# Patient Record
Sex: Male | Born: 1960
Health system: Southern US, Community
[De-identification: ages and names within clinical notes are randomized; demographics above are authoritative.]

## PROBLEM LIST (undated history)

## (undated) DIAGNOSIS — I10 Essential (primary) hypertension: Secondary | ICD-10-CM

## (undated) DIAGNOSIS — G5600 Carpal tunnel syndrome, unspecified upper limb: Secondary | ICD-10-CM

## (undated) DIAGNOSIS — M1990 Unspecified osteoarthritis, unspecified site: Secondary | ICD-10-CM

## (undated) DIAGNOSIS — E119 Type 2 diabetes mellitus without complications: Secondary | ICD-10-CM

## (undated) DIAGNOSIS — K219 Gastro-esophageal reflux disease without esophagitis: Secondary | ICD-10-CM

## (undated) DIAGNOSIS — J449 Chronic obstructive pulmonary disease, unspecified: Secondary | ICD-10-CM

## (undated) DIAGNOSIS — I509 Heart failure, unspecified: Secondary | ICD-10-CM

## (undated) HISTORY — PX: LEG SURGERY: SHX1003

---

## 2014-04-14 ENCOUNTER — Encounter: Payer: Self-pay | Admitting: Physical Medicine & Rehabilitation

## 2017-05-15 ENCOUNTER — Emergency Department (HOSPITAL_COMMUNITY)
Admission: EM | Admit: 2017-05-15 | Discharge: 2017-05-15 | Disposition: A | Discharge status: Home / Self Care | Payer: Self-pay | Attending: Emergency Medicine | Admitting: Emergency Medicine

## 2017-05-15 ENCOUNTER — Encounter (HOSPITAL_COMMUNITY): Payer: Self-pay | Admitting: *Deleted

## 2017-05-15 DIAGNOSIS — R5383 Other fatigue: Secondary | ICD-10-CM | POA: Insufficient documentation

## 2017-05-15 DIAGNOSIS — Z5321 Procedure and treatment not carried out due to patient leaving prior to being seen by health care provider: Secondary | ICD-10-CM | POA: Insufficient documentation

## 2017-05-15 HISTORY — DX: Essential (primary) hypertension: I10

## 2017-05-15 NOTE — ED Notes (Signed)
Informed pt of process, delay and wait time. Pt decided to leave after triage

## 2017-05-15 NOTE — ED Triage Notes (Signed)
Pt c/o fever, dizziness, and fatigue for 2 weeks. Was seen at [facility] yesterday and diagnosed with a UTI. Pt reports taking antibiotics as prescribed.

## 2019-07-21 DIAGNOSIS — J189 Pneumonia, unspecified organism: Secondary | ICD-10-CM

## 2019-07-21 DIAGNOSIS — Z03818 Encounter for observation for suspected exposure to other biological agents ruled out: Secondary | ICD-10-CM

## 2019-07-21 DIAGNOSIS — R0902 Hypoxemia: Secondary | ICD-10-CM

## 2019-07-21 DIAGNOSIS — J90 Pleural effusion, not elsewhere classified: Secondary | ICD-10-CM

## 2019-07-21 DIAGNOSIS — R079 Chest pain, unspecified: Secondary | ICD-10-CM

## 2019-07-21 DIAGNOSIS — I5033 Acute on chronic diastolic (congestive) heart failure: Secondary | ICD-10-CM

## 2019-07-21 DIAGNOSIS — I10 Essential (primary) hypertension: Secondary | ICD-10-CM

## 2019-10-12 DIAGNOSIS — J9601 Acute respiratory failure with hypoxia: Secondary | ICD-10-CM

## 2019-10-12 DIAGNOSIS — E785 Hyperlipidemia, unspecified: Secondary | ICD-10-CM

## 2019-10-12 DIAGNOSIS — I10 Essential (primary) hypertension: Secondary | ICD-10-CM

## 2020-08-08 ENCOUNTER — Emergency Department (HOSPITAL_COMMUNITY): Payer: Medicaid Other

## 2020-08-08 ENCOUNTER — Inpatient Hospital Stay (HOSPITAL_COMMUNITY)
Admission: EM | Admit: 2020-08-08 | Discharge: 2020-08-10 | DRG: 291 | Disposition: A | Discharge status: Home / Self Care | Payer: Medicaid Other | Attending: Internal Medicine | Admitting: Internal Medicine

## 2020-08-08 ENCOUNTER — Other Ambulatory Visit: Payer: Self-pay

## 2020-08-08 ENCOUNTER — Encounter (HOSPITAL_COMMUNITY): Payer: Self-pay | Admitting: Pediatrics

## 2020-08-08 DIAGNOSIS — I110 Hypertensive heart disease with heart failure: Principal | ICD-10-CM | POA: Diagnosis present

## 2020-08-08 DIAGNOSIS — R0600 Dyspnea, unspecified: Secondary | ICD-10-CM

## 2020-08-08 DIAGNOSIS — Z20822 Contact with and (suspected) exposure to covid-19: Secondary | ICD-10-CM | POA: Diagnosis present

## 2020-08-08 DIAGNOSIS — E785 Hyperlipidemia, unspecified: Secondary | ICD-10-CM | POA: Diagnosis present

## 2020-08-08 DIAGNOSIS — J449 Chronic obstructive pulmonary disease, unspecified: Secondary | ICD-10-CM | POA: Diagnosis present

## 2020-08-08 DIAGNOSIS — I209 Angina pectoris, unspecified: Secondary | ICD-10-CM | POA: Diagnosis present

## 2020-08-08 DIAGNOSIS — G8929 Other chronic pain: Secondary | ICD-10-CM | POA: Diagnosis present

## 2020-08-08 DIAGNOSIS — J440 Chronic obstructive pulmonary disease with acute lower respiratory infection: Secondary | ICD-10-CM | POA: Diagnosis present

## 2020-08-08 DIAGNOSIS — J209 Acute bronchitis, unspecified: Secondary | ICD-10-CM | POA: Diagnosis present

## 2020-08-08 DIAGNOSIS — Z7982 Long term (current) use of aspirin: Secondary | ICD-10-CM | POA: Diagnosis not present

## 2020-08-08 DIAGNOSIS — R61 Generalized hyperhidrosis: Secondary | ICD-10-CM | POA: Diagnosis present

## 2020-08-08 DIAGNOSIS — Z79899 Other long term (current) drug therapy: Secondary | ICD-10-CM | POA: Diagnosis not present

## 2020-08-08 DIAGNOSIS — F17210 Nicotine dependence, cigarettes, uncomplicated: Secondary | ICD-10-CM | POA: Diagnosis present

## 2020-08-08 DIAGNOSIS — I160 Hypertensive urgency: Secondary | ICD-10-CM | POA: Diagnosis present

## 2020-08-08 DIAGNOSIS — I10 Essential (primary) hypertension: Secondary | ICD-10-CM

## 2020-08-08 DIAGNOSIS — I509 Heart failure, unspecified: Secondary | ICD-10-CM

## 2020-08-08 DIAGNOSIS — Z8249 Family history of ischemic heart disease and other diseases of the circulatory system: Secondary | ICD-10-CM | POA: Diagnosis not present

## 2020-08-08 DIAGNOSIS — M549 Dorsalgia, unspecified: Secondary | ICD-10-CM | POA: Diagnosis present

## 2020-08-08 DIAGNOSIS — I2510 Atherosclerotic heart disease of native coronary artery without angina pectoris: Secondary | ICD-10-CM | POA: Diagnosis present

## 2020-08-08 DIAGNOSIS — Z720 Tobacco use: Secondary | ICD-10-CM | POA: Diagnosis present

## 2020-08-08 DIAGNOSIS — R079 Chest pain, unspecified: Secondary | ICD-10-CM

## 2020-08-08 DIAGNOSIS — Z23 Encounter for immunization: Secondary | ICD-10-CM | POA: Diagnosis not present

## 2020-08-08 DIAGNOSIS — I77810 Thoracic aortic ectasia: Secondary | ICD-10-CM | POA: Diagnosis present

## 2020-08-08 DIAGNOSIS — I5043 Acute on chronic combined systolic (congestive) and diastolic (congestive) heart failure: Secondary | ICD-10-CM | POA: Diagnosis present

## 2020-08-08 HISTORY — DX: Angina pectoris, unspecified: I20.9

## 2020-08-08 HISTORY — DX: Tobacco use: Z72.0

## 2020-08-08 HISTORY — DX: Heart failure, unspecified: I50.9

## 2020-08-08 HISTORY — DX: Chronic obstructive pulmonary disease, unspecified: J44.9

## 2020-08-08 HISTORY — DX: Essential (primary) hypertension: I10

## 2020-08-08 LAB — TROPONIN I (HIGH SENSITIVITY)
Troponin I (High Sensitivity): 41 ng/L — ABNORMAL HIGH (ref <18)
Troponin I (High Sensitivity): 44 ng/L — ABNORMAL HIGH (ref <18)

## 2020-08-08 LAB — CBC
HCT: 45.4 % (ref 39.0–52.0)
Hemoglobin: 15 g/dL (ref 13.0–17.0)
MCH: 30.7 pg (ref 26.0–34.0)
MCHC: 33 g/dL (ref 30.0–36.0)
MCV: 93 fL (ref 80.0–100.0)
Platelets: 152 10*3/uL (ref 150–400)
RBC: 4.88 MIL/uL (ref 4.22–5.81)
RDW: 13.5 % (ref 11.5–15.5)
WBC: 9.3 10*3/uL (ref 4.0–10.5)
nRBC: 0 % (ref 0.0–0.2)

## 2020-08-08 LAB — BASIC METABOLIC PANEL
Anion gap: 12 (ref 5–15)
BUN: 11 mg/dL (ref 6–20)
CO2: 20 mmol/L — ABNORMAL LOW (ref 22–32)
Calcium: 9.6 mg/dL (ref 8.9–10.3)
Chloride: 103 mmol/L (ref 98–111)
Creatinine, Ser: 0.8 mg/dL (ref 0.61–1.24)
GFR, Estimated: >60 mL/min (ref >60)
Glucose, Bld: 127 mg/dL — ABNORMAL HIGH (ref 70–99)
Potassium: 3.9 mmol/L (ref 3.5–5.1)
Sodium: 135 mmol/L (ref 135–145)

## 2020-08-08 LAB — BRAIN NATRIURETIC PEPTIDE: B Natriuretic Peptide: 428.6 pg/mL — ABNORMAL HIGH (ref 0.0–100.0)

## 2020-08-08 LAB — D-DIMER, QUANTITATIVE: D-Dimer, Quant: 0.86 ug/mL-FEU — ABNORMAL HIGH (ref 0.00–0.50)

## 2020-08-08 LAB — RESPIRATORY PANEL BY RT PCR (FLU A&B, COVID)
Influenza A by PCR: NEGATIVE
Influenza B by PCR: NEGATIVE
SARS Coronavirus 2 by RT PCR: NEGATIVE

## 2020-08-08 MED ORDER — IOHEXOL 350 MG/ML SOLN
100.0000 mL | Freq: Once | INTRAVENOUS | Status: AC | PRN
  Administered 2020-08-08: 100 mL via INTRAVENOUS

## 2020-08-08 MED ORDER — NICOTINE 21 MG/24HR TD PT24
21.0000 mg | MEDICATED_PATCH | Freq: Every day | TRANSDERMAL | Status: DC
  Administered 2020-08-08 – 2020-08-10 (×3): 21 mg via TRANSDERMAL
  Filled 2020-08-08 (×3): qty 1

## 2020-08-08 MED ORDER — METHYLPREDNISOLONE SODIUM SUCC 125 MG IJ SOLR
125.0000 mg | Freq: Once | INTRAMUSCULAR | Status: AC
  Administered 2020-08-08: 125 mg via INTRAVENOUS
  Filled 2020-08-08: qty 2

## 2020-08-08 MED ORDER — ALBUTEROL SULFATE HFA 108 (90 BASE) MCG/ACT IN AERS
2.0000 | INHALATION_SPRAY | Freq: Once | RESPIRATORY_TRACT | Status: AC
  Administered 2020-08-08: 2 via RESPIRATORY_TRACT
  Filled 2020-08-08: qty 6.7

## 2020-08-08 MED ORDER — FUROSEMIDE 10 MG/ML IJ SOLN
40.0000 mg | Freq: Once | INTRAMUSCULAR | Status: AC
  Administered 2020-08-08: 40 mg via INTRAVENOUS
  Filled 2020-08-08: qty 4

## 2020-08-08 NOTE — ED Triage Notes (Signed)
C/O chest pain; endorsed hx of CHF, COPD and HTN.  Stated he was at another facility through out the night and left this morning, but chest pain remains consistent

## 2020-08-08 NOTE — Consult Note (Signed)
Cardiology Consultation:   Patient ID: Cody Carney MRN: [id number]; DOB: 20-Nov-1960  Admit date: 08/08/2020 Date of Consult: 08/08/2020  Primary Care Provider: Patient, [name] [facility] Cardiologist: None (New) [facility] Electrophysiologist:  None    Patient Profile:   Cody Carney is a 59 y.o. male with a hx of chronic CHF, COPD and HTN who is being seen today for the evaluation of chest pain and SOB at the request of Margarita Grizzle, MD.  History of Present Illness:   Cody Carney is a 60yo male with a hx of CHF, COPD and HTN.  He has smoked at least 1-2ppd for years and cut back a few days ago.  He tells me that he had a cardiac cath a year ago in [place] and was told he had no blockages.  He has had CHF in the past but I do not have any prior echoes to review.  He also has COPD with some chronic DOE.  According to a note in Care Everywhere from this summer at [facility] he was supposed to be on Carvedilol, Lisinopril and Lasix for CHF and HTN but currently takes no meds.   He started having problems with SOB over the past 1-2 days along with chest discomfort.  He says that it feels like he cannot take a deep breath in and like an elephant is sitting on his chest.  He has had a few episodes of night sweats. There is no radiation of of his chest discomfort.  He has had a cough productive of some brown sputum.  No fever or chills.  Chest xray showed no active disease.  hsTrop was mildly elevated but flat at 41>44 and BNP mildly elevated at 420.  SCr normal at 0.8.  Cardiology is now asked to consult for evaluation.      Past Medical History:  Diagnosis Date  . CHF (congestive heart failure) (HCC)   . COPD (chronic obstructive pulmonary disease) (HCC)   . Hypertension     Past Surgical History:  Procedure Laterality Date  . LEG SURGERY       Home Medications:  Prior to Admission medications   Not on File    Inpatient Medications: Scheduled Meds:  Continuous Infusions:  PRN  Meds:   Allergies:   No Known Allergies  Social History:   Social History   Socioeconomic History  . Marital status: Single    Spouse name: Not on file  . Number of children: Not on file  . Years of education: Not on file  . Highest education level: Not on file  Occupational History  . Not on file  Tobacco Use  . Smoking status: Current Every Day Smoker  . Smokeless tobacco: Never Used  Substance and Sexual Activity  . Alcohol use: Yes  . Drug use: No  . Sexual activity: Not on file  Other Topics Concern  . Not on file  Social History Narrative  . Not on file   Social Determinants of Health   Financial Resource Strain:   . Difficulty of Paying Living Expenses: Not on file  Food Insecurity:   . Worried About Programme researcher, broadcasting/film/video in the Last Year: Not on file  . Ran Out of Food in the Last Year: Not on file  Transportation Needs:   . Lack of Transportation (Medical): Not on file  . Lack of Transportation (Non-Medical): Not on file  Physical Activity:   . Days of Exercise per Week: Not on file  .  Minutes of Exercise per Session: Not on file  Stress:   . Feeling of Stress : Not on file  Social Connections:   . Frequency of Communication with Friends and Family: Not on file  . Frequency of Social Gatherings with Friends and Family: Not on file  . Attends Religious Services: Not on file  . Active Member of Clubs or Organizations: Not on file  . Attends Banker Meetings: Not on file  . Marital Status: Not on file  Intimate Partner Violence:   . Fear of Current or Ex-Partner: Not on file  . Emotionally Abused: Not on file  . Physically Abused: Not on file  . Sexually Abused: Not on file    Family History:   History reviewed. No pertinent family history.   ROS:  Please see the history of present illness.   All other ROS reviewed and negative.     Physical Exam/Data:   Vitals:   08/08/20 1436 08/08/20 1534 08/08/20 1600 08/08/20 1615  BP: (!)  171/103 (!) 151/89 (!) 146/85 (!) 151/100  Pulse: (!) 108 (!) 107 (!) 107 (!) 105  Resp: 16 15 13 14   Temp: 98.4 F (36.9 C) 99.3 F (37.4 C)    TempSrc: Oral Oral    SpO2: 100% 98% 95% 96%   No intake or output data in the 24 hours ending 08/08/20 1816 No flowsheet data found.   There is no height or weight on file to calculate BMI.  General:  Well nourished, well developed, in no acute distress HEENT: normal Lymph: no adenopathy Neck: no JVD Endocrine:  No thryomegaly Vascular: No carotid bruits; FA pulses 2+ bilaterally without bruits  Cardiac:  normal S1, S2; regular and tachy; no murmur  Lungs:  Scattered rhonchi Abd: soft, nontender, no hepatomegaly  Ext: no edema Musculoskeletal:  No deformities, BUE and BLE strength normal and equal Skin: warm and dry  Neuro:  CNs 2-12 intact, no focal abnormalities noted Psych:  Normal affect   EKG:  The EKG was personally reviewed and demonstrates:  Sinus tachycardia with RAD Telemetry:  Telemetry was personally reviewed and demonstrates:  Sinus tachycardia  Relevant CV Studies: none  Laboratory Data:  High Sensitivity Troponin:   Recent Labs  Lab 08/08/20 1507 08/08/20 1701  TROPONINIHS 41* 44*     Chemistry Recent Labs  Lab 08/08/20 1507  NA 135  K 3.9  CL 103  CO2 20*  GLUCOSE 127*  BUN 11  CREATININE 0.80  CALCIUM 9.6  GFRNONAA >60  ANIONGAP 12    No results for input(s): PROT, ALBUMIN, AST, ALT, ALKPHOS, BILITOT in the last 168 hours. Hematology Recent Labs  Lab 08/08/20 1507  WBC 9.3  RBC 4.88  HGB 15.0  HCT 45.4  MCV 93.0  MCH 30.7  MCHC 33.0  RDW 13.5  PLT 152   BNP Recent Labs  Lab 08/08/20 1508  BNP 428.6*    DDimer  Recent Labs  Lab 08/08/20 1701  DDIMER 0.86*     Radiology/Studies:  DG Chest 2 View  Result Date: 08/08/2020 CLINICAL DATA:  Chest pain. Shortness of breath. History of congestive heart failure. EXAM: CHEST - 2 VIEW COMPARISON:  August 08, 2020 FINDINGS: The  heart, hila, and mediastinum are normal. No pulmonary nodules or masses. No focal infiltrates or overt edema. IMPRESSION: No active cardiopulmonary disease. Electronically Signed   By: August 10, 2020 III M.D   On: 08/08/2020 15:05     Assessment and Plan:   1.  Chest pain -unclear etiology>>ischemic vs. Respiratory vs. CHF -he tells me he had cath a year ago with no obstructive disease in [place]>>no records at this time -hsTrop minimally elevated and flat trend that can be seen in demand ischemia -EKG with nonspecific ST abnormality -awaiting ddimer to rule out PE -he has had cough productive of brown sputum as well as worsening CP with deep breathing that could suggest acute COPD exacerbation -CP could also be due to CHF exacerbation -BP markedly elevated with intact pulses throughout so unlikely to be due to aortic dissection but could be demand ischemia in the setting of demand ischemia -continue to cycle enzymes -check 2D echo to assess LVF and rule out RWMAs -will try to get a copy of cardiac cath from [place]  2.  SOB -this has worsened over the past few days -he has a hx of COPD now with cough and productive sputum -he is tachycardic and ddimer pending to rule out acute PE -BNp elevated and I suspect he has some mild CHF -if ddimer neg then will treat with IV Lasix for mild CHF exacerbation -TRH to admit and recommend treatment for possible COPD exacerbation  3. HTN -BP poorly controlled -he is on carvedilol, Lisinopril and lasix and has been taking them with no missed doses -will have pharmacy verify doses with outside pharmacy -titrate ACE I as BP tolerates -consider addition of spiro  4.  Chronic CHF -unclear if diastolic or systolic as no prior echo to review -he is on carvedilol 25mg  BID, Lasix 40mg  daily and Lisinopril 5mg  daily per notes from [facility] in July 2021 and he confirms he has been taking his meds with no missed doses -uptitrate meds for BP control -if ddimer  normal then would treat for CHF and start Lasix 40mg  IV BID -follow daily I&Os, renal function and daily weights -check 2D echo      HEAR Score (for undifferentiated chest pain):  HEAR Score: 4   New York Heart Association (NYHA) Functional Class NYHA Class II        For questions or updates, please contact [facility] Please consult [email or web address] for contact info under    Signed, , MD  08/08/2020 6:16 PM

## 2020-08-08 NOTE — ED Notes (Signed)
Pt resting in bed. NADN 

## 2020-08-08 NOTE — H&P (Signed)
History and Physical   Cody Carney WUJ:[id number] DOB: 19-Aug-1961 DOA: 08/08/2020  Referring MD/NP/PA: Dr Rosalia Hammers  PCP: Patient, [name]   Outpatient Specialists: None   Patient coming from: Home  Chief Complaint: Chest Pain  HPI: Cody Carney is a 59 y.o. male with medical history significant of CHF, COPD, hypertension and tobacco abuse who apparently was being seen in [place] prior to coming here.  Patient here today with substernal chest pain rated a 6 out of 10.  More of pressure.  No diaphoresis.  Associated with some mild nausea.  Patient seen and evaluated.  He is found to have mildly elevated troponin in the 40s.  He also has no significant EKG change.  Patient was seen by cardiology.  It appears he has acute exacerbation of CHF.  His EF is unknown as we have no prior records of echo in the system.  He is chronically on Lasix.  He appears to be fluid overloaded.  We will admit the patient for evaluation of his chest pain as well as CHF.  Cardiology to follow..  ED Course: Temperature 99.3 blood pressure 180/96 pulse 150 respiratory rate of 18 oxygen sat 92% on room air.  CBC and chemistry appear to be within normal except for CO2 of 20.  COVID-19 screen is negative initial troponin is 44 D-dimer 0.86.  CT angiogram of the chest shows no evidence of PE but stable mediastinal and right hilar lymphadenopathy.  BNP is 426.  Cardiology has seen patient and recommends inpatient admission.  Review of Systems: As per HPI otherwise 10 point review of systems negative.    Past Medical History:  Diagnosis Date  . CHF (congestive heart failure) (HCC)   . COPD (chronic obstructive pulmonary disease) (HCC)   . Hypertension     Past Surgical History:  Procedure Laterality Date  . LEG SURGERY       reports that he has been smoking. He has never used smokeless tobacco. He reports current alcohol use. He reports that he does not use drugs.  No Known Allergies  History reviewed. No pertinent  family history.   Prior to Admission medications   Medication Sig Start Date End Date Taking? Authorizing Provider  aspirin 81 MG EC tablet Take 81 mg by mouth daily.   Yes [provider]  atorvastatin (LIPITOR) 20 MG tablet Take 20 mg by mouth daily.   Yes [provider]  carvedilol (COREG) 25 MG tablet Take 25 mg by mouth in the morning and at bedtime. 05/04/20  Yes [provider]  furosemide (LASIX) 40 MG tablet Take 40 mg by mouth daily. 05/04/20  Yes [provider]  lisinopril (ZESTRIL) 5 MG tablet Take 5 mg by mouth daily. 05/04/20  Yes [provider]    Physical Exam: Vitals:   08/08/20 1815 08/08/20 1915 08/08/20 1945 08/08/20 2112  BP: (!) 152/102 (!) 155/98 (!) 149/90 (!) 140/93  Pulse: (!) 111 (!) 102 (!) 108 (!) 115  Resp:    18  Temp:      TempSrc:      SpO2: 95% 96% 95% 98%      Constitutional: Acutely ill looking, mild distress Vitals:   08/08/20 1815 08/08/20 1915 08/08/20 1945 08/08/20 2112  BP: (!) 152/102 (!) 155/98 (!) 149/90 (!) 140/93  Pulse: (!) 111 (!) 102 (!) 108 (!) 115  Resp:    18  Temp:      TempSrc:      SpO2: 95% 96% 95%  98%   Eyes: PERRL, lids and conjunctivae normal ENMT: Mucous membranes are moist. Posterior pharynx clear of any exudate or lesions.Normal dentition.  Neck: normal, supple, no masses, no thyromegaly Respiratory: Coarse breath sounds bilaterally, mild expiratory wheezing and some rhonchi, no crackles. Normal respiratory effort. No accessory muscle use.  Cardiovascular: Sinus tachycardia, no murmurs / rubs / gallops. No extremity edema. 2+ pedal pulses. No carotid bruits.  Abdomen: no tenderness, no masses palpated. No hepatosplenomegaly. Bowel sounds positive.  Musculoskeletal: no clubbing / cyanosis. No joint deformity upper and lower extremities. Good ROM, no contractures. Normal muscle tone.  Skin: no rashes, lesions, ulcers. No induration Neurologic: CN 2-12 grossly intact.  Sensation intact, DTR normal. Strength 5/5 in all 4.  Psychiatric: Normal judgment and insight. Alert and oriented x 3. Normal mood.     Labs on Admission: I have personally reviewed following labs and imaging studies  CBC: Recent Labs  Lab 08/08/20 1507  WBC 9.3  HGB 15.0  HCT 45.4  MCV 93.0  PLT 152   Basic Metabolic Panel: Recent Labs  Lab 08/08/20 1507  NA 135  K 3.9  CL 103  CO2 20*  GLUCOSE 127*  BUN 11  CREATININE 0.80  CALCIUM 9.6   GFR: CrCl cannot be calculated (Unknown ideal weight.). Liver Function Tests: No results for input(s): AST, ALT, ALKPHOS, BILITOT, PROT, ALBUMIN in the last 168 hours. No results for input(s): LIPASE, AMYLASE in the last 168 hours. No results for input(s): AMMONIA in the last 168 hours. Coagulation Profile: No results for input(s): INR, PROTIME in the last 168 hours. Cardiac Enzymes: No results for input(s): CKTOTAL, CKMB, CKMBINDEX, TROPONINI in the last 168 hours. BNP (last 3 results) No results for input(s): PROBNP in the last 8760 hours. HbA1C: No results for input(s): HGBA1C in the last 72 hours. CBG: No results for input(s): GLUCAP in the last 168 hours. Lipid Profile: No results for input(s): CHOL, HDL, LDLCALC, TRIG, CHOLHDL, LDLDIRECT in the last 72 hours. Thyroid Function Tests: No results for input(s): TSH, T4TOTAL, FREET4, T3FREE, THYROIDAB in the last 72 hours. Anemia Panel: No results for input(s): VITAMINB12, FOLATE, FERRITIN, TIBC, IRON, RETICCTPCT in the last 72 hours. Urine analysis: No results found for: COLORURINE, APPEARANCEUR, LABSPEC, PHURINE, GLUCOSEU, HGBUR, BILIRUBINUR, KETONESUR, PROTEINUR, UROBILINOGEN, NITRITE, LEUKOCYTESUR Sepsis Labs: @LABRCNTIP (procalcitonin:4,lacticidven:4) ) Recent Results (from the past 240 hour(s))  Respiratory Panel by RT PCR (Flu A&B, Covid) - Nasopharyngeal Swab     Status: None   Collection Time: 08/08/20  4:23 PM   Specimen: Nasopharyngeal Swab  Result Value Ref  Range Status   SARS Coronavirus 2 by RT PCR NEGATIVE NEGATIVE Final    Comment: (NOTE) SARS-CoV-2 target nucleic acids are NOT DETECTED.  The SARS-CoV-2 RNA is generally detectable in upper respiratoy specimens during the acute phase of infection. The lowest concentration of SARS-CoV-2 viral copies this assay can detect is 131 copies/mL. A negative result does not preclude SARS-Cov-2 infection and should not be used as the sole basis for treatment or other patient management decisions. A negative result may occur with  improper specimen collection/handling, submission of specimen other than nasopharyngeal swab, presence of viral mutation(s) within the areas targeted by this assay, and inadequate number of viral copies (<131 copies/mL). A negative result must be combined with clinical observations, patient history, and epidemiological information. The expected result is Negative.  Fact Sheet for Patients:  08/10/20  Fact Sheet for Healthcare Providers:  https://www.moore.com/  This test is no t yet approved or  cleared by the [place] and  has been authorized for detection and/or diagnosis of SARS-CoV-2 by FDA under an Emergency Use Authorization (EUA). This EUA will remain  in effect (meaning this test can be used) for the duration of the COVID-19 declaration under Section 564(b)(1) of the Act, 21 U.S.C. section 360bbb-3(b)(1), unless the authorization is terminated or revoked sooner.     Influenza A by PCR NEGATIVE NEGATIVE Final   Influenza B by PCR NEGATIVE NEGATIVE Final    Comment: (NOTE) The Xpert Xpress SARS-CoV-2/FLU/RSV assay is intended as an aid in  the diagnosis of influenza from Nasopharyngeal swab specimens and  should not be used as a sole basis for treatment. Nasal washings and  aspirates are unacceptable for Xpert Xpress SARS-CoV-2/FLU/RSV  testing.  Fact Sheet for  Patients: https://www.moore.com/  Fact Sheet for Healthcare Providers: https://www.young.biz/  This test is not yet approved or cleared by the [place] FDA and  has been authorized for detection and/or diagnosis of SARS-CoV-2 by  FDA under an Emergency Use Authorization (EUA). This EUA will remain  in effect (meaning this test can be used) for the duration of the  Covid-19 declaration under Section 564(b)(1) of the Act, 21  U.S.C. section 360bbb-3(b)(1), unless the authorization is  terminated or revoked. Performed at [facility], [address], [place], [place] [address]      Radiological Exams on Admission: DG Chest 2 View  Result Date: 08/08/2020 CLINICAL DATA:  Chest pain. Shortness of breath. History of congestive heart failure. EXAM: CHEST - 2 VIEW COMPARISON:  August 08, 2020 FINDINGS: The heart, hila, and mediastinum are normal. No pulmonary nodules or masses. No focal infiltrates or overt edema. IMPRESSION: No active cardiopulmonary disease. Electronically Signed   By: Gerome Sam III M.D   On: 08/08/2020 15:05   CT Angio Chest PE W/Cm &/Or Wo Cm  Result Date: 08/08/2020 CLINICAL DATA:  Elevated D-dimer. EXAM: CT ANGIOGRAPHY CHEST WITH CONTRAST TECHNIQUE: Multidetector CT imaging of the chest was performed using the standard protocol during bolus administration of intravenous contrast. Multiplanar CT image reconstructions and MIPs were obtained to evaluate the vascular anatomy. CONTRAST:  OMNIPAQUE IOHEXOL 350 MG/ML SOLN COMPARISON:  October 12, 2019 FINDINGS: Cardiovascular: There is moderate severity calcification of the aortic arch and descending thoracic aorta. Satisfactory opacification of the pulmonary arteries to the segmental level. No evidence of pulmonary embolism. Normal heart size. No pericardial effusion. Mediastinum/Nodes: Stable 1.8 cm x 1.7 cm and 2.5 cm x 1.1 cm pretracheal lymph nodes are seen.  Additional stable 1.5 cm x 1.0 cm and 2.4 cm x 1.4 cm AP window lymph nodes are also noted. Mild right hilar lymphadenopathy is present. Thyroid gland, trachea, and esophagus demonstrate no significant findings. Lungs/Pleura: Lungs are clear. No pleural effusion or pneumothorax. Upper Abdomen: No acute abnormality. Musculoskeletal: Multilevel degenerative changes seen throughout the thoracic spine. Review of the MIP images confirms the above findings. IMPRESSION: 1. No evidence of pulmonary embolus. 2. Stable mediastinal and right hilar lymphadenopathy. 3. Aortic atherosclerosis. Aortic Atherosclerosis (ICD10-I70.0). Electronically Signed   By: Aram Candela M.D.   On: 08/08/2020 20:43    EKG: Independently reviewed.  It shows sinus tachycardia with low voltage EKG.  Probable right axis deviation and left atrial enlargement.  Assessment/Plan Principal Problem:   Acute on chronic combined systolic and diastolic CHF (congestive heart failure) (HCC) Active Problems:   COPD (chronic obstructive pulmonary disease) (HCC)   Essential hypertension   Ischemic chest pain (  HCC)   Tobacco abuse   Acute exacerbation of CHF (congestive heart failure) (HCC)     #1 chest pain: Appears to be angina type.  Patient has cardiac history.  We will admit the patient cycle enzymes to rule out MI.  We will continue with aspirin and statin.  Nitroglycerin also as tolerated.  Patient may be having demand ischemia leading to his exertional dyspnea.  #2 acute CHF versus COPD exacerbation: Patient appears to be symptomatic with worsening CHF.  At this point EF is unknown.  We will get echocardiogram.  Depending on the results cardiology may likely adjust his medications.  #3 essential hypertension: Confirm on resume home regimen.  #4 COPD: May have mild exacerbation.  Complained of some mild cough was some sputum production which has been on and off for long time.  Continue monitoring.  #5 tobacco abuse: We will add  nicotine patch.     DVT prophylaxis: Lovenox Code Status: Full code Family Communication: No family at bedside Disposition Plan: Home Consults called: Dr. Mayford Knife, cardiology Admission status: Inpatient  Severity of Illness: The appropriate patient status for this patient is INPATIENT. Inpatient status is judged to be reasonable and necessary in order to provide the required intensity of service to ensure the patient's safety. The patient's presenting symptoms, physical exam findings, and initial radiographic and laboratory data in the context of their chronic comorbidities is felt to place them at high risk for further clinical deterioration. Furthermore, it is not anticipated that the patient will be medically stable for discharge from the hospital within 2 midnights of admission. The following factors support the patient status of inpatient.   " The patient's presenting symptoms include chest pain and shortness of breath. " The worrisome physical exam findings include sinus tachycardia no JVD. " The initial radiographic and laboratory data are worrisome because of elevated troponin. " The chronic co-morbidities include CHF and COPD.   * I certify that at the point of admission it is my clinical judgment that the patient will require inpatient hospital care spanning beyond 2 midnights from the point of admission due to high intensity of service, high risk for further deterioration and high frequency of surveillance required.Lonia Blood MD [facility] Pager [phone]  If 7PM-7AM, please contact night-coverage [email or web address] Password [facility]  08/08/2020, 9:33 PM

## 2020-08-08 NOTE — ED Provider Notes (Signed)
[facility] EMERGENCY DEPARTMENT Provider Note   CSN: [id number] Arrival date & time: 08/08/20  1429     History Chief Complaint  Patient presents with   Chest Pain    Cody Carney is a 59 y.o. male with PMHx HTN, CHF with EF 45%, COPD who presents to the ED today with complaint of gradual onset, constant, diffuse chest pressure x 3 days. Pt reports it feels as if an elephant is sitting on his chest. He also complains of shortness of breath that started yesterday. He went to another ED in [place], [place] last night and was in the waiting room for most of the night. He eventually had to leave as his daughter needed to go home to take care of her children. He presents here today with continued pain. He has nausea and 1 episode of NBNB emesis earlier today after drinking coffee. Pt also reports diarrhea earlier in the week that has since resolved. Pt reports history of CHF and states that whenever he has shortness of breath it is typically related to fluid overload - he has increased his usual lasix from 20 mg BID to 40 mg BID for the past 3 days without relief. He does mention the chest heaviness is not typical for his CHF exacerbations however. Pt is fully vaccinated against COVID with a booster shot approximately 1 month ago. Denies any recent sick contacts. He is a current everyday smoker - has a 36 year pack history. He has + Fhx of CAD with paternal uncles in their 51's and 59's. No personal history of MI. Pt denies any fevers, chills, cough, diaphoresis, abdominal pain, leg swelling, or any other associated symptoms. No hx DVT/PE. No exogenous hormone use. No active malignancy. No recent prolonged travel or immobilization. No hemoptysis.   The history is provided by the patient and medical records.    HPI: A 59 year old patient with a history of hypertension presents for evaluation of chest pain. Initial onset of pain was approximately 1-3 hours ago. The patient's chest pain  is described as heaviness/pressure/tightness and is not worse with exertion. The patient's chest pain is middle- or left-sided, is not well-localized, is not sharp and does not radiate to the arms/jaw/neck. The patient does not complain of nausea and denies diaphoresis. The patient has smoked in the past 90 days. The patient has no history of stroke, has no history of peripheral artery disease, denies any history of treated diabetes, has no relevant family history of coronary artery disease (first degree relative at less than age 85), has no history of hypercholesterolemia and does not have an elevated BMI (>=30).   Past Medical History:  Diagnosis Date   CHF (congestive heart failure) (HCC)    COPD (chronic obstructive pulmonary disease) (HCC)    Hypertension     There are no problems to display for this patient.   Past Surgical History:  Procedure Laterality Date   LEG SURGERY         History reviewed. No pertinent family history.  Social History   Tobacco Use   Smoking status: Current Every Day Smoker   Smokeless tobacco: Never Used  Substance Use Topics   Alcohol use: Yes   Drug use: No    Home Medications Prior to Admission medications   Medication Sig Start Date End Date Taking? Authorizing Provider  aspirin 81 MG EC tablet Take 81 mg by mouth daily.   Yes [provider]  atorvastatin (LIPITOR) 20 MG tablet Take  20 mg by mouth daily.   Yes [provider]  carvedilol (COREG) 25 MG tablet Take 25 mg by mouth in the morning and at bedtime. 05/04/20  Yes [provider]  furosemide (LASIX) 40 MG tablet Take 40 mg by mouth daily. 05/04/20  Yes [provider]  lisinopril (ZESTRIL) 5 MG tablet Take 5 mg by mouth daily. 05/04/20  Yes [provider]    Allergies    Patient has no known allergies.  Review of Systems   Review of Systems  Constitutional: Negative for chills and fever.  Respiratory: Positive for shortness  of breath.   Cardiovascular: Positive for chest pain. Negative for leg swelling.  Gastrointestinal: Positive for diarrhea (resolved), nausea and vomiting. Negative for abdominal pain.  All other systems reviewed and are negative.   Physical Exam Updated Vital Signs BP (!) 151/100    Pulse (!) 105    Temp 99.3 F (37.4 C) (Oral)    Resp 14    SpO2 96%   Physical Exam Vitals and nursing note reviewed.  Constitutional:      Appearance: He is not ill-appearing or diaphoretic.     Comments: Pt without mask on as I enter the room as it is worsening his shortness of breath however with mask on he is able to speak in short sentences. Satting 96% on RA.   HENT:     Head: Normocephalic and atraumatic.  Eyes:     Conjunctiva/sclera: Conjunctivae normal.  Cardiovascular:     Rate and Rhythm: Regular rhythm. Tachycardia present.     Pulses:          Radial pulses are 2+ on the right side and 2+ on the left side.       Dorsalis pedis pulses are 2+ on the right side and 2+ on the left side.     Heart sounds: Normal heart sounds.  Pulmonary:     Effort: Pulmonary effort is normal.     Breath sounds: Normal breath sounds. No decreased breath sounds, wheezing, rhonchi or rales.  Chest:     Chest wall: No tenderness.  Abdominal:     Palpations: Abdomen is soft.     Tenderness: There is no abdominal tenderness. There is no guarding or rebound.  Musculoskeletal:     Cervical back: Neck supple.     Right lower leg: No edema.     Left lower leg: No edema.  Skin:    General: Skin is warm and dry.  Neurological:     Mental Status: He is alert.     ED Results / Procedures / Treatments   Labs (all labs ordered are listed, but only abnormal results are displayed) Labs Reviewed  BASIC METABOLIC PANEL - Abnormal; Notable for the following components:      Result Value   CO2 20 (*)    Glucose, Bld 127 (*)    All other components within normal limits  BRAIN NATRIURETIC PEPTIDE - Abnormal;  Notable for the following components:   B Natriuretic Peptide 428.6 (*)    All other components within normal limits  D-DIMER, QUANTITATIVE (NOT AT [facility]) - Abnormal; Notable for the following components:   D-Dimer, Quant 0.86 (*)    All other components within normal limits  TROPONIN I (HIGH SENSITIVITY) - Abnormal; Notable for the following components:   Troponin I (High Sensitivity) 41 (*)    All other components within normal limits  TROPONIN I (HIGH SENSITIVITY) - Abnormal; Notable for the following  components:   Troponin I (High Sensitivity) 44 (*)    All other components within normal limits  RESPIRATORY PANEL BY RT PCR (FLU A&B, COVID)  CBC    EKG EKG Interpretation  Date/Time:  Sunday August 08 2020 15:47:34 EDT Ventricular Rate:  109 PR Interval:  156 QRS Duration: 94 QT Interval:  360 QTC Calculation: 485 R Axis:   101 Text Interpretation: Sinus tachycardia Probable left atrial enlargement Right axis deviation Borderline prolonged QT interval Confirmed by Ray, Danielle ([id number]) on 08/08/2020 4:24:34 PM   Radiology DG Chest 2 View  Result Date: 08/08/2020 CLINICAL DATA:  Chest pain. Shortness of breath. History of congestive heart failure. EXAM: CHEST - 2 VIEW COMPARISON:  August 08, 2020 FINDINGS: The heart, hila, and mediastinum are normal. No pulmonary nodules or masses. No focal infiltrates or overt edema. IMPRESSION: No active cardiopulmonary disease. Electronically Signed   By: David  Williams III M.D   On: 08/08/2020 15:05   CT Angio Chest PE W/Cm &/Or Wo Cm  Result Date: 08/08/2020 CLINICAL DATA:  Elevated D-dimer. EXAM: CT ANGIOGRAPHY CHEST WITH CONTRAST TECHNIQUE: Multidetector CT imaging of the chest was performed using the standard protocol during bolus administration of intravenous contrast. Multiplanar CT image reconstructions and MIPs were obtained to evaluate the vascular anatomy. CONTRAST:  100mL OMNIPAQUE IOHEXOL 350 MG/ML SOLN COMPARISON:  October 12, 2019 FINDINGS: Cardiovascular: There is moderate severity calcification of the aortic arch and descending thoracic aorta. Satisfactory opacification of the pulmonary arteries to the segmental level. No evidence of pulmonary embolism. Normal heart size. No pericardial effusion. Mediastinum/Nodes: Stable 1.8 cm x 1.7 cm and 2.5 cm x 1.1 cm pretracheal lymph nodes are seen. Additional stable 1.5 cm x 1.0 cm and 2.4 cm x 1.4 cm AP window lymph nodes are also noted. Mild right hilar lymphadenopathy is present. Thyroid gland, trachea, and esophagus demonstrate no significant findings. Lungs/Pleura: Lungs are clear. No pleural effusion or pneumothorax. Upper Abdomen: No acute abnormality. Musculoskeletal: Multilevel degenerative changes seen throughout the thoracic spine. Review of the MIP images confirms the above findings. IMPRESSION: 1. No evidence of pulmonary embolus. 2. Stable mediastinal and right hilar lymphadenopathy. 3. Aortic atherosclerosis. Aortic Atherosclerosis (ICD10-I70.0). Electronically Signed   By: Thaddeus  Houston M.D.   On: 08/08/2020 20:43    Procedures Procedures (including critical care time)  Medications Ordered in ED Medications  iohexol (OMNIPAQUE) 350 MG/ML injection 100 mL (100 mLs Intravenous Contrast Given 08/08/20 2026)  methylPREDNISolone sodium succinate (SOLU-MEDROL) 125 mg/2 mL injection 125 mg (125 mg Intravenous Given 08/08/20 2057)  albuterol (VENTOLIN HFA) 108 (90 Base) MCG/ACT inhaler 2 puff (2 puffs Inhalation Given 08/08/20 2055)  furosemide (LASIX) injection 40 mg (40 mg Intravenous Given 08/08/20 2056)    ED Course  I have reviewed the triage vital signs and the nursing notes.  Pertinent labs & imaging results that were available during my care of the patient were reviewed by me and considered in my medical decision making (see chart for details).  Clinical Course as of Aug 09 2119  Sun Aug 08, 2020  1731 D-Dimer, Quant(!): 0.86 [MV]    Clinical Course  User Index [MV] Kaleigh Spiegelman, PA-C   MDM Rules/Calculators/A&P HEAR Score: 4                        58  year old male presents to the ED today with chest heaviness for the past 3 days with new shortness of breath that began last night.   to an ED in [place] however left prior to being seen due to needing to get home.  He is presenting today with continued pain, new onset of nausea and vomiting earlier this morning.  Patient did have diarrhea earlier last week.  On arrival to the ED patient is afebrile at 98.4, tachycardic in the 110s, blood pressure elevated 171/103, nontachypneic.  Satting 100% on room air.  EKG with sinus tachycardia, no ischemic changes.  Repeat vitals an hour after patient presented with a temperature of 99.3, continues to be tachycardic.  Patient denies any recent sick contacts, he is fully vaccinated against Covid with booster shot 1 month ago.  An x-ray was obtained prior to patient being seen without any abnormalities.  He is able to speak in short sentences without difficulty, his lungs are clear to auscultation bilaterally.  No concern for COPD exacerbation at this time.  He does have a history of CHF; no records in our system. reports he has had to increase his Lasix from 20 mg twice daily to 40 mg twice daily for the past 3 days without relief.  BNP today 428.6, will need to find additional lab work to see what patient's baseline is.  Troponin of 41.  We will plan to repeat troponin.  Will add on D-dimer at this time given tachycardia and symptoms however he has no other risk factors besides age at this time for PE.  Will also swab for Covid at this time.   D dimer elevated at 0.86. Will need CTA at this time.  Repeat troponin 44  Cardiologist Dr. Mayford Knife came to evaluate patient - she saw he was in the ED with complaint of chest pain and slight elevation in troponin. See consult note for further recommendations; feels he needs to be admitted for chest pain work up.    CTA has returned negative for PE at this time. Pt continued to be tachycardic. Will treat for CHF exacerbation with IV lasix per Dr. Norris Cross recommendations. Have also provided solumedrol and albuterol inhaler however appaers to be more CHF exac vs COPD exac.   Discussed case with Dr. Mikeal Hawthorne with [facility] who agrees to evaluate patient for admission.   This note was prepared using Dragon voice recognition software and may include unintentional dictation errors due to the inherent limitations of voice recognition software.   Final Clinical Impression(s) / ED Diagnoses Final diagnoses:  Acute on chronic congestive heart failure, unspecified heart failure type (HCC)  Nonspecific chest pain    Rx / DC Orders ED Discharge Orders    None       Tanda Rockers, PA-C 08/08/20 2120    Margarita Grizzle, MD 08/09/20 1427

## 2020-08-09 ENCOUNTER — Inpatient Hospital Stay (HOSPITAL_COMMUNITY): Payer: Medicaid Other

## 2020-08-09 DIAGNOSIS — Z720 Tobacco use: Secondary | ICD-10-CM

## 2020-08-09 DIAGNOSIS — J440 Chronic obstructive pulmonary disease with acute lower respiratory infection: Secondary | ICD-10-CM

## 2020-08-09 DIAGNOSIS — I5043 Acute on chronic combined systolic (congestive) and diastolic (congestive) heart failure: Secondary | ICD-10-CM

## 2020-08-09 DIAGNOSIS — I5021 Acute systolic (congestive) heart failure: Secondary | ICD-10-CM

## 2020-08-09 DIAGNOSIS — J209 Acute bronchitis, unspecified: Secondary | ICD-10-CM

## 2020-08-09 DIAGNOSIS — I700 Atherosclerosis of aorta: Secondary | ICD-10-CM

## 2020-08-09 LAB — CBC WITH DIFFERENTIAL/PLATELET
Abs Immature Granulocytes: 0.01 10*3/uL (ref 0.00–0.07)
Basophils Absolute: 0 10*3/uL (ref 0.0–0.1)
Basophils Relative: 0 %
Eosinophils Absolute: 0 10*3/uL (ref 0.0–0.5)
Eosinophils Relative: 0 %
HCT: 46.7 % (ref 39.0–52.0)
Hemoglobin: 15.7 g/dL (ref 13.0–17.0)
Immature Granulocytes: 0 %
Lymphocytes Relative: 9 %
Lymphs Abs: 0.3 10*3/uL — ABNORMAL LOW (ref 0.7–4.0)
MCH: 31.5 pg (ref 26.0–34.0)
MCHC: 33.6 g/dL (ref 30.0–36.0)
MCV: 93.8 fL (ref 80.0–100.0)
Monocytes Absolute: 0.1 10*3/uL (ref 0.1–1.0)
Monocytes Relative: 1 %
Neutro Abs: 3.4 10*3/uL (ref 1.7–7.7)
Neutrophils Relative %: 90 %
Platelets: 137 10*3/uL — ABNORMAL LOW (ref 150–400)
RBC: 4.98 MIL/uL (ref 4.22–5.81)
RDW: 13.4 % (ref 11.5–15.5)
WBC: 3.8 10*3/uL — ABNORMAL LOW (ref 4.0–10.5)
nRBC: 0 % (ref 0.0–0.2)

## 2020-08-09 LAB — CBC
HCT: 47.7 % (ref 39.0–52.0)
Hemoglobin: 15.9 g/dL (ref 13.0–17.0)
MCH: 30.9 pg (ref 26.0–34.0)
MCHC: 33.3 g/dL (ref 30.0–36.0)
MCV: 92.8 fL (ref 80.0–100.0)
Platelets: 167 10*3/uL (ref 150–400)
RBC: 5.14 MIL/uL (ref 4.22–5.81)
RDW: 13.4 % (ref 11.5–15.5)
WBC: 6.1 10*3/uL (ref 4.0–10.5)
nRBC: 0 % (ref 0.0–0.2)

## 2020-08-09 LAB — CREATININE, SERUM
Creatinine, Ser: 0.95 mg/dL (ref 0.61–1.24)
GFR, Estimated: >60 mL/min (ref >60)

## 2020-08-09 LAB — HIV ANTIBODY (ROUTINE TESTING W REFLEX): HIV Screen 4th Generation wRfx: NONREACTIVE

## 2020-08-09 LAB — ECHOCARDIOGRAM COMPLETE
Area-P 1/2: 3.74 cm2
Calc EF: 35.8 %
Height: 68 in
S' Lateral: 4.62 cm
Single Plane A2C EF: 33.8 %
Single Plane A4C EF: 29.4 %
Weight: 3126.4 oz

## 2020-08-09 MED ORDER — FUROSEMIDE 10 MG/ML IJ SOLN
40.0000 mg | Freq: Two times a day (BID) | INTRAMUSCULAR | Status: DC
  Administered 2020-08-09 (×2): 40 mg via INTRAVENOUS
  Filled 2020-08-09 (×2): qty 4

## 2020-08-09 MED ORDER — INFLUENZA VAC SPLIT QUAD 0.5 ML IM SUSY
0.5000 mL | PREFILLED_SYRINGE | INTRAMUSCULAR | Status: AC
  Administered 2020-08-10: 0.5 mL via INTRAMUSCULAR
  Filled 2020-08-09: qty 0.5

## 2020-08-09 MED ORDER — SODIUM CHLORIDE 0.9% FLUSH
3.0000 mL | Freq: Two times a day (BID) | INTRAVENOUS | Status: DC
  Administered 2020-08-09 – 2020-08-10 (×4): 3 mL via INTRAVENOUS

## 2020-08-09 MED ORDER — CARVEDILOL 25 MG PO TABS
25.0000 mg | ORAL_TABLET | Freq: Two times a day (BID) | ORAL | Status: DC
  Administered 2020-08-09 – 2020-08-10 (×3): 25 mg via ORAL
  Filled 2020-08-09 (×3): qty 1

## 2020-08-09 MED ORDER — LISINOPRIL 5 MG PO TABS
5.0000 mg | ORAL_TABLET | Freq: Every day | ORAL | Status: DC
  Administered 2020-08-09: 5 mg via ORAL
  Filled 2020-08-09: qty 1

## 2020-08-09 MED ORDER — LISINOPRIL 10 MG PO TABS
10.0000 mg | ORAL_TABLET | Freq: Every day | ORAL | Status: DC
  Administered 2020-08-10: 10 mg via ORAL
  Filled 2020-08-09: qty 1

## 2020-08-09 MED ORDER — ASPIRIN EC 81 MG PO TBEC
81.0000 mg | DELAYED_RELEASE_TABLET | Freq: Every day | ORAL | Status: DC
  Administered 2020-08-09 – 2020-08-10 (×2): 81 mg via ORAL
  Filled 2020-08-09 (×2): qty 1

## 2020-08-09 MED ORDER — ENOXAPARIN SODIUM 40 MG/0.4ML ~~LOC~~ SOLN
40.0000 mg | SUBCUTANEOUS | Status: DC
  Administered 2020-08-09 – 2020-08-10 (×2): 40 mg via SUBCUTANEOUS
  Filled 2020-08-09 (×2): qty 0.4

## 2020-08-09 MED ORDER — DOXYCYCLINE HYCLATE 100 MG PO TABS
100.0000 mg | ORAL_TABLET | Freq: Two times a day (BID) | ORAL | Status: DC
  Administered 2020-08-09 – 2020-08-10 (×3): 100 mg via ORAL
  Filled 2020-08-09 (×3): qty 1

## 2020-08-09 MED ORDER — SODIUM CHLORIDE 0.9 % IV SOLN: 250.0000 mL | INTRAVENOUS | Status: DC | PRN

## 2020-08-09 MED ORDER — SODIUM CHLORIDE 0.9% FLUSH: 3.0000 mL | INTRAVENOUS | Status: DC | PRN

## 2020-08-09 MED ORDER — ACETAMINOPHEN 325 MG PO TABS: 650.0000 mg | ORAL_TABLET | ORAL | Status: DC | PRN

## 2020-08-09 MED ORDER — ATORVASTATIN CALCIUM 10 MG PO TABS
20.0000 mg | ORAL_TABLET | Freq: Every day | ORAL | Status: DC
  Administered 2020-08-09 – 2020-08-10 (×2): 20 mg via ORAL
  Filled 2020-08-09 (×2): qty 2

## 2020-08-09 MED ORDER — ONDANSETRON HCL 4 MG/2ML IJ SOLN: 4.0000 mg | Freq: Four times a day (QID) | INTRAMUSCULAR | Status: DC | PRN

## 2020-08-09 MED ORDER — LISINOPRIL 5 MG PO TABS
5.0000 mg | ORAL_TABLET | Freq: Once | ORAL | Status: AC
  Administered 2020-08-09: 5 mg via ORAL
  Filled 2020-08-09: qty 1

## 2020-08-09 NOTE — Progress Notes (Addendum)
Progress Note  Patient Name: Cody Carney Date of Encounter: 08/09/2020  Primary Cardiologist: No primary care provider on file.   Subjective   59 yo M hx of HF NOS, HTN urgency, Aortic atherosclerosis with Coronary artery Calcifications, Tobacco Abuse, COPD admitted with SOB with elevated BNP and HTN.  Overnight diuresed with likely net negative (patient notes he has filled 4 urinals at present). Notes he was diagnosed with CHF at [facility] with normal LHC.  Has no insurance which has caused problems for his care.  From [place]; sees PA at [facility] and does not have cardiologist.  Congestion and breathing have improved.  Inpatient Medications    Scheduled Meds: . aspirin EC  81 mg Oral Daily  . atorvastatin  20 mg Oral Daily  . carvedilol  25 mg Oral BID WC  . enoxaparin (LOVENOX) injection  40 mg Subcutaneous Q24H  . furosemide  40 mg Intravenous BID  . [START ON 08/10/2020] influenza vac split quadrivalent PF  0.5 mL Intramuscular Tomorrow-1000  . lisinopril  5 mg Oral Daily  . nicotine  21 mg Transdermal Daily  . sodium chloride flush  3 mL Intravenous Q12H   Continuous Infusions: . sodium chloride     PRN Meds: sodium chloride, acetaminophen, ondansetron (ZOFRAN) IV, sodium chloride flush   Vital Signs    Vitals:   08/09/20 0230 08/09/20 0325 08/09/20 0806 08/09/20 0829  BP: (!) 135/100 (!) 143/91 (!) 157/96 (!) 146/89  Pulse: (!) 101 89 99 (!) 102  Resp: 14 18 18 18   Temp:  97.9 F (36.6 C) 97.9 F (36.6 C) 98.2 F (36.8 C)  TempSrc:  Oral Oral Oral  SpO2: 90% 97% 96% 94%  Weight:  88.6 kg    Height:  5\' 8"  (1.727 m)      Intake/Output Summary (Last 24 hours) at 08/09/2020 0913 Last data filed at 08/09/2020 0200 Gross per 24 hour  Intake --  Output 450 ml  Net -450 ml   Filed Weights   08/09/20 0325  Weight: 88.6 kg    Telemetry    Sinus rhythm to sinus tachycardia ~ 110 bpm - Personally Reviewed  ECG    Sinus Tachycardia rate 105 RAD poor R r wave  progression 08/08/20 - Personally Reviewed  Physical Exam   GEN: Mild distress with persistent cough Cardiac: RRR, no murmurs, rubs, or gallops.  Respiratory: Good air movement with mild expiratory wheeze GI: Soft, nontender, non-distended, no dullness to percussion MS: No edema; No deformity. Neuro:  Nonfocal  Psych: Normal affect   Labs    Chemistry Recent Labs  Lab 08/08/20 1507 08/09/20 0115  NA 135  --   K 3.9  --   CL 103  --   CO2 20*  --   GLUCOSE 127*  --   BUN 11  --   CREATININE 0.80 0.95  CALCIUM 9.6  --   GFRNONAA >60 >60  ANIONGAP 12  --      Hematology Recent Labs  Lab 08/08/20 1507 08/09/20 0115 08/09/20 0359  WBC 9.3 6.1 3.8*  RBC 4.88 5.14 4.98  HGB 15.0 15.9 15.7  HCT 45.4 47.7 46.7  MCV 93.0 92.8 93.8  MCH 30.7 30.9 31.5  MCHC 33.0 33.3 33.6  RDW 13.5 13.4 13.4  PLT 152 167 137*   Cardiac Enzymes novel Troponin 41->44  BNP Recent Labs  Lab 08/08/20 1508  BNP 428.6*     DDimer  Recent Labs  Lab 08/08/20 1701  DDIMER 0.86*  Radiology    DG Chest 2 View  Result Date: 08/08/2020 CLINICAL DATA:  Chest pain. Shortness of breath. History of congestive heart failure. EXAM: CHEST - 2 VIEW COMPARISON:  August 08, 2020 FINDINGS: The heart, hila, and mediastinum are normal. No pulmonary nodules or masses. No focal infiltrates or overt edema. IMPRESSION: No active cardiopulmonary disease. Electronically Signed   By: Gerome Sam III M.D   On: 08/08/2020 15:05   CT Angio Chest PE W/Cm &/Or Wo Cm  Result Date: 08/08/2020 CLINICAL DATA:  Elevated D-dimer. EXAM: CT ANGIOGRAPHY CHEST WITH CONTRAST TECHNIQUE: Multidetector CT imaging of the chest was performed using the standard protocol during bolus administration of intravenous contrast. Multiplanar CT image reconstructions and MIPs were obtained to evaluate the vascular anatomy. CONTRAST:  OMNIPAQUE IOHEXOL 350 MG/ML SOLN COMPARISON:  October 12, 2019 FINDINGS: Cardiovascular:  There is moderate severity calcification of the aortic arch and descending thoracic aorta. Satisfactory opacification of the pulmonary arteries to the segmental level. No evidence of pulmonary embolism. Normal heart size. No pericardial effusion. Mediastinum/Nodes: Stable 1.8 cm x 1.7 cm and 2.5 cm x 1.1 cm pretracheal lymph nodes are seen. Additional stable 1.5 cm x 1.0 cm and 2.4 cm x 1.4 cm AP window lymph nodes are also noted. Mild right hilar lymphadenopathy is present. Thyroid gland, trachea, and esophagus demonstrate no significant findings. Lungs/Pleura: Lungs are clear. No pleural effusion or pneumothorax. Upper Abdomen: No acute abnormality. Musculoskeletal: Multilevel degenerative changes seen throughout the thoracic spine. Review of the MIP images confirms the above findings. IMPRESSION: 1. No evidence of pulmonary embolus. 2. Stable mediastinal and right hilar lymphadenopathy. 3. Aortic atherosclerosis. Aortic Atherosclerosis (ICD10-I70.0). Electronically Signed   By: Aram Candela M.D.   On: 08/08/2020 20:43  Borderline Dilation of the ascending aorta:  38.5 mm  Cardiac Studies   Echo- Pending  Patient Profile     59 y.o. 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 - Daily BMP, Mg. - Would increase lisinopril to 10 mg daily (5 mg presently and 10 mg daily starting 08/10/20) - In addition, low threshold to start hydralazine 10 mg BID if  persistent HTN occurs  - Pending TTE.  - Consider outpatient SGLT2i when insurance is available - encouraged smoking cessation - COPD mgmt as per primary - peri-discharge will need Cardiology Follow up [place]  Aortic Atherosclerosis and HLD - reasonable for primary prevention ASA - would continue statin - no evidence of ACS event during this admission  Discussed with patient and primary MD  For questions or updates, please contact [facility] Please consult [email or web address] for contact info under Cardiology/STEMI.      Signed, Christell Constant, MD  08/09/2020, 9:13 AM    Echo:  EF 25%, non-contrasted but without WMA. Discussed results with patient.  Offered LCP; patient is confident he has the old records from his LCP in the past and is having his daughter  bring them up. - will transition to 40 mg PO BID starting tonight - will hope for possible DC 08/10/20 - will get old records - gave education on [place] [facility]  Patient had no further questions.  Christell Constant, MD

## 2020-08-09 NOTE — ED Notes (Signed)
Patient provided with ginger ale and saltine crackers per his request, tolerating well.

## 2020-08-09 NOTE — Plan of Care (Signed)

## 2020-08-09 NOTE — Progress Notes (Signed)
Echocardiogram 2D Echocardiogram has been performed.  Warren Lacy Teeghan Hammer 08/09/2020, 12:16 PM

## 2020-08-09 NOTE — Progress Notes (Signed)
PROGRESS NOTE    Cody Carney   GDJ:[id number]  DOB: [birth year]  DOA: 08/08/2020 PCP: Patient, [name]   Brief Narrative:  Cody Carney is a 59 y/o male CHF, COPD, hypertension and tobacco abuse presenting with 4 days of substernal chest pain and difficulty breathing. Chest pain is described as heaviness, elephant sitting on his chest and it has been essentially persistent for 4 days. He had a new cough this week and has been coughing up colored sputum.   In ED > pulse ox in 90, temp 99.3, HR in low 100s, BP 171/103, 152/102  CTA> no PE, stable mediastinal and right hilar lymphadenopathy   Subjective: Breathing a little better but not back to baseline. Continues to have a cough. Chest pain has resolved.     Assessment & Plan:   Principal Problem:   Acute on chronic unspecified CHF (congestive heart failure) - BMP 428.6 - cont IV Lasix per cardiology, Lisinopril doubled from 5-10, cont Coreg at 25 mig BID - await ECHO - he has improved but not at baseline  Active Problems: Chest pain - related to dyspnea?  - Troponin in 40s    COPD (chronic obstructive pulmonary disease) - nicotine abuse Acute bronchitis?  - lymphadenopathy noted on CT- increased cough and colored sputum- start Doxycyline for 5 days - he has inhalers but does not recall the name - currently no wheezing- cont Nebs PRN - no need for steroids - encouraged to stop smoking- he is adamant that he will quit- he has reduced his smoking from 2ppd to less than 1 ppd    Essential hypertension, uncontrolled - as mentioned, Lisinopril doubled- follow     Chronic back pain - cont narcotics  No insurance - will need to order affordable meds   Time spent in minutes: 35 DVT prophylaxis: enoxaparin (LOVENOX) injection 40 mg Start: 08/09/20 0600  Code Status: Full code Family Communication:  Disposition Plan:  Status is: Inpatient  Remains inpatient appropriate because:treating acute dyspnea on exertion with  IV Lasix   Dispo: The patient is from: Home              Anticipated d/c is to: Home              Anticipated d/c date is: 1 day              Patient currently is not medically stable to d/c.      Consultants:   cardiology Procedures:   none Antimicrobials:  Anti-infectives (From admission, onward)   Start     Dose/Rate Route Frequency Ordered Stop   08/09/20 1145  doxycycline (VIBRA-TABS) tablet 100 mg        100 mg Oral Every 12 hours 08/09/20 1045         Objective: Vitals:   08/09/20 0325 08/09/20 0806 08/09/20 0829 08/09/20 1114  BP: (!) 143/91 (!) 157/96 (!) 146/89 105/72  Pulse: 89 99 (!) 102 (!) 103  Resp: 18 18 18 20   Temp: 97.9 F (36.6 C) 97.9 F (36.6 C) 98.2 F (36.8 C) 98.4 F (36.9 C)  TempSrc: Oral Oral Oral Oral  SpO2: 97% 96% 94% 94%  Weight: 88.6 kg     Height: 5\' 8"  (1.727 m)       Intake/Output Summary (Last 24 hours) at 08/09/2020 1451 Last data filed at 08/09/2020 1259 Gross per 24 hour  Intake 480 ml  Output 1050 ml  Net -570 ml   Filed Weights   08/09/20  0325  Weight: 88.6 kg    Examination: General exam: Appears comfortable  HEENT: PERRLA, oral mucosa moist, no sclera icterus or thrush Respiratory system: Crackles at bases. Respiratory effort normal. Cardiovascular system: S1 & S2 heard, RRR.  Tachycardia, HR > 100 Gastrointestinal system: Abdomen soft, non-tender, nondistended. Normal bowel sounds. Central nervous system: Alert and oriented. No focal neurological deficits. Extremities: No cyanosis, clubbing or edema Skin: No rashes or ulcers Psychiatry:  Mood & affect appropriate.     Data Reviewed: I have personally reviewed following labs and imaging studies  CBC: Recent Labs  Lab 08/08/20 1507 08/09/20 0115 08/09/20 0359  WBC 9.3 6.1 3.8*  NEUTROABS  --   --  3.4  HGB 15.0 15.9 15.7  HCT 45.4 47.7 46.7  MCV 93.0 92.8 93.8  PLT 152 167 137*   Basic Metabolic Panel: Recent Labs  Lab 08/08/20 1507  08/09/20 0115  NA 135  --   K 3.9  --   CL 103  --   CO2 20*  --   GLUCOSE 127*  --   BUN 11  --   CREATININE 0.80 0.95  CALCIUM 9.6  --    GFR: Estimated Creatinine Clearance: 93.9 mL/min (by C-G formula based on SCr of 0.95 mg/dL). Liver Function Tests: No results for input(s): AST, ALT, ALKPHOS, BILITOT, PROT, ALBUMIN in the last 168 hours. No results for input(s): LIPASE, AMYLASE in the last 168 hours. No results for input(s): AMMONIA in the last 168 hours. Coagulation Profile: No results for input(s): INR, PROTIME in the last 168 hours. Cardiac Enzymes: No results for input(s): CKTOTAL, CKMB, CKMBINDEX, TROPONINI in the last 168 hours. BNP (last 3 results) No results for input(s): PROBNP in the last 8760 hours. HbA1C: No results for input(s): HGBA1C in the last 72 hours. CBG: No results for input(s): GLUCAP in the last 168 hours. Lipid Profile: No results for input(s): CHOL, HDL, LDLCALC, TRIG, CHOLHDL, LDLDIRECT in the last 72 hours. Thyroid Function Tests: No results for input(s): TSH, T4TOTAL, FREET4, T3FREE, THYROIDAB in the last 72 hours. Anemia Panel: No results for input(s): VITAMINB12, FOLATE, FERRITIN, TIBC, IRON, RETICCTPCT in the last 72 hours. Urine analysis: No results found for: COLORURINE, APPEARANCEUR, LABSPEC, PHURINE, GLUCOSEU, HGBUR, BILIRUBINUR, KETONESUR, PROTEINUR, UROBILINOGEN, NITRITE, LEUKOCYTESUR Sepsis Labs: @LABRCNTIP (procalcitonin:4,lacticidven:4) ) Recent Results (from the past 240 hour(s))  Respiratory Panel by RT PCR (Flu A&B, Covid) - Nasopharyngeal Swab     Status: None   Collection Time: 08/08/20  4:23 PM   Specimen: Nasopharyngeal Swab  Result Value Ref Range Status   SARS Coronavirus 2 by RT PCR NEGATIVE NEGATIVE Final    Comment: (NOTE) SARS-CoV-2 target nucleic acids are NOT DETECTED.  The SARS-CoV-2 RNA is generally detectable in upper respiratoy specimens during the acute phase of infection. The lowest concentration of  SARS-CoV-2 viral copies this assay can detect is 131 copies/mL. A negative result does not preclude SARS-Cov-2 infection and should not be used as the sole basis for treatment or other patient management decisions. A negative result may occur with  improper specimen collection/handling, submission of specimen other than nasopharyngeal swab, presence of viral mutation(s) within the areas targeted by this assay, and inadequate number of viral copies (<131 copies/mL). A negative result must be combined with clinical observations, patient history, and epidemiological information. The expected result is Negative.  Fact Sheet for Patients:  08/10/20  Fact Sheet for Healthcare Providers:  https://www.moore.com/  This test is no t yet approved or cleared by the https://www.young.biz/  States FDA and  has been authorized for detection and/or diagnosis of SARS-CoV-2 by FDA under an Emergency Use Authorization (EUA). This EUA will remain  in effect (meaning this test can be used) for the duration of the COVID-19 declaration under Section 564(b)(1) of the Act, 21 U.S.C. section 360bbb-3(b)(1), unless the authorization is terminated or revoked sooner.     Influenza A by PCR NEGATIVE NEGATIVE Final   Influenza B by PCR NEGATIVE NEGATIVE Final    Comment: (NOTE) The Xpert Xpress SARS-CoV-2/FLU/RSV assay is intended as an aid in  the diagnosis of influenza from Nasopharyngeal swab specimens and  should not be used as a sole basis for treatment. Nasal washings and  aspirates are unacceptable for Xpert Xpress SARS-CoV-2/FLU/RSV  testing.  Fact Sheet for Patients: https://www.moore.com/  Fact Sheet for Healthcare Providers: https://www.young.biz/  This test is not yet approved or cleared by the [place] FDA and  has been authorized for detection and/or diagnosis of SARS-CoV-2 by  FDA under an Emergency Use  Authorization (EUA). This EUA will remain  in effect (meaning this test can be used) for the duration of the  Covid-19 declaration under Section 564(b)(1) of the Act, 21  U.S.C. section 360bbb-3(b)(1), unless the authorization is  terminated or revoked. Performed at [facility], [address] [place], [place] [address]          Radiology Studies: DG Chest 2 View  Result Date: 08/08/2020 CLINICAL DATA:  Chest pain. Shortness of breath. History of congestive heart failure. EXAM: CHEST - 2 VIEW COMPARISON:  August 08, 2020 FINDINGS: The heart, hila, and mediastinum are normal. No pulmonary nodules or masses. No focal infiltrates or overt edema. IMPRESSION: No active cardiopulmonary disease. Electronically Signed   By: Gerome Sam III M.D   On: 08/08/2020 15:05   CT Angio Chest PE W/Cm &/Or Wo Cm  Result Date: 08/08/2020 CLINICAL DATA:  Elevated D-dimer. EXAM: CT ANGIOGRAPHY CHEST WITH CONTRAST TECHNIQUE: Multidetector CT imaging of the chest was performed using the standard protocol during bolus administration of intravenous contrast. Multiplanar CT image reconstructions and MIPs were obtained to evaluate the vascular anatomy. CONTRAST:  OMNIPAQUE IOHEXOL 350 MG/ML SOLN COMPARISON:  October 12, 2019 FINDINGS: Cardiovascular: There is moderate severity calcification of the aortic arch and descending thoracic aorta. Satisfactory opacification of the pulmonary arteries to the segmental level. No evidence of pulmonary embolism. Normal heart size. No pericardial effusion. Mediastinum/Nodes: Stable 1.8 cm x 1.7 cm and 2.5 cm x 1.1 cm pretracheal lymph nodes are seen. Additional stable 1.5 cm x 1.0 cm and 2.4 cm x 1.4 cm AP window lymph nodes are also noted. Mild right hilar lymphadenopathy is present. Thyroid gland, trachea, and esophagus demonstrate no significant findings. Lungs/Pleura: Lungs are clear. No pleural effusion or pneumothorax. Upper Abdomen: No acute abnormality.  Musculoskeletal: Multilevel degenerative changes seen throughout the thoracic spine. Review of the MIP images confirms the above findings. IMPRESSION: 1. No evidence of pulmonary embolus. 2. Stable mediastinal and right hilar lymphadenopathy. 3. Aortic atherosclerosis. Aortic Atherosclerosis (ICD10-I70.0). Electronically Signed   By: Aram Candela M.D.   On: 08/08/2020 20:43      Scheduled Meds:  aspirin EC  81 mg Oral Daily   atorvastatin  20 mg Oral Daily   carvedilol  25 mg Oral BID WC   doxycycline  100 mg Oral Q12H   enoxaparin (LOVENOX) injection  40 mg Subcutaneous Q24H   furosemide  40 mg Intravenous BID   [START ON 08/10/2020]  influenza vac split quadrivalent PF  0.5 mL Intramuscular Tomorrow-1000   [START ON 08/10/2020] lisinopril  10 mg Oral Daily   nicotine  21 mg Transdermal Daily   sodium chloride flush  3 mL Intravenous Q12H   Continuous Infusions:  sodium chloride       LOS: 1 day      Calvert Cantor, MD [facility] Pager: [email or web address] 08/09/2020, 2:51 PM

## 2020-08-09 NOTE — Progress Notes (Signed)
PROGRESS NOTE    Cody Carney   XKG:[id number]  DOB: 08/16/1961  DOA: 08/08/2020 PCP: Patient, [name]   Brief Narrative:  Cody Carney is a 59 y/o male CHF, COPD, hypertension and tobacco abuse presenting with 4 days of substernal chest pain and difficulty breathing. Chest pain is described as heaviness, elephant sitting on his chest and it has been essentially persistent for 4 days. He had a new cough this week and has been coughing up colored sputum.   In ED > pulse ox in 90, temp 99.3, HR in low 100s, BP 171/103, 152/102  CTA> no PE, stable mediastinal and right hilar lymphadenopathy   Subjective: Breathing a little better but not back to baseline. Continues to have a cough. Chest pain has resolved.     Assessment & Plan:   Principal Problem:   Acute on chronic unspecified CHF (congestive heart failure) - BMP 428.6 - cont IV Lasix per cardiology, Lisinopril doubled from 5-10, cont Coreg at 25 mig BID - await ECHO - he has improved but not at baseline  Active Problems: Chest pain - related to dyspnea?  - Troponin in 40s    COPD (chronic obstructive pulmonary disease) - nicotine abuse Acute bronchitis?  - lymphadenopathy noted on CT- increased cough and colored sputum- start Doxycyline for 5 days - he has inhalers but does not recall the name - currently no wheezing- cont Nebs PRN - no need for steroids - encouraged to stop smoking- he is adamant that he will quit- he has reduced his smoking from 2ppd to less than 1 ppd    Essential hypertension, uncontrolled - as mentioned, Lisinopril doubled- follow     Chronic back pain - cont narcotics  No insurance - will need to order affordable meds   Time spent in minutes: 35 DVT prophylaxis: enoxaparin (LOVENOX) injection 40 mg Start: 08/09/20 0600  Code Status: Full code Family Communication:  Disposition Plan:  Status is: Inpatient  Remains inpatient appropriate because:treating acute dyspnea on exertion with  IV Lasix   Dispo: The patient is from: Home              Anticipated d/c is to: Home              Anticipated d/c date is: 1 day              Patient currently is not medically stable to d/c.      Consultants:   cardiology Procedures:   none Antimicrobials:  Anti-infectives (From admission, onward)   None       Objective: Vitals:   08/09/20 0230 08/09/20 0325 08/09/20 0806 08/09/20 0829  BP: (!) 135/100 (!) 143/91 (!) 157/96 (!) 146/89  Pulse: (!) 101 89 99 (!) 102  Resp: 14 18 18 18   Temp:  97.9 F (36.6 C) 97.9 F (36.6 C) 98.2 F (36.8 C)  TempSrc:  Oral Oral Oral  SpO2: 90% 97% 96% 94%  Weight:  88.6 kg    Height:  5\' 8"  (1.727 m)      Intake/Output Summary (Last 24 hours) at 08/09/2020 1032 Last data filed at 08/09/2020 0958 Gross per 24 hour  Intake 240 ml  Output 1050 ml  Net -810 ml   Filed Weights   08/09/20 0325  Weight: 88.6 kg    Examination: General exam: Appears comfortable  HEENT: PERRLA, oral mucosa moist, no sclera icterus or thrush Respiratory system: Crackles at bases. Respiratory effort normal. Cardiovascular system: S1 & S2 heard,  RRR.  Tachycardia, HR > 100 Gastrointestinal system: Abdomen soft, non-tender, nondistended. Normal bowel sounds. Central nervous system: Alert and oriented. No focal neurological deficits. Extremities: No cyanosis, clubbing or edema Skin: No rashes or ulcers Psychiatry:  Mood & affect appropriate.     Data Reviewed: I have personally reviewed following labs and imaging studies  CBC: Recent Labs  Lab 08/08/20 1507 08/09/20 0115 08/09/20 0359  WBC 9.3 6.1 3.8*  NEUTROABS  --   --  3.4  HGB 15.0 15.9 15.7  HCT 45.4 47.7 46.7  MCV 93.0 92.8 93.8  PLT 152 167 137*   Basic Metabolic Panel: Recent Labs  Lab 08/08/20 1507 08/09/20 0115  NA 135  --   K 3.9  --   CL 103  --   CO2 20*  --   GLUCOSE 127*  --   BUN 11  --   CREATININE 0.80 0.95  CALCIUM 9.6  --    GFR: Estimated  Creatinine Clearance: 93.9 mL/min (by C-G formula based on SCr of 0.95 mg/dL). Liver Function Tests: No results for input(s): AST, ALT, ALKPHOS, BILITOT, PROT, ALBUMIN in the last 168 hours. No results for input(s): LIPASE, AMYLASE in the last 168 hours. No results for input(s): AMMONIA in the last 168 hours. Coagulation Profile: No results for input(s): INR, PROTIME in the last 168 hours. Cardiac Enzymes: No results for input(s): CKTOTAL, CKMB, CKMBINDEX, TROPONINI in the last 168 hours. BNP (last 3 results) No results for input(s): PROBNP in the last 8760 hours. HbA1C: No results for input(s): HGBA1C in the last 72 hours. CBG: No results for input(s): GLUCAP in the last 168 hours. Lipid Profile: No results for input(s): CHOL, HDL, LDLCALC, TRIG, CHOLHDL, LDLDIRECT in the last 72 hours. Thyroid Function Tests: No results for input(s): TSH, T4TOTAL, FREET4, T3FREE, THYROIDAB in the last 72 hours. Anemia Panel: No results for input(s): VITAMINB12, FOLATE, FERRITIN, TIBC, IRON, RETICCTPCT in the last 72 hours. Urine analysis: No results found for: COLORURINE, APPEARANCEUR, LABSPEC, PHURINE, GLUCOSEU, HGBUR, BILIRUBINUR, KETONESUR, PROTEINUR, UROBILINOGEN, NITRITE, LEUKOCYTESUR Sepsis Labs: @LABRCNTIP (procalcitonin:4,lacticidven:4) ) Recent Results (from the past 240 hour(s))  Respiratory Panel by RT PCR (Flu A&B, Covid) - Nasopharyngeal Swab     Status: None   Collection Time: 08/08/20  4:23 PM   Specimen: Nasopharyngeal Swab  Result Value Ref Range Status   SARS Coronavirus 2 by RT PCR NEGATIVE NEGATIVE Final    Comment: (NOTE) SARS-CoV-2 target nucleic acids are NOT DETECTED.  The SARS-CoV-2 RNA is generally detectable in upper respiratoy specimens during the acute phase of infection. The lowest concentration of SARS-CoV-2 viral copies this assay can detect is 131 copies/mL. A negative result does not preclude SARS-Cov-2 infection and should not be used as the sole basis for  treatment or other patient management decisions. A negative result may occur with  improper specimen collection/handling, submission of specimen other than nasopharyngeal swab, presence of viral mutation(s) within the areas targeted by this assay, and inadequate number of viral copies (<131 copies/mL). A negative result must be combined with clinical observations, patient history, and epidemiological information. The expected result is Negative.  Fact Sheet for Patients:  08/10/20  Fact Sheet for Healthcare Providers:  https://www.moore.com/  This test is no t yet approved or cleared by the https://www.young.biz/ FDA and  has been authorized for detection and/or diagnosis of SARS-CoV-2 by FDA under an Emergency Use Authorization (EUA). This EUA will remain  in effect (meaning this test can be used) for the duration of  the COVID-19 declaration under Section 564(b)(1) of the Act, 21 U.S.C. section 360bbb-3(b)(1), unless the authorization is terminated or revoked sooner.     Influenza A by PCR NEGATIVE NEGATIVE Final   Influenza B by PCR NEGATIVE NEGATIVE Final    Comment: (NOTE) The Xpert Xpress SARS-CoV-2/FLU/RSV assay is intended as an aid in  the diagnosis of influenza from Nasopharyngeal swab specimens and  should not be used as a sole basis for treatment. Nasal washings and  aspirates are unacceptable for Xpert Xpress SARS-CoV-2/FLU/RSV  testing.  Fact Sheet for Patients: https://www.moore.com/  Fact Sheet for Healthcare Providers: https://www.young.biz/  This test is not yet approved or cleared by the [place] FDA and  has been authorized for detection and/or diagnosis of SARS-CoV-2 by  FDA under an Emergency Use Authorization (EUA). This EUA will remain  in effect (meaning this test can be used) for the duration of the  Covid-19 declaration under Section 564(b)(1) of the Act, 21   U.S.C. section 360bbb-3(b)(1), unless the authorization is  terminated or revoked. Performed at [facility], [address] [place], [place] [address]          Radiology Studies: DG Chest 2 View  Result Date: 08/08/2020 CLINICAL DATA:  Chest pain. Shortness of breath. History of congestive heart failure. EXAM: CHEST - 2 VIEW COMPARISON:  August 08, 2020 FINDINGS: The heart, hila, and mediastinum are normal. No pulmonary nodules or masses. No focal infiltrates or overt edema. IMPRESSION: No active cardiopulmonary disease. Electronically Signed   By: Gerome Sam III M.D   On: 08/08/2020 15:05   CT Angio Chest PE W/Cm &/Or Wo Cm  Result Date: 08/08/2020 CLINICAL DATA:  Elevated D-dimer. EXAM: CT ANGIOGRAPHY CHEST WITH CONTRAST TECHNIQUE: Multidetector CT imaging of the chest was performed using the standard protocol during bolus administration of intravenous contrast. Multiplanar CT image reconstructions and MIPs were obtained to evaluate the vascular anatomy. CONTRAST:  OMNIPAQUE IOHEXOL 350 MG/ML SOLN COMPARISON:  October 12, 2019 FINDINGS: Cardiovascular: There is moderate severity calcification of the aortic arch and descending thoracic aorta. Satisfactory opacification of the pulmonary arteries to the segmental level. No evidence of pulmonary embolism. Normal heart size. No pericardial effusion. Mediastinum/Nodes: Stable 1.8 cm x 1.7 cm and 2.5 cm x 1.1 cm pretracheal lymph nodes are seen. Additional stable 1.5 cm x 1.0 cm and 2.4 cm x 1.4 cm AP window lymph nodes are also noted. Mild right hilar lymphadenopathy is present. Thyroid gland, trachea, and esophagus demonstrate no significant findings. Lungs/Pleura: Lungs are clear. No pleural effusion or pneumothorax. Upper Abdomen: No acute abnormality. Musculoskeletal: Multilevel degenerative changes seen throughout the thoracic spine. Review of the MIP images confirms the above findings. IMPRESSION: 1. No evidence of  pulmonary embolus. 2. Stable mediastinal and right hilar lymphadenopathy. 3. Aortic atherosclerosis. Aortic Atherosclerosis (ICD10-I70.0). Electronically Signed   By: Aram Candela M.D.   On: 08/08/2020 20:43      Scheduled Meds: . aspirin EC  81 mg Oral Daily  . atorvastatin  20 mg Oral Daily  . carvedilol  25 mg Oral BID WC  . enoxaparin (LOVENOX) injection  40 mg Subcutaneous Q24H  . furosemide  40 mg Intravenous BID  . [START ON 08/10/2020] influenza vac split quadrivalent PF  0.5 mL Intramuscular Tomorrow-1000  . [START ON 08/10/2020] lisinopril  10 mg Oral Daily  . nicotine  21 mg Transdermal Daily  . sodium chloride flush  3 mL Intravenous Q12H   Continuous Infusions: . sodium chloride  LOS: 1 day      Calvert Cantor, MD [facility] Pager: [email or web address] 08/09/2020, 10:32 AM

## 2020-08-09 NOTE — ED Notes (Signed)
Upon entering room for blood draw, tourniquet found to patient's left upper arm. Same released with indentation noted to area, cap refill <2 seconds, movement and sensation remain intact to patient's distal LUE.

## 2020-08-10 ENCOUNTER — Other Ambulatory Visit (HOSPITAL_COMMUNITY): Payer: Self-pay | Admitting: Internal Medicine

## 2020-08-10 ENCOUNTER — Encounter: Payer: Self-pay | Admitting: *Deleted

## 2020-08-10 DIAGNOSIS — J439 Emphysema, unspecified: Secondary | ICD-10-CM

## 2020-08-10 DIAGNOSIS — Z006 Encounter for examination for normal comparison and control in clinical research program: Secondary | ICD-10-CM

## 2020-08-10 LAB — BASIC METABOLIC PANEL
Anion gap: 11 (ref 5–15)
BUN: 34 mg/dL — ABNORMAL HIGH (ref 6–20)
CO2: 23 mmol/L (ref 22–32)
Calcium: 9.4 mg/dL (ref 8.9–10.3)
Chloride: 103 mmol/L (ref 98–111)
Creatinine, Ser: 1.12 mg/dL (ref 0.61–1.24)
GFR, Estimated: >60 mL/min (ref >60)
Glucose, Bld: 154 mg/dL — ABNORMAL HIGH (ref 70–99)
Potassium: 3.9 mmol/L (ref 3.5–5.1)
Sodium: 137 mmol/L (ref 135–145)

## 2020-08-10 LAB — BRAIN NATRIURETIC PEPTIDE: B Natriuretic Peptide: 113.8 pg/mL — ABNORMAL HIGH (ref 0.0–100.0)

## 2020-08-10 SURGERY — LEFT HEART CATH AND CORONARY ANGIOGRAPHY
Anesthesia: LOCAL

## 2020-08-10 MED ORDER — DOXYCYCLINE HYCLATE 100 MG PO TABS
100.0000 mg | ORAL_TABLET | Freq: Two times a day (BID) | ORAL | 0 refills | Status: DC
Start: 2020-08-10 — End: 2020-11-26

## 2020-08-10 MED ORDER — FUROSEMIDE 40 MG PO TABS
40.0000 mg | ORAL_TABLET | Freq: Two times a day (BID) | ORAL | 0 refills | Status: DC
Start: 2020-08-10 — End: 2020-10-12

## 2020-08-10 MED ORDER — STUDY - DAPA TIMI 68 - DAPAGLIFLOZIN (FARXIGA) 10 MG OR PLACEBO TABLET (PI-DALTON MCLEAN)
1.0000 | ORAL_TABLET | Freq: Every day | ORAL | Status: DC
  Administered 2020-08-10: 1 via ORAL
  Filled 2020-08-10: qty 1

## 2020-08-10 MED ORDER — FUROSEMIDE 40 MG PO TABS
40.0000 mg | ORAL_TABLET | Freq: Two times a day (BID) | ORAL | Status: DC
  Administered 2020-08-10: 40 mg via ORAL
  Filled 2020-08-10: qty 1

## 2020-08-10 MED ORDER — LOSARTAN POTASSIUM 50 MG PO TABS
50.0000 mg | ORAL_TABLET | Freq: Every day | ORAL | 0 refills | Status: DC
Start: 2020-08-11 — End: 2020-10-12

## 2020-08-10 MED ORDER — POTASSIUM CHLORIDE ER 10 MEQ PO TBCR: 10.0000 meq | EXTENDED_RELEASE_TABLET | Freq: Two times a day (BID) | ORAL | 0 refills | Status: DC

## 2020-08-10 MED ORDER — STUDY - DAPA TIMI 68 - DAPAGLIFLOZIN (FARXIGA) 10 MG OR PLACEBO TABLET (PI-DALTON MCLEAN)
1.0000 | ORAL_TABLET | Freq: Every day | ORAL | Status: DC
Start: 2020-08-10 — End: 2020-11-18

## 2020-08-10 MED ORDER — LOSARTAN POTASSIUM 50 MG PO TABS: 50.0000 mg | ORAL_TABLET | Freq: Every day | ORAL | Status: DC

## 2020-08-10 MED ORDER — NICOTINE 21 MG/24HR TD PT24
21.0000 mg | MEDICATED_PATCH | Freq: Every day | TRANSDERMAL | 0 refills | Status: DC
Start: 2020-08-11 — End: 2020-11-26

## 2020-08-10 MED FILL — POTASSIUM CHL ER M10 TABLET: 10 | 30 days supply | Qty: 60 | Fill #0

## 2020-08-10 MED FILL — FUROSEMIDE 40 MG TABLET: 40 | 30 days supply | Qty: 60 | Fill #0

## 2020-08-10 MED FILL — DOXYCYCLINE HYCLATE 100 MG: 100 | 3 days supply | Qty: 7 | Fill #0

## 2020-08-10 MED FILL — LOSARTAN POTASSIUM 50 MG TA: 50 | 30 days supply | Qty: 30 | Fill #0

## 2020-08-10 NOTE — TOC Transition Note (Signed)
Transition of Care [facility]) - CM/SW Discharge Note   Patient Details  Name: Cody Carney MRN: [id number] Date of Birth: [birth year]  [facility] CM/SW Contact:  Leone Haven, RN Phone Number: 08/10/2020, 4:03 PM   Clinical Narrative:    Patient for dc today, NCM assisted with Match Letter and TOC will fill the meds for patient.  Patient states he has money to pay 3.00 each for the meds   Final next level of care: Home/Self Care Barriers to Discharge: No Barriers Identified   Patient Goals and CMS Choice Patient states their goals for this hospitalization and ongoing recovery are:: to get better   Choice offered to / list presented to : NA  Discharge Placement                       Discharge Plan and Services                  DME Agency: NA       HH Arranged: NA          Social Determinants of Health (SDOH) Interventions     Readmission Risk Interventions No flowsheet data found.

## 2020-08-10 NOTE — Progress Notes (Signed)
Heart Failure Patient Advocate Encounter  After discussing with cardiology, plan to start Entresto at first clinic visit. Patient assistance application for Sherryll Burger has been completed. Will send in to [employer] once an active prescription is written. The patient qualifies for a one-time free 30 day fill of Entresto. This can be used while the application is pending.  Will continue to follow.  [employer] Phone:[phone] Fax: [phone]   Sharen Hones, PharmD, BCPS Heart Failure Stewardship Pharmacist Phone [phone]

## 2020-08-10 NOTE — Research (Signed)
Subject Name: Cody Carney  Subject met inclusion and exclusion criteria.  The informed consent form, study requirements and expectations were reviewed with the subject and questions and concerns were addressed prior to the signing of the consent form.  The subject verbalized understanding of the trial requirements.  The subject agreed to participate in the DAPA trial and signed the informed consent at 0953 on 08/10/20  The informed consent was obtained prior to performance of any protocol-specific procedures for the subject.  A copy of the signed informed consent was given to the subject and a copy was placed in the subject's medical record.   Timoteo Gaul     Randomization Visit Worksheet    PATIENT ID: [id number]  DATE OF VISIT:  02/Nov/2021   1.  Confirmed all Inclusion Criteria have been met   '[x]'  Yes      '[]'  No  2. Confirmed none of the Exclusion Criteria are present '[x]'  Yes       '[]'  No   3. Vital Signs  Pulse 66 bpm   Height 68 '[]'   cm  '[x]'  in (xxx.xx)  B/P 107/67 mmHg  Body Weight 88.4 '[x]'   kg  '[]'  lbs  (xxx.xx)   4. HF Signs and Symptoms Assessed by: Preston Fleeting, RN  5.    Concomitant medications  Enter current (i.e., at randomization) heart failure and diabetes medications in the eCRF Request details of vasoactive medications given during index hospitalization.    6.     Local Labs Performed Creatinine   1.12 mg/dl Date: 08/10/2020 Potassium   3.9   mmol/L Date: 08/10/2020  7.      Was KCCQ questionnaire completed?  '[x]'  Yes  '[]'  No   8.       Was patient successfully randomized?  '[x]'  Yes  '[]'  No     9.      Study Drug Dispensed  '[x]'  Dispense Study Drug in [employer] [place]  Bottle number 1:   09470   Bottle number 2:   96283        '[x]'  Give first dose of study drug day of randomization   10.    Other Items  '[x]'  Schedule Week 1 Clinic Visit ___________________________        '[]'  Schedule Month 1 Clinic Visit __________________________  '[x]'  Provide patient a copy  of the signed ICF, patient ID card, patient medication instructions and          any other patient material  '[x]'  Remind patient of importance of complying with study medication        '[x]'  Remind patient to bring both study medication bottles (even if empty) to next clinic visit        '[x]'  Remind patient of importance of complying with study medication (1 pill per day)        '[x]'  Review Patient Contact Information Sheet, update if necessary             '[x]'  Complete Discharge Pages in eCRF after patient is discharged from Index Hospitalization   DAPA ACT HF-TIMI 68 Randomization Visit Worksheet Version 2.0 Date: 13 May 2020

## 2020-08-10 NOTE — Progress Notes (Signed)
D/C instructions given and reviewed. No questions asked but encouraged to call with any concerns. Tele and IV removed, tolerated well. 

## 2020-08-10 NOTE — Discharge Instructions (Signed)
Low-Sodium Eating Plan Sodium, which is an element that makes up salt, helps you maintain a healthy balance of fluids in your body. Too much sodium can increase your blood pressure and cause fluid and waste to be held in your body. Your health care provider or dietitian may recommend following this plan if you have high blood pressure (hypertension), kidney disease, liver disease, or heart failure. Eating less sodium can help lower your blood pressure, reduce swelling, and protect your heart, liver, and kidneys. What are tips for following this plan? General guidelines  Most people on this plan should limit their sodium intake to 1,500-2,000 mg (milligrams) of sodium each day. Reading food labels   The Nutrition Facts label lists the amount of sodium in one serving of the food. If you eat more than one serving, you must multiply the listed amount of sodium by the number of servings.  Choose foods with less than 140 mg of sodium per serving.  Avoid foods with 300 mg of sodium or more per serving. Shopping  Look for lower-sodium products, often labeled as "low-sodium" or "no salt added."  Always check the sodium content even if foods are labeled as "unsalted" or "no salt added".  Buy fresh foods. ? Avoid canned foods and premade or frozen meals. ? Avoid canned, cured, or processed meats  Buy breads that have less than 80 mg of sodium per slice. Cooking  Eat more home-cooked food and less restaurant, buffet, and fast food.  Avoid adding salt when cooking. Use salt-free seasonings or herbs instead of table salt or sea salt. Check with your health care provider or pharmacist before using salt substitutes.  Cook with plant-based oils, such as canola, sunflower, or olive oil. Meal planning  When eating at a restaurant, ask that your food be prepared with less salt or no salt, if possible.  Avoid foods that contain MSG (monosodium glutamate). MSG is sometimes added to Chinese food,  bouillon, and some canned foods. What foods are recommended? The items listed may not be a complete list. Talk with your dietitian about what dietary choices are best for you. Grains Low-sodium cereals, including oats, puffed wheat and rice, and shredded wheat. Low-sodium crackers. Unsalted rice. Unsalted pasta. Low-sodium bread. Whole-grain breads and whole-grain pasta. Vegetables Fresh or frozen vegetables. "No salt added" canned vegetables. "No salt added" tomato sauce and paste. Low-sodium or reduced-sodium tomato and vegetable juice. Fruits Fresh, frozen, or canned fruit. Fruit juice. Meats and other protein foods Fresh or frozen (no salt added) meat, poultry, seafood, and fish. Low-sodium canned tuna and salmon. Unsalted nuts. Dried peas, beans, and lentils without added salt. Unsalted canned beans. Eggs. Unsalted nut butters. Dairy Milk. Soy milk. Cheese that is naturally low in sodium, such as ricotta cheese, fresh mozzarella, or Swiss cheese Low-sodium or reduced-sodium cheese. Cream cheese. Yogurt. Fats and oils Unsalted butter. Unsalted margarine with no trans fat. Vegetable oils such as canola or olive oils. Seasonings and other foods Fresh and dried herbs and spices. Salt-free seasonings. Low-sodium mustard and ketchup. Sodium-free salad dressing. Sodium-free light mayonnaise. Fresh or refrigerated horseradish. Lemon juice. Vinegar. Homemade, reduced-sodium, or low-sodium soups. Unsalted popcorn and pretzels. Low-salt or salt-free chips. What foods are not recommended? The items listed may not be a complete list. Talk with your dietitian about what dietary choices are best for you. Grains Instant hot cereals. Bread stuffing, pancake, and biscuit mixes. Croutons. Seasoned rice or pasta mixes. Noodle soup cups. Boxed or frozen macaroni and cheese. Regular salted   crackers. Self-rising flour. Vegetables Sauerkraut, pickled vegetables, and relishes. Olives. [place] fries. Onion rings.  Regular canned vegetables (not low-sodium or reduced-sodium). Regular canned tomato sauce and paste (not low-sodium or reduced-sodium). Regular tomato and vegetable juice (not low-sodium or reduced-sodium). Frozen vegetables in sauces. Meats and other protein foods Meat or fish that is salted, canned, smoked, spiced, or pickled. Bacon, ham, sausage, hotdogs, corned beef, chipped beef, packaged lunch meats, salt pork, jerky, pickled herring, anchovies, regular canned tuna, sardines, salted nuts. Dairy Processed cheese and cheese spreads. Cheese curds. Blue cheese. Feta cheese. String cheese. Regular cottage cheese. Buttermilk. Canned milk. Fats and oils Salted butter. Regular margarine. Ghee. Bacon fat. Seasonings and other foods Onion salt, garlic salt, seasoned salt, table salt, and sea salt. Canned and packaged gravies. Worcestershire sauce. Tartar sauce. Barbecue sauce. Teriyaki sauce. Soy sauce, including reduced-sodium. Steak sauce. Fish sauce. Oyster sauce. Cocktail sauce. Horseradish that you find on the shelf. Regular ketchup and mustard. Meat flavorings and tenderizers. Bouillon cubes. Hot sauce and Tabasco sauce. Premade or packaged marinades. Premade or packaged taco seasonings. Relishes. Regular salad dressings. Salsa. Potato and tortilla chips. Corn chips and puffs. Salted popcorn and pretzels. Canned or dried soups. Pizza. Frozen entrees and pot pies. Summary  Eating less sodium can help lower your blood pressure, reduce swelling, and protect your heart, liver, and kidneys.  Most people on this plan should limit their sodium intake to 1,500-2,000 mg (milligrams) of sodium each day.  Canned, boxed, and frozen foods are high in sodium. Restaurant foods, fast foods, and pizza are also very high in sodium. You also get sodium by adding salt to food.  Try to cook at home, eat more fresh fruits and vegetables, and eat less fast food, canned, processed, or prepared foods. This information is  not intended to replace advice given to you by your health care provider. Make sure you discuss any questions you have with your health care provider. Document Revised: 09/07/2017 Document Reviewed: 09/18/2016 Elsevier Patient Education  2020 [employer]  _____________________________________________________________________________________________________   Daily Weight Record It is important to weigh yourself daily. To do this:  Make sure you use a reliable scale. Use the same scale each day.  Keep this daily weight chart near your scale.  Weigh yourself each morning at the same time.  Before weighing yourself: ? Take off your shoes. ? Make sure you are wearing the same amount of clothing each day.  Write down your weight in the spaces on the form.  Compare today's weight to yesterday's weight.  Bring this form with you to your follow-up visits with your health care provider.   Call your health care provider if you have concerns about your weight, including rapid weight gain or loss. Date: ________ Weight: ____________________ Date: ________ Weight: ____________________ Date: ________ Weight: ____________________ Date: ________ Weight: ____________________ Date: ________ Weight: ____________________ Date: ________ Weight: ____________________ Date: ________ Weight: ____________________ Date: ________ Weight: ____________________ Date: ________ Weight: ____________________ Date: ________ Weight: ____________________ Date: ________ Weight: ____________________ Date: ________ Weight: ____________________ Date: ________ Weight: ____________________ Date: ________ Weight: ____________________ Date: ________ Weight: ____________________ Date: ________ Weight: ____________________ Date: ________ Weight: ____________________ Date: ________ Weight: ____________________ Date: ________ Weight: ____________________ Date: ________ Weight: ____________________ Date: ________ Weight:  ____________________ Date: ________ Weight: ____________________ Date: ________ Weight: ____________________ Date: ________ Weight: ____________________ Date: ________ Weight: ____________________ Date: ________ Weight: ____________________ Date: ________ Weight: ____________________ Date: ________ Weight: ____________________ Date: ________ Weight: ____________________ Date: ________ Weight: ____________________ Date: ________ Weight: ____________________ Date: ________ Weight: ____________________ Date:  ________ Weight: ____________________ Date: ________ Weight: ____________________ Date: ________ Weight: ____________________ Date: ________ Weight: ____________________ Date: ________ Weight: ____________________ Date: ________ Weight: ____________________ Date: ________ Weight: ____________________ Date: ________ Weight: ____________________ Date: ________ Weight: ____________________ Date: ________ Weight: ____________________ Date: ________ Weight: ____________________ Date: ________ Weight: ____________________ Date: ________ Weight: ____________________ Date: ________ Weight: ____________________ Date: ________ Weight: ____________________ Date: ________ Weight: ____________________ Date: ________ Weight: ____________________ Date: ________ Weight: ____________________ This information is not intended to replace advice given to you by your health care provider. Make sure you discuss any questions you have with your health care provider. Document Revised: 09/24/2017 Document Reviewed: 09/24/2017 Elsevier Patient Education  2020 [employer].

## 2020-08-10 NOTE — Progress Notes (Addendum)
Heart Failure Stewardship Pharmacist Progress Note   PCP: Patient, [name] PCP-Cardiologist: Christell Constant, MD    HPI:  59 yo M with CHF, COPD, HTN, and tobacco abuse. He presented to the ED on 08/08/20 with chest pain and difficulty breathing. An ECHO was done on 08/09/20 and his LVEF is reduced to 25-30%.  Current HF Medications: Furosemide 40 mg BID Carvedilol 25 mg BID Lisinopril 10 mg daily  Prior to admission HF Medications: Furosemide 40 mg daily Carvedilol 25 mg BID Lisinopril 5 mg daily  Pertinent Lab Values: . Serum creatinine 1.12, BUN 34, Potassium 3.9, Sodium 137, BNP 428.6>113.8  Vital Signs: . Weight: 195 lbs (admission weight: 195 lbs) . Blood pressure: 110/60s  . Heart rate: 70-100s  Medication Assistance / Insurance Benefits Check: Does the patient have prescription insurance?  No  Patient assistance applications in process: Entresto  Outpatient Pharmacy:  Prior to admission outpatient pharmacy: [facility] Is the patient willing to use [facility] TOC pharmacy at discharge? Yes   Assessment: 1. Acute on chronic systolic CHF (EF 29-92%), possibly due to uncontrolled HTN. NYHA class II symptoms. - Continue furosemide 40 mg BID - Continue carvedilol 25 mg BID - Lisinopril 10 mg daily - consider transitioning to [facility] with the plan to transition to [facility] (potentially as outpatient) to avoid washout period from lisinopril. Patient is uninsured (anticipated insurance start date of January). I can help apply and enroll in patient assistance once the transition to [facility] is made so he can receive Entresto for free until his insurance is active. - Consider starting spironolactone as outpatient - Patient is interested in enrolling in DAPA study   Plan: 1) Medication changes recommended at this time: - Stop lisinopril and start losartan  2) Patient assistance application(s): - Will begin the process for Entresto application  3) Education - Patient has been  educated on current HF medications and potential additions to HF medication regimen - Patient verbalizes understanding that over the next few months, these medication doses may change and more medications may be added to optimize HF regimen - Patient has been educated on basic disease state pathophysiology and goals of therapy - Time spent 30 min   Sharen Hones, PharmD, BCPS Heart Failure Engineer, building services Phone [phone]

## 2020-08-10 NOTE — TOC Progression Note (Signed)
Transition of Care [facility]) - Progression Note    Patient Details  Name: Cody Carney MRN: [id number] Date of Birth: [birth year]  Transition of Care [facility]) CM/SW Contact  Leone Haven, RN Phone Number: 08/10/2020, 3:54 PM  Clinical Narrative:    Patient for dc today, NCM assisted with Match Letter and TOC will fill the meds for patient.  Patient states he has money to pay 3.00 each for the meds.        Expected Discharge Plan and Services           Expected Discharge Date: 08/10/20                                     Social Determinants of Health (SDOH) Interventions    Readmission Risk Interventions No flowsheet data found.

## 2020-08-10 NOTE — Plan of Care (Signed)

## 2020-08-10 NOTE — Progress Notes (Addendum)
Progress Note  Patient Name: Cody Carney Date of Encounter: 08/10/2020  [facility] Cardiologist: Christell Constant, MD   Subjective   Pt was able to sleep flat last night, no CP, dyspnea, or PND. No lower extremity edema.  Inpatient Medications    Scheduled Meds:  aspirin EC  81 mg Oral Daily   atorvastatin  20 mg Oral Daily   carvedilol  25 mg Oral BID WC   doxycycline  100 mg Oral Q12H   enoxaparin (LOVENOX) injection  40 mg Subcutaneous Q24H   furosemide  40 mg Oral BID   lisinopril  10 mg Oral Daily   nicotine  21 mg Transdermal Daily   sodium chloride flush  3 mL Intravenous Q12H   Continuous Infusions:  sodium chloride     PRN Meds: sodium chloride, acetaminophen, ondansetron (ZOFRAN) IV, sodium chloride flush   Vital Signs    Vitals:   08/09/20 1646 08/09/20 1718 08/09/20 1955 08/10/20 0337  BP: (!) 117/92 (!) 107/92 92/60 107/67  Pulse: 96 98 89 66  Resp: Temp: 98.6 F (37 C)  98.3 F (36.8 C) 97.7 F (36.5 C)  TempSrc: Oral  Oral Oral  SpO2: 96%  98% 97%  Weight:    88.5 kg  Height:        Intake/Output Summary (Last 24 hours) at 08/10/2020 0953 Last data filed at 08/10/2020 0810 Gross per 24 hour  Intake 1477 ml  Output 1300 ml  Net 177 ml   Last 3 Weights 08/10/2020 08/09/2020  Weight (lbs) 195 lb 1.6 oz 195 lb 6.4 oz  Weight (kg) 88.497 kg 88.633 kg      Telemetry    Sinus rhythm HR 80-90s - Personally Reviewed  ECG    No new tracings - Personally Reviewed  Physical Exam   GEN: No acute distress.   Neck: No JVD Cardiac: RRR, no murmurs, rubs, or gallops.  Respiratory: Clear to auscultation bilaterally. GI: Soft, nontender, non-distended  MS: No edema; No deformity. Neuro:  Nonfocal  Psych: Normal affect   Labs    High Sensitivity Troponin:   Recent Labs  Lab 08/08/20 1507 08/08/20 1701  TROPONINIHS 41* 44*      Chemistry Recent Labs  Lab 08/08/20 1507 08/09/20 0115 08/10/20 0802  NA 135  --   137  K 3.9  --  3.9  CL 103  --  103  CO2 20*  --  23  GLUCOSE 127*  --  154*  BUN 11  --  34*  CREATININE 0.80 0.95 1.12  CALCIUM 9.6  --  9.4  GFRNONAA >60 >60 >60  ANIONGAP 12  --  11     Hematology Recent Labs  Lab 08/08/20 1507 08/09/20 0115 08/09/20 0359  WBC 9.3 6.1 3.8*  RBC 4.88 5.14 4.98  HGB 15.0 15.9 15.7  HCT 45.4 47.7 46.7  MCV 93.0 92.8 93.8  MCH 30.7 30.9 31.5  MCHC 33.0 33.3 33.6  RDW 13.5 13.4 13.4  PLT 152 167 137*    BNP Recent Labs  Lab 08/08/20 1508 08/10/20 0325  BNP 428.6* 113.8*     DDimer  Recent Labs  Lab 08/08/20 1701  DDIMER 0.86*     Radiology    DG Chest 2 View  Result Date: 08/08/2020 CLINICAL DATA:  Chest pain. Shortness of breath. History of congestive heart failure. EXAM: CHEST - 2 VIEW COMPARISON:  August 08, 2020 FINDINGS: The heart, hila, and mediastinum are normal.  No pulmonary nodules or masses. No focal infiltrates or overt edema. IMPRESSION: No active cardiopulmonary disease. Electronically Signed   By: Gerome Sam III M.D   On: 08/08/2020 15:05   CT Angio Chest PE W/Cm &/Or Wo Cm  Result Date: 08/08/2020 CLINICAL DATA:  Elevated D-dimer. EXAM: CT ANGIOGRAPHY CHEST WITH CONTRAST TECHNIQUE: Multidetector CT imaging of the chest was performed using the standard protocol during bolus administration of intravenous contrast. Multiplanar CT image reconstructions and MIPs were obtained to evaluate the vascular anatomy. CONTRAST:  OMNIPAQUE IOHEXOL 350 MG/ML SOLN COMPARISON:  October 12, 2019 FINDINGS: Cardiovascular: There is moderate severity calcification of the aortic arch and descending thoracic aorta. Satisfactory opacification of the pulmonary arteries to the segmental level. No evidence of pulmonary embolism. Normal heart size. No pericardial effusion. Mediastinum/Nodes: Stable 1.8 cm x 1.7 cm and 2.5 cm x 1.1 cm pretracheal lymph nodes are seen. Additional stable 1.5 cm x 1.0 cm and 2.4 cm x 1.4 cm AP window  lymph nodes are also noted. Mild right hilar lymphadenopathy is present. Thyroid gland, trachea, and esophagus demonstrate no significant findings. Lungs/Pleura: Lungs are clear. No pleural effusion or pneumothorax. Upper Abdomen: No acute abnormality. Musculoskeletal: Multilevel degenerative changes seen throughout the thoracic spine. Review of the MIP images confirms the above findings. IMPRESSION: 1. No evidence of pulmonary embolus. 2. Stable mediastinal and right hilar lymphadenopathy. 3. Aortic atherosclerosis. Aortic Atherosclerosis (ICD10-I70.0). Electronically Signed   By: Aram Candela M.D.   On: 08/08/2020 20:43   ECHOCARDIOGRAM COMPLETE  Result Date: 08/09/2020    ECHOCARDIOGRAM REPORT   Patient Name:   Cody Carney Date of Exam: 08/09/2020 Medical Rec #:  [id number]  Height:       68.0 in Accession #:    [id number] Weight:       195.4 lb Date of Birth:  August 02, 1961  BSA:          2.024 m Patient Age:    59 years   BP:           105/72 mmHg Patient Gender: M          HR:           92 bpm. Exam Location:  Inpatient Procedure: 2D Echo, Color Doppler and Cardiac Doppler Indications:    I50.21 Acute systolic (congestive) heart failure  History:        Patient has no prior history of Echocardiogram examinations.                 CHF, COPD; Risk Factors:Hypertension.  Sonographer:    Irving Burton Senior RDCS Referring Phys: [id number] MOHAMMAD L GARBA IMPRESSIONS  1. Left ventricular ejection fraction, by estimation, is 25 to 30%. The left ventricle has severely decreased function. The left ventricle demonstrates global hypokinesis. The left ventricular internal cavity size was moderately dilated. There is mild concentric left ventricular hypertrophy. Left ventricular diastolic parameters are consistent with Grade I diastolic dysfunction (impaired relaxation).  2. Right ventricular systolic function is normal. The right ventricular size is normal.  3. Left atrial size was mildly dilated.  4. The mitral valve is normal in  structure. Trivial mitral valve regurgitation.  5. The aortic valve is normal in structure. There is mild calcification of the aortic valve. There is mild thickening of the aortic valve. Aortic valve regurgitation is not visualized.  6. The inferior vena cava is normal in size with greater than 50% respiratory variability, suggesting right atrial pressure of 3 mmHg. Comparison(s): No prior Echocardiogram. FINDINGS  Left Ventricle: Left ventricular ejection fraction, by estimation, is 25 to 30%. The left ventricle has severely decreased function. The left ventricle demonstrates global hypokinesis. The left ventricular internal cavity size was moderately dilated. There is mild concentric left ventricular hypertrophy. Left ventricular diastolic parameters are consistent with Grade I diastolic dysfunction (impaired relaxation). Right Ventricle: The right ventricular size is normal. Right vetricular wall thickness was not well visualized. Right ventricular systolic function is normal. Left Atrium: Left atrial size was mildly dilated. Right Atrium: Right atrial size was normal in size. Pericardium: There is no evidence of pericardial effusion. Mitral Valve: The mitral valve is normal in structure. There is mild thickening of the mitral valve leaflet(s). Trivial mitral valve regurgitation. Tricuspid Valve: The tricuspid valve is grossly normal. Tricuspid valve regurgitation is trivial. Aortic Valve: The aortic valve is normal in structure. There is mild calcification of the aortic valve. There is mild thickening of the aortic valve. Aortic valve regurgitation is not visualized. Pulmonic Valve: The pulmonic valve was normal in structure. Pulmonic valve regurgitation is not visualized. Aorta: The aortic root and ascending aorta are structurally normal, with no evidence of dilitation. Venous: The inferior vena cava is normal in size with greater than 50% respiratory variability, suggesting right atrial pressure of 3 mmHg.  IAS/Shunts: No atrial level shunt detected by color flow Doppler.  LEFT VENTRICLE PLAX 2D LVIDd:         5.53 cm      Diastology LVIDs:         4.62 cm      LV e' medial:    0.04 cm/s LV PW:         1.19 cm      LV E/e' medial:  14.0 LV IVS:        0.97 cm      LV e' lateral:   0.07 cm/s LVOT diam:     2.20 cm      LV E/e' lateral: 9.4 LV SV:         58 LV SV Index:   29 LVOT Area:     3.80 cm  LV Volumes (MOD) LV vol d, MOD A2C: 225.0 ml LV vol d, MOD A4C: 177.0 ml LV vol s, MOD A2C: 149.0 ml LV vol s, MOD A4C: 125.0 ml LV SV MOD A2C:     76.0 ml LV SV MOD A4C:     177.0 ml LV SV MOD BP:      76.0 ml RIGHT VENTRICLE RV S prime:     12.50 cm/s TAPSE (M-mode): 2.3 cm LEFT ATRIUM           Index       RIGHT ATRIUM           Index LA diam:      3.80 cm 1.88 cm/m  RA Area:     19.00 cm LA Vol (A2C): 78.8 ml 38.94 ml/m RA Volume:   55.80 ml  27.57 ml/m LA Vol (A4C): 79.4 ml 39.24 ml/m  AORTIC VALVE LVOT Vmax:   92.40 cm/s LVOT Vmean:  70.100 cm/s LVOT VTI:    0.152 m  AORTA Ao Root diam: 3.40 cm Ao Asc diam:  3.30 cm MITRAL VALVE MV Area (PHT): 3.74 cm    SHUNTS MV Decel Time: 203 msec    Systemic VTI:  0.15 m MV E velocity: 0.61 cm/s   Systemic Diam: 2.20 cm MV A velocity: 86.30 cm/s MV E/A ratio:  0.01 Laurance Flatten MD Electronically signed  by Laurance Flatten MD Signature Date/Time: 08/09/2020/4:12:09 PM    Final     Cardiac Studies   Echo 08/09/20: 1. Left ventricular ejection fraction, by estimation, is 25 to 30%. The  left ventricle has severely decreased function. The left ventricle  demonstrates global hypokinesis. The left ventricular internal cavity size  was moderately dilated. There is mild  concentric left ventricular hypertrophy. Left ventricular diastolic  parameters are consistent with Grade I diastolic dysfunction (impaired  relaxation).   2. Right ventricular systolic function is normal. The right ventricular  size is normal.   3. Left atrial size was mildly dilated.   4. The  mitral valve is normal in structure. Trivial mitral valve  regurgitation.   5. The aortic valve is normal in structure. There is mild calcification  of the aortic valve. There is mild thickening of the aortic valve. Aortic  valve regurgitation is not visualized.   6. The inferior vena cava is normal in size with greater than 50%  respiratory variability, suggesting right atrial pressure of 3 mmHg.   Patient Profile     59 y.o. male with hx of CHF, HTN urgency, COPD, HLD, aortic atherosclerosis, current everyday smoker.  Assessment & Plan    Acute on chronic systolic heart failure - echo here with EF 25% without WMA - pt reports heart cath that was "normal" one year ago, have not seen records - transitioned to lasix 40 mg lasix BID - working with applications for [employer]; so will start losartan 50 mg starting tomorrow, 25 mg coreg BID - pt euvolemic today - discharge weight is 195 lbs - BMP in 1 week - he is considering enrolling in SGLT2i study - discussed low sodium diet and daily weights  Hypertensive urgency - medications as above - pressure well controlled on present medication regimen  Aortic atherosclerosis Hyperlipidemia with LDL goal < 70 - will attempt to add on lipid profile to this morning's labs - If not, collect at follow up - continue ASA and 20 mg lipitor  Current smoker - encouraged cessation - currently wearing nicotine patch or gum- consider prescribing at discharge   Disposition No insurance, expects to have insurance in Jan. Have provided information for community health and wellness.  Would benefit from outpatient social work help. Will explore this at outpatient follow up.   He is interested in [facility] pharmacy at discharge.  OK to discharge from a cardiology standpoint.    [facility] will sign off.   Medication Recommendations:  In [facility] Other recommendations (labs, testing, etc):  BMP in 1 week Follow up as an outpatient:  Will schedule cards  follow up in 2 weeks  For questions or updates, please contact [facility] Please consult [email or web address] for contact info under        Signed, Marcelino Duster, PA  08/10/2020, 9:53 AM    Personally seen and examined. Agree with APP above with the following comments:  Briefly 59 yo M with prior LCP at [facility], HFrEF EF 25% coupld with Aortic atherosclerosis, HLD, and smoking Patient feels the best he has in weeks.  Ambulating without issue. No JVD, no peripheral edema Echo personally reviewed:  EF 25% no WMAs RV function WNL No events on tele SHARED DECISION MAKING:  Discussed repeat [facility] vs attempting to get records from [facility] prior; will defer at this time  Discussed 1800 Quit now and nicotine replacement gum Patient enrolling in DAPA inpatient trial Coreg 25 mg BID and losartan 50 mg daily;  attempting to enroll in [employer] trial:  Will start Entresto as outpatient so he can enroll.  90 day repeat echo for ICD eval pending insurance issues  Christell Constant, MD

## 2020-08-10 NOTE — Discharge Summary (Signed)
Physician Discharge Summary  [name] AVW:[id number] DOB: October 21, 1960 DOA: 08/08/2020  PCP: Patient, [name]  Admit date: 08/08/2020 Discharge date: 08/10/2020  Admitted From: home Disposition:  home  Recommendations for Outpatient Follow-up:  1. Encourage medication compliance and smoking cessation  Home Health:  none  Discharge Condition:  stable   CODE STATUS:  Full code   Diet recommendation:  Heart healthy Consultations:  cardiology  Procedures/Studies: . ECHO   Discharge Diagnoses:  Principal Problem:   Acute on chronic combined systolic and diastolic CHF (congestive heart failure) (HCC) Active Problems:   Acute bronchitis- COPD (chronic obstructive pulmonary disease) (HCC)   Hypertensive urgency   Chest pain   Tobacco abuse   Chronic pain    Brief Summary: Cody Carney is a 59 y/o male CHF, COPD, hypertension and tobacco abuse presenting with 4 days of substernal chest pain and difficulty breathing. Chest pain is described as heaviness, elephant sitting on his chest and it has been essentially persistent for 4 days. He had a new cough this week and has been coughing up colored sputum.   In ED > pulse ox in 90, temp 99.3, HR in low 100s, BP 171/103, 152/102  CTA> no PE, stable mediastinal and right hilar lymphadenopathy  Hospital Course:  Principal Problem:   Acute on chronic unspecified CHF (congestive heart failure) hypertensive urgency - BMP 428.6 - treated with IV Lasix per cardiology, Lisinopril doubled from 5-10, cont Coreg at 25 mig BID -  ECHO reveals an EF of 25% with grade 1 d CHF -  Lisinopril change to Losartan 50 mg (may be able to get Entresto as outpt) - started on SGLT2 trial and will get free bottle of med today - need repeat echo in 90 days- cardiology will follow as outpt  Active Problems: Chest pain - related to dyspnea?  - Troponin in 40s    COPD (chronic obstructive pulmonary disease) - nicotine abuse Acute bronchitis -  lymphadenopathy noted on CT- increased cough and colored sputum- start Doxycyline for 5 days - he has inhalers but does not recall the name - currently no wheezing- cont Nebs PRN - no need for steroids - encouraged to stop smoking- he is adamant that he will quit- he has reduced his smoking from 2ppd to less than 1 ppd      Chronic back pain - cont narcotics  No insurance - will gets meds from [facility] pharmacy   Discharge Exam: Vitals:   08/10/20 0337 08/10/20 1128  BP: 107/67 (!) 91/59  Pulse: 66 77  Resp: 16 20  Temp: 97.7 F (36.5 C) 98 F (36.7 C)  SpO2: 97% 96%   Vitals:   08/09/20 1718 08/09/20 1955 08/10/20 0337 08/10/20 1128  BP: (!) 107/92 92/60 107/67 (!) 91/59  Pulse: 98 89 66 77  Resp:  Temp:  98.3 F (36.8 C) 97.7 F (36.5 C) 98 F (36.7 C)  TempSrc:  Oral Oral Oral  SpO2:  98% 97% 96%  Weight:   88.5 kg   Height:        General: Pt is alert, awake, not in acute distress Cardiovascular: RRR, S1/S2 +, no rubs, no gallops Respiratory: CTA bilaterally, no wheezing, no rhonchi Abdominal: Soft, NT, ND, bowel sounds + Extremities: no edema, no cyanosis   Discharge Instructions  Discharge Instructions    (HEART FAILURE PATIENTS) Call MD:  Anytime you have any of the following symptoms: 1) 3 pound weight gain in 24 hours or  5 pounds in 1 week 2) shortness of breath, with or without a dry hacking cough 3) swelling in the hands, feet or stomach 4) if you have to sleep on extra pillows at night in order to breathe.   Complete by: As directed    Diet - low sodium heart healthy   Complete by: As directed    Increase activity slowly   Complete by: As directed      Allergies as of 08/10/2020   No Known Allergies     Medication List    STOP taking these medications   lisinopril 5 MG tablet Commonly known as: ZESTRIL     TAKE these medications   aspirin 81 MG EC tablet Take 81 mg by mouth daily.   atorvastatin 20 MG tablet Commonly known as:  LIPITOR Take 20 mg by mouth daily.   carvedilol 25 MG tablet Commonly known as: COREG Take 25 mg by mouth in the morning and at bedtime.   DAPA TIMI 68 dapagliflozin or placebo 10 mg Tabs tablet Take 1 tablet by mouth daily.   doxycycline 100 MG tablet Commonly known as: VIBRA-TABS Take 1 tablet (100 mg total) by mouth every 12 (twelve) hours.   furosemide 40 MG tablet Commonly known as: LASIX Take 1 tablet (40 mg total) by mouth 2 (two) times daily. What changed: when to take this   losartan 50 MG tablet Commonly known as: COZAAR Take 1 tablet (50 mg total) by mouth daily. Start taking on: August 11, 2020   nicotine 21 mg/24hr patch Commonly known as: NICODERM CQ - dosed in mg/24 hours Place 1 patch (21 mg total) onto the skin daily. Start taking on: August 11, 2020   potassium chloride 10 MEQ tablet Commonly known as: KLOR-CON Take 1 tablet (10 mEq total) by mouth 2 (two) times daily.       Follow-up Information    [facility]. Go on 08/25/2020.   Why: :10am Contact information: [address] [place] [address] [phone]       Christell Constant, MD Follow up in 2 week(s).   Specialty: Cardiology Contact information: [address] [place] [address] [phone]              No Known Allergies    DG Chest 2 View  Result Date: 08/08/2020 CLINICAL DATA:  Chest pain. Shortness of breath. History of congestive heart failure. EXAM: CHEST - 2 VIEW COMPARISON:  August 08, 2020 FINDINGS: The heart, hila, and mediastinum are normal. No pulmonary nodules or masses. No focal infiltrates or overt edema. IMPRESSION: No active cardiopulmonary disease. Electronically Signed   By: Gerome Sam III M.D   On: 08/08/2020 15:05   CT Angio Chest PE W/Cm &/Or Wo Cm  Result Date: 08/08/2020 CLINICAL DATA:  Elevated D-dimer. EXAM: CT ANGIOGRAPHY CHEST WITH CONTRAST TECHNIQUE:  Multidetector CT imaging of the chest was performed using the standard protocol during bolus administration of intravenous contrast. Multiplanar CT image reconstructions and MIPs were obtained to evaluate the vascular anatomy. CONTRAST:  OMNIPAQUE IOHEXOL 350 MG/ML SOLN COMPARISON:  October 12, 2019 FINDINGS: Cardiovascular: There is moderate severity calcification of the aortic arch and descending thoracic aorta. Satisfactory opacification of the pulmonary arteries to the segmental level. No evidence of pulmonary embolism. Normal heart size. No pericardial effusion. Mediastinum/Nodes: Stable 1.8 cm x 1.7 cm and 2.5 cm x 1.1 cm pretracheal lymph nodes are seen. Additional stable 1.5 cm x  1.0 cm and 2.4 cm x 1.4 cm AP window lymph nodes are also noted. Mild right hilar lymphadenopathy is present. Thyroid gland, trachea, and esophagus demonstrate no significant findings. Lungs/Pleura: Lungs are clear. No pleural effusion or pneumothorax. Upper Abdomen: No acute abnormality. Musculoskeletal: Multilevel degenerative changes seen throughout the thoracic spine. Review of the MIP images confirms the above findings. IMPRESSION: 1. No evidence of pulmonary embolus. 2. Stable mediastinal and right hilar lymphadenopathy. 3. Aortic atherosclerosis. Aortic Atherosclerosis (ICD10-I70.0). Electronically Signed   By: Aram Candela M.D.   On: 08/08/2020 20:43   ECHOCARDIOGRAM COMPLETE  Result Date: 08/09/2020    ECHOCARDIOGRAM REPORT   Patient Name:   Cody Carney Date of Exam: 08/09/2020 Medical Rec #:  [id number]  Height:       68.0 in Accession #:    [id number] Weight:       195.4 lb Date of Birth:  Dec 06, 1960  BSA:          2.024 m Patient Age:    58 years   BP:           105/72 mmHg Patient Gender: M          HR:           92 bpm. Exam Location:  Inpatient Procedure: 2D Echo, Color Doppler and Cardiac Doppler Indications:    I50.21 Acute systolic (congestive) heart failure  History:        Patient has no prior history  of Echocardiogram examinations.                 CHF, COPD; Risk Factors:Hypertension.  Sonographer:    Irving Burton Senior RDCS Referring Phys: [id number] MOHAMMAD L GARBA IMPRESSIONS  1. Left ventricular ejection fraction, by estimation, is 25 to 30%. The left ventricle has severely decreased function. The left ventricle demonstrates global hypokinesis. The left ventricular internal cavity size was moderately dilated. There is mild concentric left ventricular hypertrophy. Left ventricular diastolic parameters are consistent with Grade I diastolic dysfunction (impaired relaxation).  2. Right ventricular systolic function is normal. The right ventricular size is normal.  3. Left atrial size was mildly dilated.  4. The mitral valve is normal in structure. Trivial mitral valve regurgitation.  5. The aortic valve is normal in structure. There is mild calcification of the aortic valve. There is mild thickening of the aortic valve. Aortic valve regurgitation is not visualized.  6. The inferior vena cava is normal in size with greater than 50% respiratory variability, suggesting right atrial pressure of 3 mmHg. Comparison(s): No prior Echocardiogram. FINDINGS  Left Ventricle: Left ventricular ejection fraction, by estimation, is 25 to 30%. The left ventricle has severely decreased function. The left ventricle demonstrates global hypokinesis. The left ventricular internal cavity size was moderately dilated. There is mild concentric left ventricular hypertrophy. Left ventricular diastolic parameters are consistent with Grade I diastolic dysfunction (impaired relaxation). Right Ventricle: The right ventricular size is normal. Right vetricular wall thickness was not well visualized. Right ventricular systolic function is normal. Left Atrium: Left atrial size was mildly dilated. Right Atrium: Right atrial size was normal in size. Pericardium: There is no evidence of pericardial effusion. Mitral Valve: The mitral valve is normal in  structure. There is mild thickening of the mitral valve leaflet(s). Trivial mitral valve regurgitation. Tricuspid Valve: The tricuspid valve is grossly normal. Tricuspid valve regurgitation is trivial. Aortic Valve: The aortic valve is normal in structure. There is mild calcification of the aortic valve. There is mild thickening of the  aortic valve. Aortic valve regurgitation is not visualized. Pulmonic Valve: The pulmonic valve was normal in structure. Pulmonic valve regurgitation is not visualized. Aorta: The aortic root and ascending aorta are structurally normal, with no evidence of dilitation. Venous: The inferior vena cava is normal in size with greater than 50% respiratory variability, suggesting right atrial pressure of 3 mmHg. IAS/Shunts: No atrial level shunt detected by color flow Doppler.  LEFT VENTRICLE PLAX 2D LVIDd:         5.53 cm      Diastology LVIDs:         4.62 cm      LV e' medial:    0.04 cm/s LV PW:         1.19 cm      LV E/e' medial:  14.0 LV IVS:        0.97 cm      LV e' lateral:   0.07 cm/s LVOT diam:     2.20 cm      LV E/e' lateral: 9.4 LV SV:         58 LV SV Index:   29 LVOT Area:     3.80 cm  LV Volumes (MOD) LV vol d, MOD A2C: 225.0 ml LV vol d, MOD A4C: 177.0 ml LV vol s, MOD A2C: 149.0 ml LV vol s, MOD A4C: 125.0 ml LV SV MOD A2C:     76.0 ml LV SV MOD A4C:     177.0 ml LV SV MOD BP:      76.0 ml RIGHT VENTRICLE RV S prime:     12.50 cm/s TAPSE (M-mode): 2.3 cm LEFT ATRIUM           Index       RIGHT ATRIUM           Index LA diam:      3.80 cm 1.88 cm/m  RA Area:     19.00 cm LA Vol (A2C): 78.8 ml 38.94 ml/m RA Volume:   55.80 ml  27.57 ml/m LA Vol (A4C): 79.4 ml 39.24 ml/m  AORTIC VALVE LVOT Vmax:   92.40 cm/s LVOT Vmean:  70.100 cm/s LVOT VTI:    0.152 m  AORTA Ao Root diam: 3.40 cm Ao Asc diam:  3.30 cm MITRAL VALVE MV Area (PHT): 3.74 cm    SHUNTS MV Decel Time: 203 msec    Systemic VTI:  0.15 m MV E velocity: 0.61 cm/s   Systemic Diam: 2.20 cm MV A velocity: 86.30  cm/s MV E/A ratio:  0.01 Laurance Flatten MD Electronically signed by Laurance Flatten MD Signature Date/Time: 08/09/2020/4:12:09 PM    Final      The results of significant diagnostics from this hospitalization (including imaging, microbiology, ancillary and laboratory) are listed below for reference.     Microbiology: Recent Results (from the past 240 hour(s))  Respiratory Panel by RT PCR (Flu A&B, Covid) - Nasopharyngeal Swab     Status: None   Collection Time: 08/08/20  4:23 PM   Specimen: Nasopharyngeal Swab  Result Value Ref Range Status   SARS Coronavirus 2 by RT PCR NEGATIVE NEGATIVE Final    Comment: (NOTE) SARS-CoV-2 target nucleic acids are NOT DETECTED.  The SARS-CoV-2 RNA is generally detectable in upper respiratoy specimens during the acute phase of infection. The lowest concentration of SARS-CoV-2 viral copies this assay can detect is 131 copies/mL. A negative result does not preclude SARS-Cov-2 infection and should not be used as the sole basis for treatment or  other patient management decisions. A negative result may occur with  improper specimen collection/handling, submission of specimen other than nasopharyngeal swab, presence of viral mutation(s) within the areas targeted by this assay, and inadequate number of viral copies (<131 copies/mL). A negative result must be combined with clinical observations, patient history, and epidemiological information. The expected result is Negative.  Fact Sheet for Patients:  https://www.moore.com/  Fact Sheet for Healthcare Providers:  https://www.young.biz/  This test is no t yet approved or cleared by the [place] FDA and  has been authorized for detection and/or diagnosis of SARS-CoV-2 by FDA under an Emergency Use Authorization (EUA). This EUA will remain  in effect (meaning this test can be used) for the duration of the COVID-19 declaration under Section 564(b)(1) of the  Act, 21 U.S.C. section 360bbb-3(b)(1), unless the authorization is terminated or revoked sooner.     Influenza A by PCR NEGATIVE NEGATIVE Final   Influenza B by PCR NEGATIVE NEGATIVE Final    Comment: (NOTE) The Xpert Xpress SARS-CoV-2/FLU/RSV assay is intended as an aid in  the diagnosis of influenza from Nasopharyngeal swab specimens and  should not be used as a sole basis for treatment. Nasal washings and  aspirates are unacceptable for Xpert Xpress SARS-CoV-2/FLU/RSV  testing.  Fact Sheet for Patients: https://www.moore.com/  Fact Sheet for Healthcare Providers: https://www.young.biz/  This test is not yet approved or cleared by the [place] FDA and  has been authorized for detection and/or diagnosis of SARS-CoV-2 by  FDA under an Emergency Use Authorization (EUA). This EUA will remain  in effect (meaning this test can be used) for the duration of the  Covid-19 declaration under Section 564(b)(1) of the Act, 21  U.S.C. section 360bbb-3(b)(1), unless the authorization is  terminated or revoked. Performed at [facility], [address], [place], [place] [address]      Labs: BNP (last 3 results) Recent Labs    08/08/20 1508 08/10/20 0325  BNP 428.6* 113.8*   Basic Metabolic Panel: Recent Labs  Lab 08/08/20 1507 08/09/20 0115 08/10/20 0802  NA 135  --  137  K 3.9  --  3.9  CL 103  --  103  CO2 20*  --  23  GLUCOSE 127*  --  154*  BUN 11  --  34*  CREATININE 0.80 0.95 1.12  CALCIUM 9.6  --  9.4   Liver Function Tests: No results for input(s): AST, ALT, ALKPHOS, BILITOT, PROT, ALBUMIN in the last 168 hours. No results for input(s): LIPASE, AMYLASE in the last 168 hours. No results for input(s): AMMONIA in the last 168 hours. CBC: Recent Labs  Lab 08/08/20 1507 08/09/20 0115 08/09/20 0359  WBC 9.3 6.1 3.8*  NEUTROABS  --   --  3.4  HGB 15.0 15.9 15.7  HCT 45.4 47.7 46.7  MCV 93.0 92.8 93.8  PLT 152  167 137*   Cardiac Enzymes: No results for input(s): CKTOTAL, CKMB, CKMBINDEX, TROPONINI in the last 168 hours. BNP: Invalid input(s): POCBNP CBG: No results for input(s): GLUCAP in the last 168 hours. D-Dimer Recent Labs    08/08/20 1701  DDIMER 0.86*   Hgb A1c No results for input(s): HGBA1C in the last 72 hours. Lipid Profile No results for input(s): CHOL, HDL, LDLCALC, TRIG, CHOLHDL, LDLDIRECT in the last 72 hours. Thyroid function studies No results for input(s): TSH, T4TOTAL, T3FREE, THYROIDAB in the last 72 hours.  Invalid input(s): FREET3 Anemia work up No results for input(s): VITAMINB12, FOLATE, FERRITIN,  TIBC, IRON, RETICCTPCT in the last 72 hours. Urinalysis No results found for: COLORURINE, APPEARANCEUR, LABSPEC, PHURINE, GLUCOSEU, HGBUR, BILIRUBINUR, KETONESUR, PROTEINUR, UROBILINOGEN, NITRITE, LEUKOCYTESUR Sepsis Labs Invalid input(s): PROCALCITONIN,  WBC,  LACTICIDVEN Microbiology Recent Results (from the past 240 hour(s))  Respiratory Panel by RT PCR (Flu A&B, Covid) - Nasopharyngeal Swab     Status: None   Collection Time: 08/08/20  4:23 PM   Specimen: Nasopharyngeal Swab  Result Value Ref Range Status   SARS Coronavirus 2 by RT PCR NEGATIVE NEGATIVE Final    Comment: (NOTE) SARS-CoV-2 target nucleic acids are NOT DETECTED.  The SARS-CoV-2 RNA is generally detectable in upper respiratoy specimens during the acute phase of infection. The lowest concentration of SARS-CoV-2 viral copies this assay can detect is 131 copies/mL. A negative result does not preclude SARS-Cov-2 infection and should not be used as the sole basis for treatment or other patient management decisions. A negative result may occur with  improper specimen collection/handling, submission of specimen other than nasopharyngeal swab, presence of viral mutation(s) within the areas targeted by this assay, and inadequate number of viral copies (<131 copies/mL). A negative result must be  combined with clinical observations, patient history, and epidemiological information. The expected result is Negative.  Fact Sheet for Patients:  https://www.moore.com/  Fact Sheet for Healthcare Providers:  https://www.young.biz/  This test is no t yet approved or cleared by the [place] FDA and  has been authorized for detection and/or diagnosis of SARS-CoV-2 by FDA under an Emergency Use Authorization (EUA). This EUA will remain  in effect (meaning this test can be used) for the duration of the COVID-19 declaration under Section 564(b)(1) of the Act, 21 U.S.C. section 360bbb-3(b)(1), unless the authorization is terminated or revoked sooner.     Influenza A by PCR NEGATIVE NEGATIVE Final   Influenza B by PCR NEGATIVE NEGATIVE Final    Comment: (NOTE) The Xpert Xpress SARS-CoV-2/FLU/RSV assay is intended as an aid in  the diagnosis of influenza from Nasopharyngeal swab specimens and  should not be used as a sole basis for treatment. Nasal washings and  aspirates are unacceptable for Xpert Xpress SARS-CoV-2/FLU/RSV  testing.  Fact Sheet for Patients: https://www.moore.com/  Fact Sheet for Healthcare Providers: https://www.young.biz/  This test is not yet approved or cleared by the [place] FDA and  has been authorized for detection and/or diagnosis of SARS-CoV-2 by  FDA under an Emergency Use Authorization (EUA). This EUA will remain  in effect (meaning this test can be used) for the duration of the  Covid-19 declaration under Section 564(b)(1) of the Act, 21  U.S.C. section 360bbb-3(b)(1), unless the authorization is  terminated or revoked. Performed at [facility], [address] [place], [place] [address]      Time coordinating discharge in minutes: 65  SIGNED:   Calvert Cantor, MD  [facility] 08/10/2020, 3:49 PM

## 2020-08-17 ENCOUNTER — Encounter: Payer: Self-pay | Admitting: *Deleted

## 2020-08-17 ENCOUNTER — Other Ambulatory Visit: Payer: Self-pay

## 2020-08-17 VITALS — BP 110/65 | HR 94 | Ht 68.0 in | Wt 192.0 lb

## 2020-08-17 DIAGNOSIS — Z006 Encounter for examination for normal comparison and control in clinical research program: Secondary | ICD-10-CM

## 2020-08-17 NOTE — Research (Addendum)
   Week 1 Visit Worksheet  PATIENT ID: [id number]   DATE OF CONTACT: 09/Nov/2021   Type of Visit*:   *Visit should be done in person whenever possible  [x]  Clinic Visit                         []  Telephone Contact with Patient     []  Family Member or Caregiver  []  Primary Care Physician   []  Medical Record Review                    []  Other (specify)____________  1. Did patient experience any AEs leading to study drug discontinuation, SAEs related to study drug, protocol-defined unexpected/unanticipated events, clinical endpoints (death, worsening HF), or adverse events of special interest since the last visit?  []     Yes*  [x]     No  *If yes, please record in [employer] EDC (eCRF)  2.   HF Signs and Symptoms Assessed by: , RN  3.     Vital Signs  Pulse 94 bpm  Height 68 []  cm  [x]   in (xxx.xx)  B/P 110/65 mmHg  Body Weight 192 []  kg   [x]   lbs  (xxx.xx)   4.     Concomitant medications Enter current heart failure and diabetes medications in the eCRF   5.    Is Patient currently taking study drug?  [x]  Yes    or   []  No*     *If no, describe reason:    6.   Date last tablet taken:  08/17/2020  Did patient bring bottles with them to visit? [x]  Yes []  No  Bottle number 1:  [id number]  Opened? [x]   Y   []  N Number of tablets remaining 27 Bottle number 2:  [id number]   Opened? []  Y    [x]  N Number of tablets remaining 35  How many days did patient NOT take the study drug since last visit?  0 Days  Investigator judgment of Compliance: [x]  Reasonable (? 20% deviation from expected)        []  Questionable (>20% deviation from expected)           1. 7.  Local Labs Performed Creatinine   0.79 mg/dl Date: Potassium   4.1 mmol/L Date: 08/17/2020  8.   Other Items  [x]  Schedule Month 1 Follow Up Visit: Thursday, September 09, 2020 @ 3:00pm  []  Schedule Month 2 Follow Up Visit __________________________  [x]  Remind patient of importance of complying with study  medication (1 pill per day)  [x]  Remind patient to bring both study medication bottles (even if empty) to next clinic visit  [x]  Review Patient Contact Information Sheet, update if necessary   DAPA ACT HF-TIMI 68 Week 1 Visit Worksheet       Version 2.0 Date 13 May 2020

## 2020-08-18 LAB — BASIC METABOLIC PANEL
BUN/Creatinine Ratio: 19 (ref 9–20)
BUN: 15 mg/dL (ref 6–24)
CO2: 19 mmol/L — ABNORMAL LOW (ref 20–29)
Calcium: 9.2 mg/dL (ref 8.7–10.2)
Chloride: 99 mmol/L (ref 96–106)
Creatinine, Ser: 0.79 mg/dL (ref 0.76–1.27)
GFR calc Af Amer: 115 mL/min/{1.73_m2} (ref >59)
GFR calc non Af Amer: 100 mL/min/{1.73_m2} (ref >59)
Glucose: 96 mg/dL (ref 65–99)
Potassium: 4.1 mmol/L (ref 3.5–5.2)
Sodium: 135 mmol/L (ref 134–144)

## 2020-08-19 NOTE — Progress Notes (Deleted)
Patient ID: [name], male   DOB: 10-Oct-1960, 59 y.o.   MRN: [id number]   After hospitalization 10/31-11/11/2019  For acute CHF  From discharge summary: Discharge Diagnoses:  Principal Problem:   Acute on chronic combined systolic and diastolic CHF (congestive heart failure) (HCC) Active Problems:   Acute bronchitis- COPD (chronic obstructive pulmonary disease) (HCC)   Hypertensive urgency   Chest pain   Tobacco abuse   Chronic pain    Brief Summary: Cody Carney a 59 y/o male CHF, COPD, hypertension and tobacco abuse presenting with 4 days of substernal chest pain and difficulty breathing. Chest pain is described as heaviness, elephant sitting on his chest and it has been essentially persistent for 4 days. He had a new cough this week and has been coughing up colored sputum.   In ED > pulse ox in 90, temp 99.3, HR in low 100s, BP 171/103, 152/102 CTA>no PE, stable mediastinal and right hilar lymphadenopathy  Hospital Course:  Principal Problem: Acute on chronic unspecified CHF (congestive heart failure) hypertensive urgency - BMP 428.6 - treated with IV Lasix per cardiology, Lisinopril doubled from 5-10, cont Coreg at 25 mig BID -  ECHO reveals an EF of 25% with grade 1 d CHF -  Lisinopril change to Losartan 50 mg (may be able to get Entresto as outpt) - started on SGLT2 trial and will get free bottle of med today - need repeat echo in 90 days- cardiology will follow as outpt  Active Problems: Chest pain - related to dyspnea?  - Troponin in 40s  COPD (chronic obstructive pulmonary disease) - nicotine abuse Acute bronchitis - lymphadenopathy noted on CT- increased cough and colored sputum- start Doxycyline for 5 days - he has inhalers but does not recall the name - currently no wheezing- cont Nebs PRN - no need for steroids - encouraged to stop smoking- he is adamant that he will quit- he has reduced his smoking from 2ppd to less than 1 ppd   Chronic  back pain - cont narcotics  No insurance - will gets meds from [facility] pharmacy

## 2020-08-25 ENCOUNTER — Inpatient Hospital Stay: Payer: Self-pay | Admitting: Physician Assistant

## 2020-08-27 ENCOUNTER — Ambulatory Visit: Payer: Self-pay | Admitting: Internal Medicine

## 2020-09-10 ENCOUNTER — Encounter: Payer: Self-pay | Admitting: *Deleted

## 2020-09-10 ENCOUNTER — Other Ambulatory Visit: Payer: Self-pay

## 2020-09-10 ENCOUNTER — Ambulatory Visit: Payer: Self-pay | Admitting: Internal Medicine

## 2020-09-10 DIAGNOSIS — Z006 Encounter for examination for normal comparison and control in clinical research program: Secondary | ICD-10-CM

## 2020-09-10 NOTE — Research (Addendum)
   Month 1 Visit Worksheet  PATIENT ID: [id number]   DATE OF CONTACT:  December 3rd, 2021   Type of Visit*:   *Visit should be done in person whenever possible  [x]  Clinic Visit                         []  Telephone Contact with Patient     []  Family Member or Caregiver  []  Primary Care Physician   []  Medical Record Review                    []  Other (specify)____________  1. Did patient experience any AEs leading to study drug discontinuation, SAEs related to study drug, protocol-defined unexpected/unanticipated events, clinical endpoints (death, worsening HF), or adverse events of special interest since the last visit?  []     Yes*  [x]      No *If yes, please record in [employer] EDC (eCRF)  2.  HF Signs and Symptoms Assessed by: , RN   3.   Vital Signs  Pulse: 72 bpm   Height: 68 []  cm  [x]   in (xxx.xx)  B/P: 103/51 mmHg  Body Weight: 194 []  kg   [x]   lbs  (xxx.xx)    4.   Concomitant medications Enter current heart failure and diabetes medications in the eCRF  5.   Is Patient currently taking study drug?  [x]  Yes    or   []  No*        *If no, describe reason:   6.   Study Drug Compliance  Date last tablet taken:  December 3rd, 2021  Did patient bring bottles with them to visit? [x]  Yes []  No  Bottle number 1:  [id number]   Opened?  Yes  Number of tablets remaining 26  Bottle number 2:  [id number]   Opened?  Yes  Number of tablets remaining 14  How many days did patient NOT take the study drug since last visit?   0 Days  Investigator judgment of Compliance: [x]  Reasonable (? 20% deviation from expected)        []  Questionable (>20% deviation from expected   7.   Local Labs Performed Creatinine   1.31 mg/dl Date: Potassium   3.9 mmol/L  Date: 09/10/2020 (Local Labs Performed)  Creatinine  [x]  Yes  []  No Potassium  [x]   Yes []  No .    8.   Other Items  [x]  Schedule Month 2 Follow Up Visit: Thursday January 6th, 2022 @ 2pm  [x]  Remind patient of  importance of complying with study medication (1 pill per day)  [x]  Remind patient to bring both study medication bottles (even if empty) to next clinic visit  [x]  Review Patient Contact Information Sheet, update if necessary             DAPA ACT HF-TIMI 68     Month 1 Visit Worksheet   Version 2.0 Date:  13 May 2020

## 2020-09-10 NOTE — Progress Notes (Deleted)
Cardiology Office Note:    Date:  09/10/2020   ID:  Cody Carney, DOB 1960-11-23, MRN [id number]  PCP:  Patient, [name]  [facility] Cardiologist:  Christell Constant, MD  [facility] Electrophysiologist:  None   CC: Outpatient follow up After  History of Present Illness:    Cody Carney is a 59 y.o. male with a hx of HFrEF, Aortic Atherosclerosis and Coronary Artery Calcifications with prior normal LCP at [facility] at some point) COPD, Thoracic Aortic Aneurysm last 39 mm, Tobacco Abuse seen 08/08/20.  Patient notes that (s)he is doing ***.  Since day prior/last visit notes *** changes.  Relevant interval testing or therapy include ***.  There are no*** interval hospital/ED visit.    No chest pain or pressure ***.  No SOB/DOE*** and no PND/Orthopnea***.  No weight gain or leg swelling***.  No palpitations or syncope ***.  Ambulatory blood pressure ***.   Past Medical History:  Diagnosis Date  . CHF (congestive heart failure) (HCC)   . COPD (chronic obstructive pulmonary disease) (HCC)   . Hypertension     Past Surgical History:  Procedure Laterality Date  . LEG SURGERY      Current Medications: No outpatient medications have been marked as taking for the 09/10/20 encounter (Appointment) with Christell Constant, MD.     Allergies:   Patient has no known allergies.   Social History   Socioeconomic History  . Marital status: Single    Spouse name: Not on file  . Number of children: Not on file  . Years of education: Not on file  . Highest education level: Not on file  Occupational History  . Not on file  Tobacco Use  . Smoking status: Current Every Day Smoker  . Smokeless tobacco: Never Used  Substance and Sexual Activity  . Alcohol use: Yes  . Drug use: No  . Sexual activity: Not on file  Other Topics Concern  . Not on file  Social History Narrative  . Not on file   Social Determinants of Health   Financial Resource Strain:   . Difficulty of  Paying Living Expenses: Not on file  Food Insecurity:   . Worried About Programme researcher, broadcasting/film/video in the Last Year: Not on file  . Ran Out of Food in the Last Year: Not on file  Transportation Needs:   . Lack of Transportation (Medical): Not on file  . Lack of Transportation (Non-Medical): Not on file  Physical Activity:   . Days of Exercise per Week: Not on file  . Minutes of Exercise per Session: Not on file  Stress:   . Feeling of Stress : Not on file  Social Connections:   . Frequency of Communication with Friends and Family: Not on file  . Frequency of Social Gatherings with Friends and Family: Not on file  . Attends Religious Services: Not on file  . Active Member of Clubs or Organizations: Not on file  . Attends Banker Meetings: Not on file  . Marital Status: Not on file     Family History: Denies family history of sudden cardiac death including drowning, car accidents, or unexplained deaths in the family.*** No history of non-ischemic cardiomyopathies including hypertrophic cardiomyopathy, left ventricular non-compaction, or arrhythmogenic right ventricular cardiomyopathy.*** No history of bicuspid aortic valve or aortic aneurysm or dissection. History of coronary artery disease notable for ***. History of arrhythmia notable for ***.  ROS:   Please see the history of present  illness.    *** All other systems reviewed and are negative.  EKGs/Labs/Other Studies Reviewed:    The following studies were reviewed today: ***  EKG:  EKG is *** ordered today.  The ekg ordered today demonstrates ***  Recent Labs: 08/09/2020: Hemoglobin 15.7; Platelets 137 08/10/2020: B Natriuretic Peptide 113.8 08/17/2020: BUN 15; Creatinine, Ser 0.79; Potassium 4.1; Sodium 135  Recent Lipid Panel No results found for: CHOL, TRIG, HDL, CHOLHDL, VLDL, LDLCALC, LDLDIRECT   Risk Assessment/Calculations:   {Does this patient have ATRIAL FIBRILLATION?[phone]}   Physical Exam:     VS:  There were no vitals taken for this visit.    Wt Readings from Last 3 Encounters:  08/17/20 192 lb (87.1 kg)  08/10/20 195 lb 1.6 oz (88.5 kg)     GEN: *** Well nourished, well developed in no acute distress HEENT: Normal NECK: No JVD; No carotid bruits LYMPHATICS: No lymphadenopathy CARDIAC: ***RRR, no murmurs, rubs, gallops RESPIRATORY:  Clear to auscultation without rales, wheezing or rhonchi  ABDOMEN: Soft, non-tender, non-distended MUSCULOSKELETAL:  No edema; No deformity  SKIN: Warm and dry NEUROLOGIC:  Alert and oriented x 3 PSYCHIATRIC:  Normal affect   ASSESSMENT:    No diagnosis found. PLAN:    In order of problems listed above:  1. ***    Shared Decision Making/Informed Consent   {Are you ordering a CV Procedure (e.g. stress test, cath, DCCV, TEE, etc)?   Press F2        :[id number]}    Medication Adjustments/Labs and Tests Ordered: Current medicines are reviewed at length with the patient today.  Concerns regarding medicines are outlined above.  No orders of the defined types were placed in this encounter.  No orders of the defined types were placed in this encounter.   There are no Patient Instructions on file for this visit.   Signed, Christell Constant, MD  09/10/2020 8:59 AM    [facility]

## 2020-09-11 LAB — BASIC METABOLIC PANEL
BUN/Creatinine Ratio: 14 (ref 9–20)
BUN: 18 mg/dL (ref 6–24)
CO2: 20 mmol/L (ref 20–29)
Calcium: 9.2 mg/dL (ref 8.7–10.2)
Chloride: 101 mmol/L (ref 96–106)
Creatinine, Ser: 1.31 mg/dL — ABNORMAL HIGH (ref 0.76–1.27)
GFR calc Af Amer: 69 mL/min/{1.73_m2} (ref >59)
GFR calc non Af Amer: 60 mL/min/{1.73_m2} (ref >59)
Glucose: 139 mg/dL — ABNORMAL HIGH (ref 65–99)
Potassium: 3.9 mmol/L (ref 3.5–5.2)
Sodium: 137 mmol/L (ref 134–144)

## 2020-09-17 ENCOUNTER — Ambulatory Visit: Payer: Self-pay | Admitting: Internal Medicine

## 2020-09-17 NOTE — Progress Notes (Deleted)
Cardiology Office Note:    Date:  09/17/2020   ID:  Cody Carney, DOB November 29, 1960, MRN [id number]  PCP:  Patient, [name]  [facility] Cardiologist:  Christell Constant, MD  [facility] Electrophysiologist:  None   CC:  Follow up HF  History of Present Illness:    Cody Carney is a 59 y.o. male with a hx of HTN urgency, Aortic atherosclerosis with Coronary Artery Calcifications, Tobacco Abuse, HFrEF EF 25-30%, COPD admitted with SOB 08/08/20.  Patient notes that (s)he is doing ***.  Since day prior/last visit notes *** changes.  Relevant interval testing or therapy include ***.  There are no*** interval hospital/ED visit.  Have yet to obtain prior heart catheterization at ***  No chest pain or pressure ***.  No SOB/DOE*** and no PND/Orthopnea***.  No weight gain or leg swelling***.  No palpitations or syncope ***.  Ambulatory blood pressure ***.   Past Medical History:  Diagnosis Date  . CHF (congestive heart failure) (HCC)   . COPD (chronic obstructive pulmonary disease) (HCC)   . Hypertension     Past Surgical History:  Procedure Laterality Date  . LEG SURGERY      Current Medications: No outpatient medications have been marked as taking for the 09/17/20 encounter (Appointment) with Christell Constant, MD.     Allergies:   Patient has no known allergies.   Social History   Socioeconomic History  . Marital status: Single    Spouse name: Not on file  . Number of children: Not on file  . Years of education: Not on file  . Highest education level: Not on file  Occupational History  . Not on file  Tobacco Use  . Smoking status: Current Every Day Smoker  . Smokeless tobacco: Never Used  Substance and Sexual Activity  . Alcohol use: Yes  . Drug use: No  . Sexual activity: Not on file  Other Topics Concern  . Not on file  Social History Narrative  . Not on file   Social Determinants of Health   Financial Resource Strain: Not on file  Food  Insecurity: Not on file  Transportation Needs: Not on file  Physical Activity: Not on file  Stress: Not on file  Social Connections: Not on file     Family History: The patient's ***family history is not on file.   Denies family history of sudden cardiac death including drowning, car accidents, or unexplained deaths in the family.*** No history of non-ischemic cardiomyopathies including hypertrophic cardiomyopathy, left ventricular non-compaction, or arrhythmogenic right ventricular cardiomyopathy.*** No history of bicuspid aortic valve or aortic aneurysm or dissection. History of coronary artery disease notable for ***. History of arrhythmia notable for ***.   ROS:   Please see the history of present illness.    *** All other systems reviewed and are negative.  EKGs/Labs/Other Studies Reviewed:    The following studies were reviewed today: ***  EKG:  EKG is *** ordered today.  The ekg ordered today demonstrates *** 08/08/20:  Sinus Tachycardia 112 with QRS widening an with right axis deviation  Recent Labs: 08/09/2020: Hemoglobin 15.7; Platelets 137 08/10/2020: B Natriuretic Peptide 113.8 09/10/2020: BUN 18; Creatinine, Ser 1.31; Potassium 3.9; Sodium 137  Recent Lipid Panel No results found for: CHOL, TRIG, HDL, CHOLHDL, VLDL, LDLCALC, LDLDIRECT  08/09/20 Echocardiogram 1. Left ventricular ejection fraction, by estimation, is 25 to 30%. The  left ventricle has severely decreased function. The left ventricle  demonstrates global hypokinesis. The left ventricular  internal cavity size  was moderately dilated. There is mild  concentric left ventricular hypertrophy. Left ventricular diastolic  parameters are consistent with Grade I diastolic dysfunction (impaired  relaxation).  2. Right ventricular systolic function is normal. The right ventricular  size is normal.  3. Left atrial size was mildly dilated.  4. The mitral valve is normal in structure. Trivial mitral valve   regurgitation.  5. The aortic valve is normal in structure. There is mild calcification  of the aortic valve. There is mild thickening of the aortic valve. Aortic  valve regurgitation is not visualized.  6. The inferior vena cava is normal in size with greater than 50%  respiratory variability, suggesting right atrial pressure of 3 mmHg.  08/08/20 CTPE Significant Aortic Atherosclerosis without large coronary calcifications or pulmonary embolism  Risk Assessment/Calculations:   {Does this patient have ATRIAL FIBRILLATION?:[phone]}   Physical Exam:    VS:  There were no vitals taken for this visit.    Wt Readings from Last 3 Encounters:  08/17/20 192 lb (87.1 kg)  08/10/20 195 lb 1.6 oz (88.5 kg)     GEN: *** Well nourished, well developed in no acute distress HEENT: Normal NECK: No JVD; No carotid bruits LYMPHATICS: No lymphadenopathy CARDIAC: ***RRR, no murmurs, rubs, gallops RESPIRATORY:  Clear to auscultation without rales, wheezing or rhonchi  ABDOMEN: Soft, non-tender, non-distended MUSCULOSKELETAL:  No edema; No deformity  SKIN: Warm and dry NEUROLOGIC:  Alert and oriented x 3 PSYCHIATRIC:  Normal affect   ASSESSMENT:    No diagnosis found. PLAN:    In order of problems listed above:   Heart Failure *** Ejection Fraction  Dyspneea for BNP*** - NYHA class ***, Stage ***, ***volemic, etiology from *** - Diuretic regimen: Lasix/torsemide/Bumex +/- metolazone *** - Strict I/Os, daily weights, and fluid restriction of < 2 L  - Replace electrolytes PRN and keep K>4 and Mg>2. - *** BMP/BNP/Mg  - Coreg/bisoprolol/metoprolol***  - ARNI/ARB/ACEi*** - aldactone*** - isordil/hydralazine for afterload reduction***  - TTE ordered *** - SGLT2i*** - Device Indications: *** - Clinical Trials: *** - Indication for AHF: ***  HLD and Aortic Atherosclerosis Hyperlipidemia -LDL goal less than ***70/100 - Fasting triglycerides notable for -continue current  statin - Zetia vs Repatha*** - Vascepa*** - low threshold to send to lipid clinic for additional patient support *** - would stop niacin as it can raise blood sugars and increase uric acid*** - would stop OTC fish oil has little cardiovascular protection, is not regulated by FDA as it is actually a "supplement" and can continue incorrect amounts of DHA/EPA or harmful fats*** -Recheck lipid profile LFTs - gave education on dietary changes  Tobacco Abuse - discussed the dangers of tobacco use, both inhaled and oral, which include, but are not limited to cardiovascular disease, increased cancer risk of multiple types of cancer, COPD, peripheral arterial disease, strokes. - counseled on the benefits of smoking cessation. - firmly advised to quit.  - we also reviewed strategies to maximize success, including:  Removing cigarettes and smoking materials from environment   Stress management  Substitution of other forms of reinforcement (the one cigarette a day approach)  Support of family/friends and group smoking cessation  Selecting a quit date  Patient provided contact information for [employer] or 1-800-QUIT-NOW  Patient provided with [employer] free smoking cessation classes: [phone] and [email or web address]  -*** Patient has no disordered sleep, insomnia, abnormal dreams, no suicidal ideation on no antidepressant medications, ***CrCL gerater than 30 and would  like to try varenicline.  Discussed risks, including nausea and sleep issues, and benefits and will trial a 12 week course: 0.5 mg once daily for three days, then 0.5 mg twice daily for four days, and then 1 mg twice daily for the remainder of a 12-week course. -*** Patient has had no history of seizure, no suicidal ideation and no antidepressant medications,and would like to try bupropion.  Discussed risk, including seizure threshold/insomnia/dry mouth/headache, and benefits and will trial a 12 week course:  150  mg/day for three days, then 150 mg twice daily.   {Are you ordering a CV Procedure (e.g. stress test, cath, DCCV, TEE, etc)?   Press F2        :[id number]}    Medication Adjustments/Labs and Tests Ordered: Current medicines are reviewed at length with the patient today.  Concerns regarding medicines are outlined above.  No orders of the defined types were placed in this encounter.  No orders of the defined types were placed in this encounter.   There are no Patient Instructions on file for this visit.   Signed, Christell Constant, MD  09/17/2020 8:07 AM    [facility]

## 2020-09-22 ENCOUNTER — Inpatient Hospital Stay: Payer: Self-pay | Admitting: Physician Assistant

## 2020-09-30 ENCOUNTER — Inpatient Hospital Stay: Payer: Self-pay | Admitting: Internal Medicine

## 2020-09-30 ENCOUNTER — Inpatient Hospital Stay: Payer: Self-pay | Admitting: Family Medicine

## 2020-10-12 ENCOUNTER — Encounter: Payer: Self-pay | Admitting: Internal Medicine

## 2020-10-12 ENCOUNTER — Ambulatory Visit (INDEPENDENT_AMBULATORY_CARE_PROVIDER_SITE_OTHER): Payer: Self-pay | Admitting: Internal Medicine

## 2020-10-12 ENCOUNTER — Other Ambulatory Visit: Payer: Self-pay

## 2020-10-12 VITALS — BP 138/68 | HR 83 | Ht 68.0 in | Wt 212.0 lb

## 2020-10-12 DIAGNOSIS — Z720 Tobacco use: Secondary | ICD-10-CM

## 2020-10-12 DIAGNOSIS — E782 Mixed hyperlipidemia: Secondary | ICD-10-CM

## 2020-10-12 DIAGNOSIS — I700 Atherosclerosis of aorta: Secondary | ICD-10-CM

## 2020-10-12 DIAGNOSIS — I2584 Coronary atherosclerosis due to calcified coronary lesion: Secondary | ICD-10-CM

## 2020-10-12 DIAGNOSIS — I5023 Acute on chronic systolic (congestive) heart failure: Secondary | ICD-10-CM | POA: Insufficient documentation

## 2020-10-12 DIAGNOSIS — I2510 Atherosclerotic heart disease of native coronary artery without angina pectoris: Secondary | ICD-10-CM | POA: Insufficient documentation

## 2020-10-12 HISTORY — DX: Mixed hyperlipidemia: E78.2

## 2020-10-12 HISTORY — DX: Coronary atherosclerosis due to calcified coronary lesion: I25.84

## 2020-10-12 HISTORY — DX: Atherosclerotic heart disease of native coronary artery without angina pectoris: I25.10

## 2020-10-12 HISTORY — DX: Atherosclerosis of aorta: I70.0

## 2020-10-12 MED ORDER — ATORVASTATIN CALCIUM 40 MG PO TABS
40.0000 mg | ORAL_TABLET | Freq: Every day | ORAL | 3 refills | Status: DC
Start: 2020-10-12 — End: 2020-12-02

## 2020-10-12 MED ORDER — FUROSEMIDE 80 MG PO TABS
80.0000 mg | ORAL_TABLET | Freq: Two times a day (BID) | ORAL | 3 refills | Status: DC
Start: 2020-10-12 — End: 2020-11-18

## 2020-10-12 MED ORDER — LOSARTAN POTASSIUM 50 MG PO TABS: 50.0000 mg | ORAL_TABLET | Freq: Every day | ORAL | 3 refills | Status: DC

## 2020-10-12 MED ORDER — POTASSIUM CHLORIDE ER 10 MEQ PO TBCR
10.0000 meq | EXTENDED_RELEASE_TABLET | Freq: Two times a day (BID) | ORAL | 3 refills | Status: DC
Start: 2020-10-12 — End: 2020-11-24

## 2020-10-12 MED ORDER — CARVEDILOL 25 MG PO TABS
25.0000 mg | ORAL_TABLET | Freq: Two times a day (BID) | ORAL | 3 refills | Status: DC
Start: 2020-10-12 — End: 2020-10-22

## 2020-10-12 MED ORDER — SPIRONOLACTONE 25 MG PO TABS: 12.5000 mg | ORAL_TABLET | Freq: Once | ORAL | 3 refills | Status: DC

## 2020-10-12 NOTE — Patient Instructions (Signed)
Medication Instructions:  Your physician has recommended you make the following change in your medication:   1-Increase Atorvastatin 40 mg by mouth daily 2-Increase Furosemide 80 mg by mouth twice daily 3-START aldactone 12.5 mg by mouth daily  *If you need a refill on your cardiac medications before your next appointment, please call your pharmacy*  Lab Work: Your physician recommends that you return for lab work in: 10 days for BMET and Mg.  If you have labs (blood work) drawn today and your tests are completely normal, you will receive your results only by: Marland Kitchen MyChart Message (if you have MyChart) OR . A paper copy in the mail If you have any lab test that is abnormal or we need to change your treatment, we will call you to review the results.  Follow-Up: At [facility], you and your health needs are our priority.  As part of our continuing mission to provide you with exceptional heart care, we have created designated Provider Care Teams.  These Care Teams include your primary Cardiologist (physician) and Advanced Practice Providers (APPs -  Physician Assistants and Nurse Practitioners) who all work together to provide you with the care you need, when you need it.  We recommend signing up for the patient portal called "MyChart".  Sign up information is provided on this After Visit Summary.  MyChart is used to connect with patients for Virtual Visits (Telemedicine).  Patients are able to view lab/test results, encounter notes, upcoming appointments, etc.  Non-urgent messages can be sent to your provider as well.   To learn more about what you can do with MyChart, go to [email or web address].    Your next appointment:   2 to 3  month(s)  The format for your next appointment:   In Person  Provider:   You may see Christell Constant, MD or one of the following Advanced Practice Providers on your designated Care Team:    Ronie Spies, PA-C  Jacolyn Reedy, PA-C   You have been  referred to see our pharmacist in 10 days.

## 2020-10-12 NOTE — Progress Notes (Signed)
Cardiology Office Note:    Date:  10/12/2020   ID:  [name], DOB 01-04-1961, MRN [id number]  PCP:  Patient, [name]  [facility] Cardiologist:  Christell Constant, MD  [facility] Electrophysiologist:  None   CC: Follow up   History of Present Illness:    Cody Carney is a 60 y.o. male with a hx of HTN, Aortic Atherosclerosis with Coronary Artery Calcifications, COPD and Tobacco Abuse with lymph node seen on CT who presented 08/2020 with decompensated HF; here for evaluation.  Patient notes that he is having no energy.  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  Walking up the house to the drive way has shortness of breath and DOE.  No Since d/last visit notes running out of several medications changes.    There are no interval hospital/ED visit.  Patient has applied for insurance, has been approved, and is awaiting his care in the mail  Notes two episodes of chest pain with DOE that improves with increasing his lasix notes PND/Orthopnea.  Notes 12-13 lbs weight gain.  Notes bendopnea and abdominal swelling.  No palpitations or syncope .  Ambulatory blood pressure not done.   Past Medical History:  Diagnosis Date  . CHF (congestive heart failure) (HCC)   . COPD (chronic obstructive pulmonary disease) (HCC)   . Hypertension     Past Surgical History:  Procedure Laterality Date  . LEG SURGERY      Current Medications: Current Meds  Medication Sig  . aspirin 81 MG EC tablet Take 81 mg by mouth daily.  Marland Kitchen doxycycline (VIBRA-TABS) 100 MG tablet Take 1 tablet (100 mg total) by mouth every 12 (twelve) hours.  . nicotine (NICODERM CQ - DOSED IN MG/24 HOURS) 21 mg/24hr patch Place 1 patch (21 mg total) onto the skin daily.  . potassium chloride (KLOR-CON) 10 MEQ tablet Take 10 mEq by mouth 2 (two) times daily.  Marland Kitchen spironolactone (ALDACTONE) 25 MG tablet Take 0.5 tablets (12.5 mg total) by mouth once for 1 dose.  . Study - DAPA TIMI 68 - dapagliflozin (FARXIGA) 10 mg  or placebo tablet (PI-Dalton McLean) Take 1 tablet by mouth daily.  . [DISCONTINUED] atorvastatin (LIPITOR) 20 MG tablet Take 20 mg by mouth daily.  . [DISCONTINUED] carvedilol (COREG) 25 MG tablet Take 25 mg by mouth in the morning and at bedtime.  . [DISCONTINUED] furosemide (LASIX) 40 MG tablet Take 1 tablet (40 mg total) by mouth 2 (two) times daily.  . [DISCONTINUED] losartan (COZAAR) 50 MG tablet Take 1 tablet (50 mg total) by mouth daily.  . [DISCONTINUED] potassium chloride (KLOR-CON) 10 MEQ tablet Take 1 tablet (10 mEq total) by mouth 2 (two) times daily.     Allergies:   Patient has no known allergies.   Social History   Socioeconomic History  . Marital status: Single    Spouse name: Not on file  . Number of children: Not on file  . Years of education: Not on file  . Highest education level: Not on file  Occupational History  . Not on file  Tobacco Use  . Smoking status: Current Every Day Smoker  . Smokeless tobacco: Never Used  Substance and Sexual Activity  . Alcohol use: Yes  . Drug use: No  . Sexual activity: Not on file  Other Topics Concern  . Not on file  Social History Narrative  . Not on file   Social Determinants of Health   Financial Resource Strain: Not  on file  Food Insecurity: Not on file  Transportation Needs: Not on file  Physical Activity: Not on file  Stress: Not on file  Social Connections: Not on file     Family History: History of coronary artery disease notable for uncles have had MI. History of heart failure notable for no members. History of arrhythmia notable for uncle needing pacemaker. No history of bicuspid aortic valve or aortic aneurysm or dissection.  ROS:   Please see the history of present illness.    Bilateral knee and back pain All other systems reviewed and are negative.  EKGs/Labs/Other Studies Reviewed:    The following studies were reviewed today:  EKG:  08/09/20:  Sinus tachycardia rate 113 bpm  Transthoracic  Echocardiogram: Date:08/09/20 Results: IMPRESSIONS  1. Left ventricular ejection fraction, by estimation, is 25 to 30%. The  left ventricle has severely decreased function. The left ventricle  demonstrates global hypokinesis. The left ventricular internal cavity size  was moderately dilated. There is mild  concentric left ventricular hypertrophy. Left ventricular diastolic  parameters are consistent with Grade I diastolic dysfunction (impaired  relaxation).  2. Right ventricular systolic function is normal. The right ventricular  size is normal.  3. Left atrial size was mildly dilated.  4. The mitral valve is normal in structure. Trivial mitral valve  regurgitation.  5. The aortic valve is normal in structure. There is mild calcification  of the aortic valve. There is mild thickening of the aortic valve. Aortic  valve regurgitation is not visualized.  6. The inferior vena cava is normal in size with greater than 50%  respiratory variability, suggesting right atrial pressure of 3 mmHg.   Comparison(s): No prior Echocardiogram.   NonCardiac CT: Date: 08/08/2020 Results: Stable mediastinal lymph node; aortic atherosclerosis, CAC  Left/Right Heart Catheterizations: ECU Will request records today   Recent Labs: 08/09/2020: Hemoglobin 15.7; Platelets 137 08/10/2020: B Natriuretic Peptide 113.8 09/10/2020: BUN 18; Creatinine, Ser 1.31; Potassium 3.9; Sodium 137  Recent Lipid Panel No results found for: CHOL, TRIG, HDL, CHOLHDL, VLDL, LDLCALC, LDLDIRECT   Risk Assessment/Calculations:     N/A  Physical Exam:    VS:  BP 138/68   Pulse 83   Ht 5\' 8"  (1.727 m)   Wt 212 lb (96.2 kg)   SpO2 98%   BMI 32.23 kg/m     Wt Readings from Last 3 Encounters:  10/12/20 212 lb (96.2 kg)  08/17/20 192 lb (87.1 kg)  08/10/20 195 lb 1.6 oz (88.5 kg)    GEN: Well nourished, well developed in no acute distress HEENT: Normal NECK: No JVD; No carotid bruits LYMPHATICS: No  lymphadenopathy CARDIAC: RRR, no murmurs, rubs, gallops RESPIRATORY:  Clear to auscultation without rales, wheezing or rhonchi  ABDOMEN: Soft, non-tender, non-distended MUSCULOSKELETAL:  Notable abdominal wall edema; No deformity  SKIN: Warm and dry NEUROLOGIC:  Alert and oriented x 3 PSYCHIATRIC:  Normal affect   ASSESSMENT:    1. Acute on chronic systolic heart failure (HCC)   2. Aortic atherosclerosis (HCC)   3. Tobacco abuse   4. Coronary artery calcification   5. Mixed hyperlipidemia    PLAN:    In order of problems listed above:   Heart Failure reduced Ejection Fraction  - NYHA class III, Stage C, hypervolemic, etiology from unknown cause, but getting prior LHC - concern for the need for repeat hospitalization; discussed fluid restriction and indications for hospitalization - Diuretic regimen: will increase lasix to 80 mg PO BID - Strict I/Os, daily  weights, and fluid restriction of < 2 L  - BMP/BNP/Mg in 7-10 days - Coreg 25 mg PO BID (refill) - Continue losartan (refill) will transition to [employer] when insurance comes through - start aldactone 12.5 mg PO Daily; may need to adjust potassium, given increasing lasix dose and starting MRA (goal is to drop K supplements and just increase aldactone) - Device Indications: none; not on GDMT yet - Clinical Trials: on a SGLTi inhibitor vs placebo trial - Indication for AHF: long term could be appropriate  Aortic Atherosclerosis Coronary calcification HLD - increase atorvastatin to 40 mg PO daily and recheck lab sin 7-10 days  Tobacco Abuse down to 2 packs a week - discussed the dangers of tobacco use, both inhaled and oral, which include, but are not limited to cardiovascular disease, increased cancer risk of multiple types of cancer, COPD, peripheral arterial disease, strokes. - counseled on the benefits of smoking cessation. - firmly advised to quit.  - we also reviewed strategies to maximize success,  including:  Removing cigarettes and smoking materials from environment   Stress management  Substitution of other forms of reinforcement (the one cigarette a day approach)  Support of family/friends and group smoking cessation  Selecting a quit date  Patient provided contact information for [employer] or 1-800-QUIT-NOW  Patient provided with [employer] 8 free smoking cessation classes: [phone] and [employer]fr    Will plan for pharmacy follow up in 10 days for medication titration assistance, will see in 2-3 months follow up unless new symptoms or abnormal test results warranting change in plan    Would be reasonable for  APP Follow up  Time Spent Directly with Patient:   I have spent a total of 60 with the patient reviewing hospital notes,  EKGs, labs and examining the patient as well as establishing an assessment and plan that was discussed personally with the patient.  > 50% of time was spent in direct patient care, discussing plans dealing with his socio-economic status, and planning to avoid possible hospitalization.   Medication Adjustments/Labs and Tests Ordered: Current medicines are reviewed at length with the patient today.  Concerns regarding medicines are outlined above.  Orders Placed This Encounter  Procedures  . Basic metabolic panel  . Magnesium  . AMB Referral to [facility] Pharm-D   Meds ordered this encounter  Medications  . carvedilol (COREG) 25 MG tablet    Sig: Take 1 tablet (25 mg total) by mouth in the morning and at bedtime.    Dispense:  180 tablet    Refill:  3  . atorvastatin (LIPITOR) 40 MG tablet    Sig: Take 1 tablet (40 mg total) by mouth daily.    Dispense:  90 tablet    Refill:  3  . furosemide (LASIX) 80 MG tablet    Sig: Take 1 tablet (80 mg total) by mouth 2 (two) times daily.    Dispense:  180 tablet    Refill:  3  . losartan (COZAAR) 50 MG tablet    Sig: Take 1 tablet (50 mg total) by mouth daily.     Dispense:  90 tablet    Refill:  3  . potassium chloride (KLOR-CON) 10 MEQ tablet    Sig: Take 1 tablet (10 mEq total) by mouth 2 (two) times daily.    Dispense:  180 tablet    Refill:  3  . spironolactone (ALDACTONE) 25 MG tablet    Sig: Take 0.5 tablets (12.5 mg total) by mouth  once for 1 dose.    Dispense:  45 tablet    Refill:  3    Patient Instructions  Medication Instructions:  Your physician has recommended you make the following change in your medication:   1-Increase Atorvastatin 40 mg by mouth daily 2-Increase Furosemide 80 mg by mouth twice daily 3-START aldactone 12.5 mg by mouth daily  *If you need a refill on your cardiac medications before your next appointment, please call your pharmacy*  Lab Work: Your physician recommends that you return for lab work in: 10 days for BMET and Mg.  If you have labs (blood work) drawn today and your tests are completely normal, you will receive your results only by: Marland Kitchen MyChart Message (if you have MyChart) OR . A paper copy in the mail If you have any lab test that is abnormal or we need to change your treatment, we will call you to review the results.  Follow-Up: At [facility], you and your health needs are our priority.  As part of our continuing mission to provide you with exceptional heart care, we have created designated Provider Care Teams.  These Care Teams include your primary Cardiologist (physician) and Advanced Practice Providers (APPs -  Physician Assistants and Nurse Practitioners) who all work together to provide you with the care you need, when you need it.  We recommend signing up for the patient portal called "MyChart".  Sign up information is provided on this After Visit Summary.  MyChart is used to connect with patients for Virtual Visits (Telemedicine).  Patients are able to view lab/test results, encounter notes, upcoming appointments, etc.  Non-urgent messages can be sent to your provider as well.   To learn  more about what you can do with MyChart, go to [email or web address].    Your next appointment:   2 to 3  month(s)  The format for your next appointment:   In Person  Provider:   You may see Christell Constant, MD or one of the following Advanced Practice Providers on your designated Care Team:    Ronie Spies, PA-C  Jacolyn Reedy, PA-C   You have been referred to see our pharmacist in 10 days.      Signed, Christell Constant, MD  10/12/2020 4:11 PM    [facility]

## 2020-10-14 ENCOUNTER — Encounter: Payer: Self-pay | Admitting: *Deleted

## 2020-10-14 ENCOUNTER — Other Ambulatory Visit: Payer: Self-pay

## 2020-10-14 DIAGNOSIS — Z006 Encounter for examination for normal comparison and control in clinical research program: Secondary | ICD-10-CM

## 2020-10-14 NOTE — Research (Addendum)
    Month 2 Visit Worksheet  PATIENT ID: [id number]   DATE OF CONTACT:  January 6th, 2022   Type of Visit:  (Visit should be done in person whenever possible)   [x]  Clinic Visit                         []  Telephone Contact with Patient     []  Family Member or Caregiver  []  Primary Care Physician  []  Medical Record Review   []  Other (specify)____________  1. Did patient experience any AEs leading to study drug discontinuation, SAEs related to study drug, protocol-defined unexpected/unanticipated events, clinical endpoints (death, worsening HF), or adverse events of special interest since the last visit?  []     Yes*  [x]      No *If yes, please record in [employer] EDC (eCRF)  2. HF Signs and Symptoms Assessed by: , RN  3. Vital Signs  Pulse:  92 bpm   Height: 68 []  cm [x]  in (xxx.xx)  B/P: 131/66 mmHg   Body Weight: 206.4 []  kg [x]  lbs  (xxx.xx)  4. Concomitant medications Enter current heart failure and diabetes medications in the eCRF  5. Is Patient currently taking study drug?  [x]  Yes    or   []  No*     *If no, describe reason:    6. Study Drug Compliance  Date last tablet taken:  January 6th, 2022  Did patient bring bottles with them to visit? [x]  Yes []  No  Bottle number 1:    Opened?  Yes  Number of tablets remaining 0 Bottle number 2:  51012   Opened?  Yes  Number of tablets remaining 5  How many days did patient NOT take the study drug since last visit?   0 Days  Investigator judgment of Compliance: [x]  Reasonable (? 20% deviation from expected)        []  Questionable (>20% deviation from expected)  7. Local Labs Performed Creatinine: 1.00 mg/dl Date: January 6th, 2022 Potassium : 4.0 mmol/L  Date: January 6th, 2022   8. Was KCCQ questionnaire completed?  [x]  Yes  []  No   9. Other Items  [x]  Ensure the Adverse Events Question is answered in eCRF  [x]  Complete End of Study Visit Pages in eCRF, including date of last dose of study  drug    DAPA ACT HF-TIMI 68 Month 2 Visit Worksheet      Version 2.0 Date: 13 May 2020

## 2020-10-15 LAB — BASIC METABOLIC PANEL
BUN/Creatinine Ratio: 18 (ref 9–20)
BUN: 18 mg/dL (ref 6–24)
CO2: 23 mmol/L (ref 20–29)
Calcium: 9.7 mg/dL (ref 8.7–10.2)
Chloride: 100 mmol/L (ref 96–106)
Creatinine, Ser: 1 mg/dL (ref 0.76–1.27)
GFR calc Af Amer: 96 mL/min/{1.73_m2} (ref >59)
GFR calc non Af Amer: 83 mL/min/{1.73_m2} (ref >59)
Glucose: 98 mg/dL (ref 65–99)
Potassium: 4 mmol/L (ref 3.5–5.2)
Sodium: 138 mmol/L (ref 134–144)

## 2020-10-22 ENCOUNTER — Ambulatory Visit (INDEPENDENT_AMBULATORY_CARE_PROVIDER_SITE_OTHER): Payer: Self-pay | Admitting: Pharmacist

## 2020-10-22 ENCOUNTER — Other Ambulatory Visit: Payer: Self-pay | Admitting: *Deleted

## 2020-10-22 ENCOUNTER — Telehealth: Payer: Self-pay | Admitting: Licensed Clinical Social Worker

## 2020-10-22 ENCOUNTER — Other Ambulatory Visit: Payer: Self-pay

## 2020-10-22 VITALS — BP 122/76 | HR 106 | Wt 206.0 lb

## 2020-10-22 DIAGNOSIS — I5023 Acute on chronic systolic (congestive) heart failure: Secondary | ICD-10-CM

## 2020-10-22 DIAGNOSIS — I5043 Acute on chronic combined systolic (congestive) and diastolic (congestive) heart failure: Secondary | ICD-10-CM

## 2020-10-22 NOTE — Telephone Encounter (Signed)
CSW team received a referral for pt, he has not filled his medications and has run out due to insurance issues. Per chart pt does not have coverage, in referral note from [place], PharmD, pt has purchased insurance through [employer] but has yet to receive his cards at this time. Pharmacist inquring if we can assist pt with insurance issues. CSW called both listed numbers twice. Only one number is in service [phone]. No answer and unable to leave voicemail on the machine at this time.   Octavio Graves, MSW, LCSW [facility] Health Heart/Vascular Care Navigation  [phone]

## 2020-10-22 NOTE — Progress Notes (Signed)
Patient ID: Cody Carney                 DOB: [birth year]                      MRN: [id number]     HPI: Cody Carney is a 60 y.o. male referred by Dr. Izora Ribas to pharmacy clinic for HF medication management. PMH is significant for HTN, CAD, tobacco abuse. Most recent LVEF 25-30% on 08/09/20. He is enrolled in the DAPA TIMI 68 trial, but done with the study drug. Was hypervolemic at last appointment with Dr. Izora Ribas. Just got insurance.   Today he presents to pharmacy clinic for further medication titration. At last visit with MD on 10/12/20 furosemide was increased to 80mg  BID and spironolactone 12.5mg  daily was started.  Patient states he has been out of all his medications for about 8 days now. He is very fatigues, short of breath with exertion, swollen in his abdomen and hands. Weight is actually down from last visit, but clearly still volume overloaded. He is out of his medications because [facility] told him it would be ~100 for all his meds and he cannot afford that. Still has not gotten his insurance cards in the mail. Bought a plan from .   Current CHF meds: losartan 50 mg daily, spironolactone 12.5mg  daily, carvedilol 25mg  twice a day, furosemide 80mg  twice a day, potassium 10 MEQ twice a day Previously tried:  BP goal: <130/80  Family History: History of coronary artery disease notable for uncles have had MI. History of heart failure notable for no members. History of arrhythmia notable for uncle needing pacemaker. No history of bicuspid aortic valve or aortic aneurysm or dissection  Social History: everyday smoker, + ETOH  Diet: 3 eggs and toast,  Lunch-no lunch Dinner- chicken, fish (baked) broccoli, cauliflower, squash, egg plant, cooks fresh dried beans  Roommate cooks for him  Exercise: none due to SOB  Home BP readings: 138/60  Wt Readings from Last 3 Encounters:  10/12/20 212 lb (96.2 kg)  08/17/20 192 lb (87.1 kg)  08/10/20 195 lb 1.6 oz (88.5 kg)    BP Readings from Last 3 Encounters:  10/12/20 138/68  08/17/20 110/65  08/10/20 (!) 91/59   Pulse Readings from Last 3 Encounters:  10/12/20 83  08/17/20 94  08/10/20 77    Renal function: Estimated Creatinine Clearance: 91.6 mL/min (by C-G formula based on SCr of 1 mg/dL).  Past Medical History:  Diagnosis Date  . CHF (congestive heart failure) (HCC)   . COPD (chronic obstructive pulmonary disease) (HCC)   . Hypertension     Current Outpatient Medications on File Prior to Visit  Medication Sig Dispense Refill  . aspirin 81 MG EC tablet Take 81 mg by mouth daily.    12/10/20 atorvastatin (LIPITOR) 40 MG tablet Take 1 tablet (40 mg total) by mouth daily. 90 tablet 3  . carvedilol (COREG) 25 MG tablet Take 1 tablet (25 mg total) by mouth in the morning and at bedtime. 180 tablet 3  . doxycycline (VIBRA-TABS) 100 MG tablet Take 1 tablet (100 mg total) by mouth every 12 (twelve) hours. 7 tablet 0  . furosemide (LASIX) 80 MG tablet Take 1 tablet (80 mg total) by mouth 2 (two) times daily. 180 tablet 3  . losartan (COZAAR) 50 MG tablet Take 1 tablet (50 mg total) by mouth daily. 90 tablet 3  . nicotine (NICODERM CQ - DOSED IN MG/24 HOURS) 21 mg/24hr patch  Place 1 patch (21 mg total) onto the skin daily. 28 patch 0  . potassium chloride (KLOR-CON) 10 MEQ tablet Take 10 mEq by mouth 2 (two) times daily.    . potassium chloride (KLOR-CON) 10 MEQ tablet Take 1 tablet (10 mEq total) by mouth 2 (two) times daily. 180 tablet 3  . spironolactone (ALDACTONE) 25 MG tablet Take 0.5 tablets (12.5 mg total) by mouth once for 1 dose. 45 tablet 3  . Study - DAPA TIMI 68 - dapagliflozin (FARXIGA) 10 mg or placebo tablet (PI-Dalton McLean) Take 1 tablet by mouth daily.     No current facility-administered medications on file prior to visit.    No Known Allergies   Assessment/Plan:  1. CHF - Patient is clearly fluid overloaded due to being out of his medications. BP is actually on the low side for  not being on medications. Although he was slightly tachycardic. I was able to give him a gift card from the patient care fund for $25 to pick up his essential medications. Furosemide, losartan and carvedilol should be $21. I have advised him to take furosemide right away, along with 1/2 (25mg ) of his losartan. Hold off on carvedilol for a few days due to its ability to worsen fluid status. Restart at 1/2 tab (12.5mg ) BID.  Take spironolactone if he can afford it, but its not on $4 list. He is to go to the ER with worsening symptoms. He is to call me with any questions. I have given his name to social work to help with insurance issue. Follow up in clinic in 10 days.   Thank you,  , Pharm.D, BCPS, CPP [facility]  [address] [place], [place]  Phone: [phone]; Fax: [phone]

## 2020-10-22 NOTE — Telephone Encounter (Signed)
CSW attempted one more time to reach pt this afternoon. Was able to touch base with him at [phone]. Introduced self, role, reason for call. Pt says he is feeling tired but otherwise okay after his appointment today.   He confirms his home address, emergency contact and that his cell phone is currently out of service.   He explained that he purchased coverage through the [facility] Marketplace about 3 weeks ago and still hasn't recieved his cards yet. He is not sure who his coverage is through at this time. He is willing to let this writer try and help him with identifying his coverage if possible. I told him I would look into what I could find out and give him another call. Pt in agreement.   Cody Carney, MSW, LCSW [facility] Health Heart/Vascular Care Navigation  [phone]

## 2020-10-22 NOTE — Patient Instructions (Addendum)
[facility] with your [facility] card. The most important are your furosemide, carvedilol and losartan. Resume furosemide and losartan as soon as you pick them up. Take 1/2 tablet of losartan. Start these right away  Resume carvedilol at 1/2 tablet in a few days when your swelling is improved.  Contact insurance about your cards and try to get information to give to pharmacy ID #, RX BIN, RX PCN  Call me at [phone] with any questions

## 2020-10-23 LAB — BASIC METABOLIC PANEL
BUN/Creatinine Ratio: 19 (ref 9–20)
BUN: 22 mg/dL (ref 6–24)
CO2: 20 mmol/L (ref 20–29)
Calcium: 9.7 mg/dL (ref 8.7–10.2)
Chloride: 101 mmol/L (ref 96–106)
Creatinine, Ser: 1.13 mg/dL (ref 0.76–1.27)
GFR calc Af Amer: 83 mL/min/{1.73_m2} (ref >59)
GFR calc non Af Amer: 72 mL/min/{1.73_m2} (ref >59)
Glucose: 111 mg/dL — ABNORMAL HIGH (ref 65–99)
Potassium: 3.9 mmol/L (ref 3.5–5.2)
Sodium: 138 mmol/L (ref 134–144)

## 2020-10-23 LAB — MAGNESIUM: Magnesium: 1.8 mg/dL (ref 1.6–2.3)

## 2020-10-25 ENCOUNTER — Telehealth: Payer: Self-pay | Admitting: Licensed Clinical Social Worker

## 2020-10-25 NOTE — Telephone Encounter (Signed)
I gave patient this information (including phone number) when I reviewed lab results with him today

## 2020-10-25 NOTE — Telephone Encounter (Signed)
CSW was able to reach the Healthcare.Medical laboratory scientific officer at [phone]. Unfortunately as I am not an approved contact on his account they were unable to provide me with pt coverage information but they shared that if pt was to call the number above they would be able to provide him with coverage and contact information for further care needs. CSW will reach out to pt to share this information.   Octavio Graves, MSW, LCSW [facility] Health Heart/Vascular Care Navigation  [phone]

## 2020-10-27 ENCOUNTER — Telehealth: Payer: Self-pay | Admitting: Licensed Clinical Social Worker

## 2020-10-27 NOTE — Telephone Encounter (Signed)
CSW attempted to reach pt this morning [phone]) to review if he had been able to reach Marketplace to obtain coverage information- CSW will be able to assist pt with this should he like me to. At this time no answer, no voicemail set up to leave message. Will re-attempt at a later time today as able.   Octavio Graves, MSW, LCSW [facility] Health Heart/Vascular Care Navigation  [phone]

## 2020-10-29 ENCOUNTER — Telehealth: Payer: Self-pay | Admitting: Licensed Clinical Social Worker

## 2020-10-29 NOTE — Telephone Encounter (Signed)
CSW called x2 to f/u on Healthcare.gov inquiry regarding coverage made last week. No answer, phone busy. Will re-attempt as able.   Octavio Graves, MSW, LCSW [facility] Health Heart/Vascular Care Navigation  [phone]

## 2020-11-01 ENCOUNTER — Ambulatory Visit: Payer: Self-pay

## 2020-11-01 ENCOUNTER — Telehealth: Payer: Self-pay | Admitting: Licensed Clinical Social Worker

## 2020-11-01 NOTE — Telephone Encounter (Signed)
CSW was able to reach pt on 1/21 on last attemp to confirm that he has been able find his coverage information. CSW encouraged him to bring his card today to his appointment with pharmacy staff. When I reviewed chart this morning pt coverage has been uploaded.  Remain available if any future needs arise.   Octavio Graves, MSW, LCSW [facility] Health Heart/Vascular Care Navigation  [phone]

## 2020-11-08 ENCOUNTER — Ambulatory Visit: Payer: Self-pay

## 2020-11-18 ENCOUNTER — Ambulatory Visit (INDEPENDENT_AMBULATORY_CARE_PROVIDER_SITE_OTHER): Payer: PRIVATE HEALTH INSURANCE | Admitting: Pharmacist

## 2020-11-18 ENCOUNTER — Other Ambulatory Visit: Payer: Self-pay

## 2020-11-18 VITALS — BP 142/82 | HR 96

## 2020-11-18 DIAGNOSIS — Z720 Tobacco use: Secondary | ICD-10-CM | POA: Diagnosis not present

## 2020-11-18 DIAGNOSIS — E782 Mixed hyperlipidemia: Secondary | ICD-10-CM

## 2020-11-18 DIAGNOSIS — I5043 Acute on chronic combined systolic (congestive) and diastolic (congestive) heart failure: Secondary | ICD-10-CM | POA: Diagnosis not present

## 2020-11-18 DIAGNOSIS — J439 Emphysema, unspecified: Secondary | ICD-10-CM

## 2020-11-18 MED ORDER — SPIRONOLACTONE 25 MG PO TABS: 25.0000 mg | ORAL_TABLET | Freq: Every day | ORAL | 11 refills | Status: DC

## 2020-11-18 MED ORDER — FUROSEMIDE 80 MG PO TABS: 80.0000 mg | ORAL_TABLET | Freq: Three times a day (TID) | ORAL | 3 refills | Status: DC

## 2020-11-18 NOTE — Progress Notes (Signed)
Patient ID: Cody Carney                 DOB: 1961-08-27                      MRN: [id number]     HPI: Cody Carney is a 60 y.o. male referred by Dr. Izora Ribas to pharmacy clinic for HF medication management. PMH is significant for HTN, CAD, tobacco abuse. Most recent LVEF 25-30% on 08/09/20. He is enrolled in the DAPA TIMI 68 trial, but done with the study drug. Was hypervolemic at last appointment with Dr. Izora Ribas. Just got insurance.   Today he presents to pharmacy clinic for further medication titration. At last visit with MD on 10/22/20 patient reported being out of all his medications. He was fluid overloaded. He was given a [employer] gift card to pick up his furosemide, losartan and carvedilol. Cody Nail, LCSW has been assisting him getting his insurance information. Was supposed to follow up with me  On 1/24 but has rescheduled appointment a few times.   Patient presents today for follow up. His hands are swollen, abdomen is tight. He states he wakes up in the night sometimes feeling like he cant breath. He will take an extra furosemide to help. This happens a couple times a week. He did pick up his furosemide, carvedilol and losartan after last visit and has been taking. Has not gotten spironolactone. Sometimes he urinated a lot with furosemide and sometimes not that much. He is drinking a lot of water. States he thirsty all the time. States he feels tired all the time. He is down to 2 packs of cigarettes per week. HR in clinic was 96 and BP was 142/82  The only information on his insurance he could get was the following. Does not know if he has Rx coverage.   [employer] [place] [phone] ID: [id number]  He has not worked in 2 years. Does not have any income. Has a lawyer working on trying to get his disability. Lives off food stamps.   I asked Dr. Izora Ribas to come down and evaluate the patient as well due to concenrs over his fluid status.  Current CHF meds: losartan 50 mg daily,  carvedilol 25mg  twice a day, furosemide 80mg  twice a day, potassium 20 MEQ daily Previously tried:  BP goal: <130/80  Family History: History of coronary artery disease notable for uncles have had MI. History of heart failure notable for no members. History of arrhythmia notable for uncle needing pacemaker. No history of bicuspid aortic valve or aortic aneurysm or dissection  Social History: everyday smoker (down to 2 packs per week), + ETOH  Diet: 3 eggs and toast,  Lunch-no lunch Dinner- chicken, fish (baked) broccoli, cauliflower, squash, egg plant, cooks fresh dried beans  Roommate cooks for him  Exercise: none due to SOB  Home BP readings: none  Wt Readings from Last 3 Encounters:  10/22/20 206 lb (93.4 kg)  10/12/20 212 lb (96.2 kg)  08/17/20 192 lb (87.1 kg)   BP Readings from Last 3 Encounters:  10/22/20 122/76  10/12/20 138/68  08/17/20 110/65   Pulse Readings from Last 3 Encounters:  10/22/20 (!) 106  10/12/20 83  08/17/20 94    Renal function: CrCl cannot be calculated (Patient's most recent lab result is older than the maximum 21 days allowed.).  Past Medical History:  Diagnosis Date  . CHF (congestive heart failure) (HCC)   . COPD (chronic obstructive pulmonary disease) (HCC)   .  Hypertension     Current Outpatient Medications on File Prior to Visit  Medication Sig Dispense Refill  . aspirin 81 MG EC tablet Take 81 mg by mouth daily.    Marland Kitchen atorvastatin (LIPITOR) 40 MG tablet Take 1 tablet (40 mg total) by mouth daily. 90 tablet 3  . carvedilol (COREG) 25 MG tablet Take 0.5 tablets (12.5 mg total) by mouth in the morning and at bedtime. 180 tablet 3  . doxycycline (VIBRA-TABS) 100 MG tablet Take 1 tablet (100 mg total) by mouth every 12 (twelve) hours. 7 tablet 0  . furosemide (LASIX) 80 MG tablet Take 1 tablet (80 mg total) by mouth 2 (two) times daily. 180 tablet 3  . losartan (COZAAR) 50 MG tablet Take 0.5 tablets (25 mg total) by mouth daily. 90  tablet 3  . nicotine (NICODERM CQ - DOSED IN MG/24 HOURS) 21 mg/24hr patch Place 1 patch (21 mg total) onto the skin daily. 28 patch 0  . potassium chloride (KLOR-CON) 10 MEQ tablet Take 10 mEq by mouth 2 (two) times daily.    . potassium chloride (KLOR-CON) 10 MEQ tablet Take 1 tablet (10 mEq total) by mouth 2 (two) times daily. 180 tablet 3  . spironolactone (ALDACTONE) 25 MG tablet Take 0.5 tablets (12.5 mg total) by mouth once for 1 dose. 45 tablet 3  . Study - DAPA TIMI 68 - dapagliflozin (FARXIGA) 10 mg or placebo tablet (PI-Dalton McLean) Take 1 tablet by mouth daily.     No current facility-administered medications on file prior to visit.    No Known Allergies   Assessment/Plan:  1. CHF - Patient is clearly fluid overloaded. BP above goal of <130/80 but not horribly high. The more pressing issue currently is his fluid status. Dr. Izora Ribas came to evaluate the patient and we discussed options. Options included direct admission for IV diuresis, switching to torsemide or increasing furosemide to three times a day. Due to cost concerns and patients desire to stay out of the hospital, the decision was made to increase furosemide to 80mg  three times a day. Patient was advised that if his breathing got worse he was to go to the ER. He will get a BMP, BNP and A1C next Wednesday 2/16. He will also see Dr. 3/16 that day as well. If not better, he will be admitted. I have also asked him to start spironolactone 12.5mg  daily. He states he thinks he can afford to pick up. He still has some money on his [employer] giftcard. He filled out patient assistance paperwork for [employer] that we will submit.   Thank you,  Izora Ribas, Pharm.D, BCPS, CPP [facility]  [address], [place], [place]  Phone: [phone]; Fax: [phone]   Personally seen and examined. Agree with our pharmacist above with the following comments: Briefly 60 yo M with  social determinants of health concerns stemming for financial need, HFrEF EF 25%, Tobacco abuse and COPD, and HTN and HLD.  Since last evaluation has his medication, takes it, but is still having persistent SOB.   Patient notes that he has had increased fluid retention, and has orthopnea where sometimes has to sit up at night, take an extra lasix pill, and can't sleep until after he urinates   Exam notable for  Gen: ill appearing fatigued male BP 142/82, RR 20, Pulse 96 Neck:  JVD to mid neck without HJR Resp:  Good air movement with crackles in the bases Cards:  Normal S1, S2, no murmurs no pulsus alternans Skin +1 edema  Legs skin is warm GI:  Hyperactive BS,  abdominal distention with fluid wave. Neuro: AoX3 CN II-XII grossly intact Psych:  OK mood, depressed affect  Discussed admission with patient:  He notes that he cannot afford this and would not like to avoid this if at all possible. In shared decision making with the patient we increase lasix to 80 mg TID, spironolactone 12.5 mg PO daily; will get labs next week and see me again.  If no significant improvement will need to be admitted directly for IV diuresis.  If worsening SOB with will need admitted.

## 2020-11-18 NOTE — Patient Instructions (Addendum)
Start taking furosemide 80mg  three times a day. 6 AM, noon and 6PM  Starting taking spironolactone 12.5mg  daily  Continue taking losartan 50mg  daily, carvedilol 25mg  twice a day and potassium 20 MEQ daily  Come for lab work and to see Dr. on Friday 2/18  Call me at [phone] with any questions  Go to the ER if your breathing gets worse

## 2020-11-19 ENCOUNTER — Telehealth: Payer: Self-pay

## 2020-11-19 ENCOUNTER — Telehealth: Payer: Self-pay | Admitting: Pharmacist

## 2020-11-19 DIAGNOSIS — I5023 Acute on chronic systolic (congestive) heart failure: Secondary | ICD-10-CM

## 2020-11-19 DIAGNOSIS — I5043 Acute on chronic combined systolic (congestive) and diastolic (congestive) heart failure: Secondary | ICD-10-CM

## 2020-11-19 NOTE — Telephone Encounter (Signed)
Application for [employer] pt assistance faxed to [facility]

## 2020-11-19 NOTE — Telephone Encounter (Signed)
Referral to Heart Failure Clinic placed.  Patient requires additional resources to manage CHF.

## 2020-11-19 NOTE — Telephone Encounter (Signed)
Attempted to call pt to notify him of appointment on 11/24/20 with Dr. Izora Ribas at 9:20.  Does not have a voicemail will attempt to call again today.

## 2020-11-19 NOTE — Telephone Encounter (Signed)
Patient called me to ask a question about his research study. So I advised him on his apt Wed 2/16 @ 9:20. Patient states he will be there. States he feels a little better with the extra dose of furosemide.

## 2020-11-24 ENCOUNTER — Ambulatory Visit (INDEPENDENT_AMBULATORY_CARE_PROVIDER_SITE_OTHER): Payer: PRIVATE HEALTH INSURANCE | Admitting: Internal Medicine

## 2020-11-24 ENCOUNTER — Other Ambulatory Visit: Payer: PRIVATE HEALTH INSURANCE

## 2020-11-24 ENCOUNTER — Other Ambulatory Visit: Payer: Self-pay

## 2020-11-24 ENCOUNTER — Encounter (HOSPITAL_COMMUNITY): Payer: Self-pay | Admitting: Internal Medicine

## 2020-11-24 ENCOUNTER — Observation Stay (HOSPITAL_COMMUNITY)
Admission: AD | Admit: 2020-11-24 | Discharge: 2020-11-26 | Disposition: A | Discharge status: Home / Self Care | Payer: PRIVATE HEALTH INSURANCE | Source: Ambulatory Visit | Attending: Internal Medicine | Admitting: Internal Medicine

## 2020-11-24 ENCOUNTER — Encounter: Payer: Self-pay | Admitting: Internal Medicine

## 2020-11-24 VITALS — BP 124/82 | HR 90 | Ht 68.0 in | Wt 210.0 lb

## 2020-11-24 DIAGNOSIS — I110 Hypertensive heart disease with heart failure: Secondary | ICD-10-CM | POA: Diagnosis not present

## 2020-11-24 DIAGNOSIS — Z7901 Long term (current) use of anticoagulants: Secondary | ICD-10-CM | POA: Insufficient documentation

## 2020-11-24 DIAGNOSIS — I2510 Atherosclerotic heart disease of native coronary artery without angina pectoris: Secondary | ICD-10-CM

## 2020-11-24 DIAGNOSIS — I429 Cardiomyopathy, unspecified: Secondary | ICD-10-CM | POA: Insufficient documentation

## 2020-11-24 DIAGNOSIS — I209 Angina pectoris, unspecified: Secondary | ICD-10-CM | POA: Diagnosis present

## 2020-11-24 DIAGNOSIS — Z79899 Other long term (current) drug therapy: Secondary | ICD-10-CM | POA: Diagnosis not present

## 2020-11-24 DIAGNOSIS — I5020 Unspecified systolic (congestive) heart failure: Secondary | ICD-10-CM | POA: Insufficient documentation

## 2020-11-24 DIAGNOSIS — Z20822 Contact with and (suspected) exposure to covid-19: Secondary | ICD-10-CM | POA: Insufficient documentation

## 2020-11-24 DIAGNOSIS — I10 Essential (primary) hypertension: Secondary | ICD-10-CM | POA: Diagnosis present

## 2020-11-24 DIAGNOSIS — I700 Atherosclerosis of aorta: Secondary | ICD-10-CM

## 2020-11-24 DIAGNOSIS — J449 Chronic obstructive pulmonary disease, unspecified: Secondary | ICD-10-CM | POA: Diagnosis not present

## 2020-11-24 DIAGNOSIS — I5043 Acute on chronic combined systolic (congestive) and diastolic (congestive) heart failure: Principal | ICD-10-CM | POA: Diagnosis present

## 2020-11-24 DIAGNOSIS — Z720 Tobacco use: Secondary | ICD-10-CM | POA: Diagnosis present

## 2020-11-24 DIAGNOSIS — Z7982 Long term (current) use of aspirin: Secondary | ICD-10-CM | POA: Diagnosis not present

## 2020-11-24 DIAGNOSIS — E782 Mixed hyperlipidemia: Secondary | ICD-10-CM | POA: Diagnosis not present

## 2020-11-24 DIAGNOSIS — F17200 Nicotine dependence, unspecified, uncomplicated: Secondary | ICD-10-CM | POA: Diagnosis not present

## 2020-11-24 DIAGNOSIS — I5023 Acute on chronic systolic (congestive) heart failure: Secondary | ICD-10-CM | POA: Diagnosis not present

## 2020-11-24 DIAGNOSIS — I2584 Coronary atherosclerosis due to calcified coronary lesion: Secondary | ICD-10-CM

## 2020-11-24 HISTORY — DX: Unspecified systolic (congestive) heart failure: I50.20

## 2020-11-24 LAB — CBC WITH DIFFERENTIAL/PLATELET
Abs Immature Granulocytes: 0.01 10*3/uL (ref 0.00–0.07)
Basophils Absolute: 0 10*3/uL (ref 0.0–0.1)
Basophils Relative: 0 %
Eosinophils Absolute: 0.4 10*3/uL (ref 0.0–0.5)
Eosinophils Relative: 6 %
HCT: 44.4 % (ref 39.0–52.0)
Hemoglobin: 15.8 g/dL (ref 13.0–17.0)
Immature Granulocytes: 0 %
Lymphocytes Relative: 18 %
Lymphs Abs: 1.1 10*3/uL (ref 0.7–4.0)
MCH: 32.3 pg (ref 26.0–34.0)
MCHC: 35.6 g/dL (ref 30.0–36.0)
MCV: 90.8 fL (ref 80.0–100.0)
Monocytes Absolute: 0.5 10*3/uL (ref 0.1–1.0)
Monocytes Relative: 9 %
Neutro Abs: 4.2 10*3/uL (ref 1.7–7.7)
Neutrophils Relative %: 67 %
Platelets: 119 10*3/uL — ABNORMAL LOW (ref 150–400)
RBC: 4.89 MIL/uL (ref 4.22–5.81)
RDW: 12.4 % (ref 11.5–15.5)
WBC: 6.3 10*3/uL (ref 4.0–10.5)
nRBC: 0 % (ref 0.0–0.2)

## 2020-11-24 LAB — BASIC METABOLIC PANEL
Anion gap: 13 (ref 5–15)
BUN: 15 mg/dL (ref 6–20)
CO2: 23 mmol/L (ref 22–32)
Calcium: 9.7 mg/dL (ref 8.9–10.3)
Chloride: 102 mmol/L (ref 98–111)
Creatinine, Ser: 0.89 mg/dL (ref 0.61–1.24)
GFR, Estimated: >60 mL/min (ref >60)
Glucose, Bld: 110 mg/dL — ABNORMAL HIGH (ref 70–99)
Potassium: 4.2 mmol/L (ref 3.5–5.1)
Sodium: 138 mmol/L (ref 135–145)

## 2020-11-24 LAB — BRAIN NATRIURETIC PEPTIDE: B Natriuretic Peptide: 86.2 pg/mL (ref 0.0–100.0)

## 2020-11-24 LAB — TROPONIN I (HIGH SENSITIVITY): Troponin I (High Sensitivity): 15 ng/L (ref <18)

## 2020-11-24 MED ORDER — NICOTINE 21 MG/24HR TD PT24
21.0000 mg | MEDICATED_PATCH | Freq: Every day | TRANSDERMAL | Status: DC
  Administered 2020-11-24 – 2020-11-26 (×3): 21 mg via TRANSDERMAL
  Filled 2020-11-24 (×3): qty 1

## 2020-11-24 MED ORDER — ENOXAPARIN SODIUM 150 MG/ML ~~LOC~~ SOLN: 30.0000 mg | Freq: Two times a day (BID) | SUBCUTANEOUS | Status: DC

## 2020-11-24 MED ORDER — FUROSEMIDE 10 MG/ML IJ SOLN
80.0000 mg | Freq: Two times a day (BID) | INTRAMUSCULAR | Status: DC
  Administered 2020-11-24 – 2020-11-25 (×3): 80 mg via INTRAVENOUS
  Filled 2020-11-24 (×4): qty 8

## 2020-11-24 MED ORDER — SODIUM CHLORIDE 0.9% FLUSH
3.0000 mL | INTRAVENOUS | Status: DC | PRN
Start: 2020-11-24 — End: 2020-11-26

## 2020-11-24 MED ORDER — SODIUM CHLORIDE 0.9 % IV SOLN
250.0000 mL | INTRAVENOUS | Status: DC | PRN
Start: 2020-11-24 — End: 2020-11-26

## 2020-11-24 MED ORDER — SODIUM CHLORIDE 0.9 % IV SOLN: 250.0000 mL | INTRAVENOUS | Status: DC | PRN

## 2020-11-24 MED ORDER — LISINOPRIL 2.5 MG PO TABS
2.5000 mg | ORAL_TABLET | Freq: Every day | ORAL | Status: DC
  Administered 2020-11-25 – 2020-11-26 (×2): 2.5 mg via ORAL
  Filled 2020-11-24 (×3): qty 1

## 2020-11-24 MED ORDER — ASPIRIN EC 81 MG PO TBEC
81.0000 mg | DELAYED_RELEASE_TABLET | Freq: Every day | ORAL | Status: DC
  Administered 2020-11-24 – 2020-11-25 (×2): 81 mg via ORAL
  Filled 2020-11-24 (×2): qty 1

## 2020-11-24 MED ORDER — SODIUM CHLORIDE 0.9% FLUSH: 3.0000 mL | INTRAVENOUS | Status: DC | PRN

## 2020-11-24 MED ORDER — ACETAMINOPHEN 325 MG PO TABS
650.0000 mg | ORAL_TABLET | ORAL | Status: DC | PRN
  Administered 2020-11-24 – 2020-11-25 (×3): 650 mg via ORAL
  Filled 2020-11-24 (×3): qty 2

## 2020-11-24 MED ORDER — ONDANSETRON HCL 4 MG/2ML IJ SOLN
4.0000 mg | Freq: Four times a day (QID) | INTRAMUSCULAR | Status: DC | PRN
  Administered 2020-11-24: 4 mg via INTRAVENOUS
  Filled 2020-11-24: qty 2

## 2020-11-24 MED ORDER — SPIRONOLACTONE 25 MG PO TABS
25.0000 mg | ORAL_TABLET | Freq: Every day | ORAL | Status: DC
  Administered 2020-11-25: 25 mg via ORAL
  Filled 2020-11-24: qty 1

## 2020-11-24 MED ORDER — FUROSEMIDE 10 MG/ML IJ SOLN: 80.0000 mg | Freq: Two times a day (BID) | INTRAMUSCULAR | Status: DC

## 2020-11-24 MED ORDER — ACETAMINOPHEN 325 MG PO TABS: 650.0000 mg | ORAL_TABLET | ORAL | Status: DC | PRN

## 2020-11-24 MED ORDER — SODIUM CHLORIDE 0.9% FLUSH: 3.0000 mL | Freq: Two times a day (BID) | INTRAVENOUS | Status: DC

## 2020-11-24 MED ORDER — ENOXAPARIN SODIUM 30 MG/0.3ML ~~LOC~~ SOLN
30.0000 mg | Freq: Two times a day (BID) | SUBCUTANEOUS | Status: DC
  Administered 2020-11-24 – 2020-11-25 (×3): 30 mg via SUBCUTANEOUS
  Filled 2020-11-24 (×3): qty 0.3

## 2020-11-24 MED ORDER — SODIUM CHLORIDE 0.9% FLUSH
3.0000 mL | Freq: Two times a day (BID) | INTRAVENOUS | Status: DC
  Administered 2020-11-24: 3 mL via INTRAVENOUS

## 2020-11-24 MED ORDER — SODIUM CHLORIDE 0.9 % IV SOLN: 4.0000 mg | Freq: Four times a day (QID) | INTRAVENOUS | Status: AC | PRN

## 2020-11-24 NOTE — Progress Notes (Signed)
Cardiology Admission History and Physical:   Patient ID: Cody Carney MRN: [id number]; DOB: 1961/05/09   Admission date: (Not on file)  PCP:  Patient, [name]   [facility]  Cardiologist:  Christell Constant, MD  Advanced Practice Provider:  No care team member to display Electrophysiologist:  None       Chief Complaint:  Worsening Shortness of breath  Patient Profile:   Cody Carney is a 60 y.o. male with HFrEF presenting with decompensated HF  History of Present Illness:   Cody Carney is a 41 M with a history of HTN, Aortic Atherosclerosis, CAC, COPD, and Tobacco Abuse who has been follow for HFrEF.  Since interval visits had has insurance issues, inability to get some medications, and has had worsening of his shortness of breath.  Seen last week in PharmD Clinic and given extra diuretics to prevent admission.  Was no an inpatient SGLT2 trialpreviously.  Patient notes that he is feeling bad.  Has had new exertional chest tightness.  Discomfort occurs at rest, worsens with exertion, and improves with rest.  Patient exertion notable for minimal ADLs  with SOB.  Notes shortness of breath DOE.  Notes PND and orthopnea:  Cannot sleep because he sometimes feels SOB laying flat.  Notes bendopnea, weight gain of 6 lbs, leg swelling , and abdominal swelling despite his new low salt diet.  No syncope or near syncope. Notes  no palpitations or funny heart beats.     Past Medical History:  Diagnosis Date  . CHF (congestive heart failure) (HCC)   . COPD (chronic obstructive pulmonary disease) (HCC)   . Hypertension     Past Surgical History:  Procedure Laterality Date  . LEG SURGERY       Medications Prior to Admission: Prior to Admission medications   Medication Sig Start Date End Date Taking? Authorizing Provider  aspirin 81 MG EC tablet Take 81 mg by mouth daily.   Yes [provider]  atorvastatin (LIPITOR) 40 MG tablet Take 1 tablet (40 mg total)  by mouth daily. 10/12/20  Yes Pernell Lenoir A, MD  carvedilol (COREG) 25 MG tablet Take 0.5 tablets (12.5 mg total) by mouth in the morning and at bedtime. 10/22/20  Yes Conlin Brahm A, MD  doxycycline (VIBRA-TABS) 100 MG tablet Take 1 tablet (100 mg total) by mouth every 12 (twelve) hours. 08/10/20  Yes Calvert Cantor, MD  furosemide (LASIX) 80 MG tablet Take 1 tablet (80 mg total) by mouth 3 (three) times daily. 11/18/20  Yes Sussan Meter A, MD  losartan (COZAAR) 50 MG tablet Take 0.5 tablets (25 mg total) by mouth daily. 10/22/20  Yes Herb Beltre A, MD  nicotine (NICODERM CQ - DOSED IN MG/24 HOURS) 21 mg/24hr patch Place 1 patch (21 mg total) onto the skin daily. 08/11/20  Yes Calvert Cantor, MD  potassium chloride (KLOR-CON) 10 MEQ tablet Take 10 mEq by mouth 2 (two) times daily. 08/10/20  Yes [provider]  spironolactone (ALDACTONE) 25 MG tablet Take 1 tablet (25 mg total) by mouth daily. 11/18/20  Yes Corazon Nickolas A, MD  lisinopril (ZESTRIL) 2.5 MG tablet Take 2.5 mg by mouth daily.    [provider]     Allergies:   No Known Allergies  Social History:   Social History   Socioeconomic History  . Marital status: Single    Spouse name: Not on file  . Number of children: Not on file  . Years of education:  Not on file  . Highest education level: Not on file  Occupational History  . Not on file  Tobacco Use  . Smoking status: Current Every Day Smoker  . Smokeless tobacco: Never Used  Substance and Sexual Activity  . Alcohol use: Yes  . Drug use: No  . Sexual activity: Not on file  Other Topics Concern  . Not on file  Social History Narrative  . Not on file   Social Determinants of Health   Financial Resource Strain: Not on file  Food Insecurity: Not on file  Transportation Needs: Not on file  Physical Activity: Not on file  Stress: Not on file  Social Connections: Not on file  Intimate Partner Violence: Not on file     Family History:   History of coronary artery disease notable for uncles have had MI. History of heart failure notable for no members. History of arrhythmia notable for uncle needing pacemaker. No history of bicuspid aortic valve or aortic aneurysm or dissection. ROS:  Please see the history of present illness.  All other ROS reviewed and negative.     Physical Exam/Data:   Vitals:   11/24/20 0941  BP: 124/82  Pulse: 90  SpO2: 97%  Weight: 210 lb (95.3 kg)  Height: 5' 8" (1.727 m)   @IOBRIEF@ Last 3 Weights 11/24/2020 10/22/2020 10/12/2020  Weight (lbs) 210 lb 206 lb 212 lb  Weight (kg) 95.255 kg 93.441 kg 96.163 kg     Body mass index is 31.93 kg/m.  General:  Obese male in mild distress HEENT: normal Lymph: no adenopathy Neck: to mid neck Endocrine:  No thryomegaly Vascular: No carotid bruits; FA pulses 2+ bilaterally without bruits  Cardiac:  normal S1, S2; RRR; no murmur  Lungs:  CTAB, OK air mvoement Abd: soft, nontender, no hepatomegaly  Ext: +2 edema bilatrerally Musculoskeletal:  No deformities, BUE and BLE strength normal and equal Skin: warm and dry  Neuro:  CNs 2-12 intact, no focal abnormalities noted Psych:  Normal affect   EKG:   11/24/20 SR 89 WNL  Relevant CV Studies: Bilateral knee and back pain  Laboratory Data:  High Sensitivity Troponin:  No results for input(s): TROPONINIHS in the last 720 hours.    ChemistryNo results for input(s): NA, K, CL, CO2, GLUCOSE, BUN, CREATININE, CALCIUM, GFRNONAA, GFRAA, ANIONGAP in the last 168 hours.  No results for input(s): PROT, ALBUMIN, AST, ALT, ALKPHOS, BILITOT in the last 168 hours. HematologyNo results for input(s): WBC, RBC, HGB, HCT, MCV, MCH, MCHC, RDW, PLT in the last 168 hours. BNPNo results for input(s): BNP, PROBNP in the last 168 hours.  DDimer No results for input(s): DDIMER in the last 168 hours.   Radiology/Studies:  No results found.   Assessment and Plan:    Heart Failure reduced  Ejection Fraction  Chest Pain syndrome - appropriate for admission; attempted salvage at prior visit with increasing diuretics - will send BNP - NYHA class III-IV, Stage C, hypervolemic, etiology suspected CAD - Diuretic regimen: Lasix 80 IV BID - Strict I/Os, daily weights, and fluid restriction of < 2 L  - will check BMP/BNP/Mg/CBC/ troponin - Will hold coreg during initial inpatient assessment - continue lisinopril 2.5 mg and  aldactone25 mg - SGLT2i may be started once insurance is confirmed (has [employer] and brought in MEDICAID APPROVAL) - would benefit from LHC and RHC through course  Admission is indicated  Cardiac Diet Telemetry needed Lovenox DVT PPX Full Code  ORDERS Are Signed and Pended (exept   for the troponin which was ordered in the admission encounter).  Please release the orders on arrival  Risk Assessment/Risk Scores:     HEAR Score (for undifferentiated chest pain):     New York Heart Association (NYHA) Functional Class NYHA Class III     Severity of Illness: The appropriate patient status for this patient is INPATIENT. Inpatient status is judged to be reasonable and necessary in order to provide the required intensity of service to ensure the patient's safety. The patient's presenting symptoms, physical exam findings, and initial radiographic and laboratory data in the context of their chronic comorbidities is felt to place them at high risk for further clinical deterioration. Furthermore, it is not anticipated that the patient will be medically stable for discharge from the hospital within 2 midnights of admission. The following factors support the patient status of inpatient.   " The patient's presenting symptoms include PND and orthopnea and chest pain. " The worrisome physical exam findings include JDV and LE edema. " The chronic co-morbidities include HFREF.   * I certify that at the point of admission it is my clinical judgment that the patient will  require inpatient hospital care spanning beyond 2 midnights from the point of admission due to high intensity of service, high risk for further deterioration and high frequency of surveillance required.*    For questions or updates, please contact [facility] Please consult [email or web address] for contact info under     Signed, Christell Constant, MD  11/24/2020 10:26 AM

## 2020-11-24 NOTE — H&P (View-Only) (Signed)
Cardiology Admission History and Physical:   Patient ID: Cody Carney MRN: [id number]; DOB: 1961/05/09   Admission date: (Not on file)  PCP:  Patient, [name]   [facility]  Cardiologist:  Christell Constant, MD  Advanced Practice Provider:  No care team member to display Electrophysiologist:  None       Chief Complaint:  Worsening Shortness of breath  Patient Profile:   Cody Carney is a 60 y.o. male with HFrEF presenting with decompensated HF  History of Present Illness:   Cody Carney is a 41 M with a history of HTN, Aortic Atherosclerosis, CAC, COPD, and Tobacco Abuse who has been follow for HFrEF.  Since interval visits had has insurance issues, inability to get some medications, and has had worsening of his shortness of breath.  [facility] and given extra diuretics to prevent admission.  Was no an inpatient SGLT2 trialpreviously.  Patient notes that he is feeling bad.  Has had new exertional chest tightness.  Discomfort occurs at rest, worsens with exertion, and improves with rest.  Patient exertion notable for minimal ADLs  with SOB.  Notes shortness of breath DOE.  Notes PND and orthopnea:  Cannot sleep because he sometimes feels SOB laying flat.  Notes bendopnea, weight gain of 6 lbs, leg swelling , and abdominal swelling despite his new low salt diet.  No syncope or near syncope. Notes  no palpitations or funny heart beats.     Past Medical History:  Diagnosis Date  . CHF (congestive heart failure) (HCC)   . COPD (chronic obstructive pulmonary disease) (HCC)   . Hypertension     Past Surgical History:  Procedure Laterality Date  . LEG SURGERY       Medications Prior to Admission: Prior to Admission medications   Medication Sig Start Date End Date Taking? Authorizing Provider  aspirin 81 MG EC tablet Take 81 mg by mouth daily.   Yes [provider]  atorvastatin (LIPITOR) 40 MG tablet Take 1 tablet (40 mg total)  by mouth daily. 10/12/20  Yes Maclaine Ahola A, MD  carvedilol (COREG) 25 MG tablet Take 0.5 tablets (12.5 mg total) by mouth in the morning and at bedtime. 10/22/20  Yes Zyia Kaneko A, MD  doxycycline (VIBRA-TABS) 100 MG tablet Take 1 tablet (100 mg total) by mouth every 12 (twelve) hours. 08/10/20  Yes Calvert Cantor, MD  furosemide (LASIX) 80 MG tablet Take 1 tablet (80 mg total) by mouth 3 (three) times daily. 11/18/20  Yes Waneda Klammer A, MD  losartan (COZAAR) 50 MG tablet Take 0.5 tablets (25 mg total) by mouth daily. 10/22/20  Yes Marie Borowski A, MD  nicotine (NICODERM CQ - DOSED IN MG/24 HOURS) 21 mg/24hr patch Place 1 patch (21 mg total) onto the skin daily. 08/11/20  Yes Calvert Cantor, MD  potassium chloride (KLOR-CON) 10 MEQ tablet Take 10 mEq by mouth 2 (two) times daily. 08/10/20  Yes [provider]  spironolactone (ALDACTONE) 25 MG tablet Take 1 tablet (25 mg total) by mouth daily. 11/18/20  Yes Kelle Ruppert A, MD  lisinopril (ZESTRIL) 2.5 MG tablet Take 2.5 mg by mouth daily.    [provider]     Allergies:   No Known Allergies  Social History:   Social History   Socioeconomic History  . Marital status: Single    Spouse name: Not on file  . Number of children: Not on file  . Years of education:  Not on file  . Highest education level: Not on file  Occupational History  . Not on file  Tobacco Use  . Smoking status: Current Every Day Smoker  . Smokeless tobacco: Never Used  Substance and Sexual Activity  . Alcohol use: Yes  . Drug use: No  . Sexual activity: Not on file  Other Topics Concern  . Not on file  Social History Narrative  . Not on file   Social Determinants of Health   Financial Resource Strain: Not on file  Food Insecurity: Not on file  Transportation Needs: Not on file  Physical Activity: Not on file  Stress: Not on file  Social Connections: Not on file  Intimate Partner Violence: Not on file     Family History:   History of coronary artery disease notable for uncles have had MI. History of heart failure notable for no members. History of arrhythmia notable for uncle needing pacemaker. No history of bicuspid aortic valve or aortic aneurysm or dissection. ROS:  Please see the history of present illness.  All other ROS reviewed and negative.     Physical Exam/Data:   Vitals:   11/24/20 0941  BP: 124/82  Pulse: 90  SpO2: 97%  Weight: 210 lb (95.3 kg)  Height: 5\' 8"  (1.727 m)   @IOBRIEF @ Last 3 Weights 11/24/2020 10/22/2020 10/12/2020  Weight (lbs) 210 lb 206 lb 212 lb  Weight (kg) 95.255 kg 93.441 kg 96.163 kg     Body mass index is 31.93 kg/m.  General:  Obese male in mild distress HEENT: normal Lymph: no adenopathy Neck: to mid neck Endocrine:  No thryomegaly Vascular: No carotid bruits; FA pulses 2+ bilaterally without bruits  Cardiac:  normal S1, S2; RRR; no murmur  Lungs:  CTAB, OK air mvoement Abd: soft, nontender, no hepatomegaly  Ext: +2 edema bilatrerally Musculoskeletal:  No deformities, BUE and BLE strength normal and equal Skin: warm and dry  Neuro:  CNs 2-12 intact, no focal abnormalities noted Psych:  Normal affect   EKG:   11/24/20 SR 89 WNL  Relevant CV Studies: Bilateral knee and back pain  Laboratory Data:  High Sensitivity Troponin:  No results for input(s): TROPONINIHS in the last 720 hours.    ChemistryNo results for input(s): NA, K, CL, CO2, GLUCOSE, BUN, CREATININE, CALCIUM, GFRNONAA, GFRAA, ANIONGAP in the last 168 hours.  No results for input(s): PROT, ALBUMIN, AST, ALT, ALKPHOS, BILITOT in the last 168 hours. HematologyNo results for input(s): WBC, RBC, HGB, HCT, MCV, MCH, MCHC, RDW, PLT in the last 168 hours. BNPNo results for input(s): BNP, PROBNP in the last 168 hours.  DDimer No results for input(s): DDIMER in the last 168 hours.   Radiology/Studies:  No results found.   Assessment and Plan:    Heart Failure reduced  Ejection Fraction  Chest Pain syndrome - appropriate for admission; attempted salvage at prior visit with increasing diuretics - will send BNP - NYHA class III-IV, Stage C, hypervolemic, etiology suspected CAD - Diuretic regimen: Lasix 80 IV BID - Strict I/Os, daily weights, and fluid restriction of < 2 L  - will check BMP/BNP/Mg/CBC/ troponin - Will hold coreg during initial inpatient assessment - continue lisinopril 2.5 mg and  aldactone25 mg - SGLT2i may be started once insurance is confirmed (has [employer] and brought in MEDICAID APPROVAL) - would benefit from [facility] and [facility] through course  Admission is indicated  Cardiac Diet Telemetry needed Lovenox DVT PPX Full Code  ORDERS Are Signed and Pended (exept  for the troponin which was ordered in the admission encounter).  Please release the orders on arrival  Risk Assessment/Risk Scores:     HEAR Score (for undifferentiated chest pain):     New York Heart Association (NYHA) Functional Class NYHA Class III     Severity of Illness: The appropriate patient status for this patient is INPATIENT. Inpatient status is judged to be reasonable and necessary in order to provide the required intensity of service to ensure the patient's safety. The patient's presenting symptoms, physical exam findings, and initial radiographic and laboratory data in the context of their chronic comorbidities is felt to place them at high risk for further clinical deterioration. Furthermore, it is not anticipated that the patient will be medically stable for discharge from the hospital within 2 midnights of admission. The following factors support the patient status of inpatient.   " The patient's presenting symptoms include PND and orthopnea and chest pain. " The worrisome physical exam findings include JDV and LE edema. " The chronic co-morbidities include HFREF.   * I certify that at the point of admission it is my clinical judgment that the patient will  require inpatient hospital care spanning beyond 2 midnights from the point of admission due to high intensity of service, high risk for further deterioration and high frequency of surveillance required.*    For questions or updates, please contact [facility] Please consult [email or web address] for contact info under     Signed, Christell Constant, MD  11/24/2020 10:26 AM

## 2020-11-25 ENCOUNTER — Inpatient Hospital Stay (HOSPITAL_COMMUNITY): Payer: PRIVATE HEALTH INSURANCE

## 2020-11-25 DIAGNOSIS — I429 Cardiomyopathy, unspecified: Secondary | ICD-10-CM | POA: Diagnosis not present

## 2020-11-25 DIAGNOSIS — I5020 Unspecified systolic (congestive) heart failure: Secondary | ICD-10-CM

## 2020-11-25 DIAGNOSIS — Z20822 Contact with and (suspected) exposure to covid-19: Secondary | ICD-10-CM | POA: Diagnosis not present

## 2020-11-25 DIAGNOSIS — I5043 Acute on chronic combined systolic (congestive) and diastolic (congestive) heart failure: Secondary | ICD-10-CM | POA: Diagnosis not present

## 2020-11-25 DIAGNOSIS — I5021 Acute systolic (congestive) heart failure: Secondary | ICD-10-CM | POA: Diagnosis not present

## 2020-11-25 DIAGNOSIS — I110 Hypertensive heart disease with heart failure: Secondary | ICD-10-CM | POA: Diagnosis not present

## 2020-11-25 LAB — BASIC METABOLIC PANEL
Anion gap: 10 (ref 5–15)
BUN: 20 mg/dL (ref 6–20)
CO2: 26 mmol/L (ref 22–32)
Calcium: 9.5 mg/dL (ref 8.9–10.3)
Chloride: 99 mmol/L (ref 98–111)
Creatinine, Ser: 1.04 mg/dL (ref 0.61–1.24)
GFR, Estimated: >60 mL/min (ref >60)
Glucose, Bld: 113 mg/dL — ABNORMAL HIGH (ref 70–99)
Potassium: 3.5 mmol/L (ref 3.5–5.1)
Sodium: 135 mmol/L (ref 135–145)

## 2020-11-25 LAB — ECHOCARDIOGRAM COMPLETE
Area-P 1/2: 4.46 cm2
Calc EF: 42.2 %
Height: 68 in
MV M vel: 4.89 m/s
MV Peak grad: 95.6 mmHg
S' Lateral: 4.6 cm
Single Plane A2C EF: 34.1 %
Single Plane A4C EF: 47.5 %
Weight: 3259.28 oz

## 2020-11-25 LAB — CBC WITH DIFFERENTIAL/PLATELET
Abs Immature Granulocytes: 0.04 10*3/uL (ref 0.00–0.07)
Basophils Absolute: 0 10*3/uL (ref 0.0–0.1)
Basophils Relative: 1 %
Eosinophils Absolute: 0.5 10*3/uL (ref 0.0–0.5)
Eosinophils Relative: 7 %
HCT: 42.2 % (ref 39.0–52.0)
Hemoglobin: 14.9 g/dL (ref 13.0–17.0)
Immature Granulocytes: 1 %
Lymphocytes Relative: 30 %
Lymphs Abs: 2 10*3/uL (ref 0.7–4.0)
MCH: 32.3 pg (ref 26.0–34.0)
MCHC: 35.3 g/dL (ref 30.0–36.0)
MCV: 91.3 fL (ref 80.0–100.0)
Monocytes Absolute: 0.7 10*3/uL (ref 0.1–1.0)
Monocytes Relative: 11 %
Neutro Abs: 3.3 10*3/uL (ref 1.7–7.7)
Neutrophils Relative %: 50 %
Platelets: 122 10*3/uL — ABNORMAL LOW (ref 150–400)
RBC: 4.62 MIL/uL (ref 4.22–5.81)
RDW: 12.3 % (ref 11.5–15.5)
WBC: 6.5 10*3/uL (ref 4.0–10.5)
nRBC: 0 % (ref 0.0–0.2)

## 2020-11-25 MED ORDER — DAPAGLIFLOZIN PROPANEDIOL 10 MG PO TABS
10.0000 mg | ORAL_TABLET | Freq: Every day | ORAL | Status: DC
  Administered 2020-11-25: 10 mg via ORAL
  Filled 2020-11-25 (×2): qty 1

## 2020-11-25 MED ORDER — SODIUM CHLORIDE 0.9% FLUSH
3.0000 mL | Freq: Two times a day (BID) | INTRAVENOUS | Status: DC
  Administered 2020-11-25 – 2020-11-26 (×3): 3 mL via INTRAVENOUS

## 2020-11-25 MED ORDER — SODIUM CHLORIDE 0.9 % IV SOLN: 250.0000 mL | INTRAVENOUS | Status: DC | PRN

## 2020-11-25 MED ORDER — SODIUM CHLORIDE 0.9% FLUSH: 3.0000 mL | INTRAVENOUS | Status: DC | PRN

## 2020-11-25 MED ORDER — ASPIRIN 81 MG PO CHEW
81.0000 mg | CHEWABLE_TABLET | ORAL | Status: AC
  Administered 2020-11-26: 81 mg via ORAL
  Filled 2020-11-25: qty 1

## 2020-11-25 MED ORDER — SODIUM CHLORIDE 0.9 % IV SOLN: INTRAVENOUS | Status: DC

## 2020-11-25 NOTE — Plan of Care (Signed)

## 2020-11-25 NOTE — Progress Notes (Signed)
  Echocardiogram 2D Echocardiogram has been performed.  Cody Carney 11/25/2020, 10:22 AM

## 2020-11-25 NOTE — Progress Notes (Signed)
Progress Note  Patient Name: Cody Carney Date of Encounter: 11/25/2020  [facility] Cardiologist: Christell Constant, MD   Subjective   Feeling better but not quite 50% better.  He describes waking up at night feeling like he cannot breathe and he snores.  Endorses daytime fatigue.  Will need a sleep study for snoring.  Will coordinate with primary cardiologist.  Diuresing well, urinal at the bedside full.  Inpatient Medications    Scheduled Meds: . aspirin EC  81 mg Oral Daily  . enoxaparin (LOVENOX) injection  30 mg Subcutaneous Q12H  . furosemide  80 mg Intravenous BID  . lisinopril  2.5 mg Oral Daily  . nicotine  21 mg Transdermal Daily  . sodium chloride flush  3 mL Intravenous Q12H  . spironolactone  25 mg Oral Daily   Continuous Infusions: . sodium chloride     PRN Meds: sodium chloride, acetaminophen, ondansetron (ZOFRAN) IV, sodium chloride flush   Vital Signs    Vitals:   11/25/20 0038 11/25/20 0345 11/25/20 0623 11/25/20 0728  BP: 104/71 109/65  125/78  Pulse: 81 64  75  Resp: 19 14  10   Temp: 98.2 F (36.8 C) 98.1 F (36.7 C)  98 F (36.7 C)  TempSrc: Oral Oral  Oral  SpO2: 91% 95%  94%  Weight:   92.4 kg   Height:   5\' 8"  (1.727 m)     Intake/Output Summary (Last 24 hours) at 11/25/2020 0825 Last data filed at 11/25/2020 0751 Gross per 24 hour  Intake 493 ml  Output 600 ml  Net -107 ml   Last 3 Weights 11/25/2020 11/24/2020 10/22/2020  Weight (lbs) 203 lb 11.3 oz 210 lb 206 lb  Weight (kg) 92.4 kg 95.255 kg 93.441 kg      Telemetry    SR - Personally Reviewed  ECG    pending - Personally Reviewed  Physical Exam   GEN: No acute distress.   Neck: No JVD Cardiac: RRR, no murmurs, rubs, or gallops.  Respiratory: Clear to auscultation bilaterally. GI: Soft, nontender, non-distended  MS: No edema; No deformity. Neuro:  Nonfocal  Psych: Normal affect   Labs    High Sensitivity Troponin:   Recent Labs  Lab 11/24/20 1309   TROPONINIHS 15      Chemistry Recent Labs  Lab 11/24/20 1309 11/25/20 0035  NA 138 135  K 4.2 3.5  CL 102 99  CO2 23 26  GLUCOSE 110* 113*  BUN 15 20  CREATININE 0.89 1.04  CALCIUM 9.7 9.5  GFRNONAA >60 >60  ANIONGAP 13 10     Hematology Recent Labs  Lab 11/24/20 1309 11/25/20 0035  WBC 6.3 6.5  RBC 4.89 4.62  HGB 15.8 14.9  HCT 44.4 42.2  MCV 90.8 91.3  MCH 32.3 32.3  MCHC 35.6 35.3  RDW 12.4 12.3  PLT 119* 122*    BNP Recent Labs  Lab 11/24/20 1309  BNP 86.2     DDimer No results for input(s): DDIMER in the last 168 hours.   Radiology    No results found.  Cardiac Studies   Repeat echo pending.  Patient Profile     60 y.o. male with HFrEF, HTN, Ao Atherosclerosis, CAC with nonobstructive CAD on cath within last 5 years, tobacco use who presents with HF decompensation and chest pain.    Assessment & Plan    Principal Problem:   HFrEF (heart failure with reduced ejection fraction) (HCC) Active Problems:   COPD (chronic  obstructive pulmonary disease) (HCC)   Essential hypertension   Acute on chronic combined systolic and diastolic CHF (congestive heart failure) (HCC)   Ischemic chest pain (HCC)   Tobacco abuse   Aortic atherosclerosis (HCC)   Coronary artery calcification   Mixed hyperlipidemia  HFrEF - report of nonobstructive CAD however back with chest pain and HF decompensation. With [facility] will perform LHC as well. Diuresing well, not fully charted yet. Plan for [facility] tomorrow. Consent as noted below. - on asa 81 mg daily  Cr: 0.89 >> 1.04 UOP yesterday: not charted, 600 ML out today.  Weight: 95 >> 92 kg Net negative for admission: -100 mL, not charted Diuretic plan: continue lasix 80 mg IV BID Heart Failure Therapy ACE-I/ARB/ARNI: lisinopril 2.5 mg  BB: none yet, low normal HR MRA: spironolactone 25 mg daily SGLT2I: will check with pharmacist to start and coverage options. Plan to start farxiga 10 mg daily.   HTN - lisinopril,  spironolactone BP normal today.   CV risk stratification - needs lipid panel and hba1c in am.   INFORMED CONSENT: I have reviewed the risks, indications, and alternatives to cardiac catheterization, possible angioplasty, and stenting with the patient. Risks include but are not limited to bleeding, infection, vascular injury, stroke, myocardial infection, arrhythmia, kidney injury, radiation-related injury in the case of prolonged fluoroscopy use, emergency cardiac surgery, and death. The patient understands the risks of serious complication is 1-2 in 1000 with diagnostic cardiac cath and 1-2% or less with angioplasty/stenting.    For questions or updates, please contact [facility] Please consult [email or web address] for contact info under        Signed, Parke Poisson, MD  11/25/2020, 8:25 AM

## 2020-11-25 NOTE — Addendum Note (Signed)
Addended by: Oleta Mouse on: 11/25/2020 03:40 PM   Modules accepted: Orders

## 2020-11-25 NOTE — TOC Benefit Eligibility Note (Signed)
Transition of Care [facility]) Benefit Eligibility Note    Patient Details  Name: Anthoni Geerts MRN: [id number] Date of Birth: 03-16-1961   Medication/Dose: [name]  10 MG DAILY  Covered?: Yes  Tier:  (NO TIER)  Prescription Coverage Preferred Pharmacy: [facility] with Person/Company/Phone Number:: APRIL  @  [place] MEDICAID  [facility] RX # [phone]  Co-Pay: ZERO DOLLARS  Prior Approval: No  Deductible:  (NO DEDUCTIBLE WITH PLAN)  Additional Notes: PT'S MUST SHOW INS ID CARD AT PHARMACY    Mardene Sayer Phone Number: 11/25/2020, 2:24 PM

## 2020-11-26 ENCOUNTER — Other Ambulatory Visit (HOSPITAL_COMMUNITY): Payer: Self-pay | Admitting: Physician Assistant

## 2020-11-26 ENCOUNTER — Other Ambulatory Visit: Payer: PRIVATE HEALTH INSURANCE

## 2020-11-26 ENCOUNTER — Encounter (HOSPITAL_COMMUNITY): Payer: Self-pay | Admitting: Cardiovascular Disease

## 2020-11-26 ENCOUNTER — Encounter (HOSPITAL_COMMUNITY)
Admission: AD | Disposition: A | Discharge status: Home / Self Care | Payer: PRIVATE HEALTH INSURANCE | Source: Ambulatory Visit | Attending: Internal Medicine

## 2020-11-26 DIAGNOSIS — I5043 Acute on chronic combined systolic (congestive) and diastolic (congestive) heart failure: Secondary | ICD-10-CM | POA: Diagnosis not present

## 2020-11-26 DIAGNOSIS — I429 Cardiomyopathy, unspecified: Secondary | ICD-10-CM

## 2020-11-26 DIAGNOSIS — I5020 Unspecified systolic (congestive) heart failure: Secondary | ICD-10-CM | POA: Diagnosis not present

## 2020-11-26 HISTORY — PX: RIGHT/LEFT HEART CATH AND CORONARY ANGIOGRAPHY: CATH118266

## 2020-11-26 LAB — POCT I-STAT EG7
Acid-Base Excess: 4 mmol/L — ABNORMAL HIGH (ref 0.0–2.0)
Acid-Base Excess: 4 mmol/L — ABNORMAL HIGH (ref 0.0–2.0)
Bicarbonate: 28.8 mmol/L — ABNORMAL HIGH (ref 20.0–28.0)
Bicarbonate: 29 mmol/L — ABNORMAL HIGH (ref 20.0–28.0)
Calcium, Ion: 1.28 mmol/L (ref 1.15–1.40)
Calcium, Ion: 1.29 mmol/L (ref 1.15–1.40)
HCT: 46 % (ref 39.0–52.0)
HCT: 47 % (ref 39.0–52.0)
Hemoglobin: 15.6 g/dL (ref 13.0–17.0)
Hemoglobin: 16 g/dL (ref 13.0–17.0)
O2 Saturation: 62 %
O2 Saturation: 62 %
Potassium: 4.2 mmol/L (ref 3.5–5.1)
Potassium: 4.2 mmol/L (ref 3.5–5.1)
Sodium: 137 mmol/L (ref 135–145)
Sodium: 137 mmol/L (ref 135–145)
TCO2: 30 mmol/L (ref 22–32)
TCO2: 30 mmol/L (ref 22–32)
pCO2, Ven: 44.2 mmHg (ref 44.0–60.0)
pCO2, Ven: 44.8 mmHg (ref 44.0–60.0)
pH, Ven: 7.419 (ref 7.250–7.430)
pH, Ven: 7.422 (ref 7.250–7.430)
pO2, Ven: 32 mmHg (ref 32.0–45.0)
pO2, Ven: 32 mmHg (ref 32.0–45.0)

## 2020-11-26 LAB — BASIC METABOLIC PANEL
Anion gap: 12 (ref 5–15)
BUN: 21 mg/dL — ABNORMAL HIGH (ref 6–20)
CO2: 26 mmol/L (ref 22–32)
Calcium: 9.1 mg/dL (ref 8.9–10.3)
Chloride: 97 mmol/L — ABNORMAL LOW (ref 98–111)
Creatinine, Ser: 0.99 mg/dL (ref 0.61–1.24)
GFR, Estimated: >60 mL/min (ref >60)
Glucose, Bld: 125 mg/dL — ABNORMAL HIGH (ref 70–99)
Potassium: 3.4 mmol/L — ABNORMAL LOW (ref 3.5–5.1)
Sodium: 135 mmol/L (ref 135–145)

## 2020-11-26 LAB — CBC WITH DIFFERENTIAL/PLATELET
Abs Immature Granulocytes: 0.01 10*3/uL (ref 0.00–0.07)
Basophils Absolute: 0 10*3/uL (ref 0.0–0.1)
Basophils Relative: 1 %
Eosinophils Absolute: 0.5 10*3/uL (ref 0.0–0.5)
Eosinophils Relative: 8 %
HCT: 44.9 % (ref 39.0–52.0)
Hemoglobin: 15.6 g/dL (ref 13.0–17.0)
Immature Granulocytes: 0 %
Lymphocytes Relative: 32 %
Lymphs Abs: 2.1 10*3/uL (ref 0.7–4.0)
MCH: 31.7 pg (ref 26.0–34.0)
MCHC: 34.7 g/dL (ref 30.0–36.0)
MCV: 91.3 fL (ref 80.0–100.0)
Monocytes Absolute: 0.7 10*3/uL (ref 0.1–1.0)
Monocytes Relative: 10 %
Neutro Abs: 3.3 10*3/uL (ref 1.7–7.7)
Neutrophils Relative %: 49 %
Platelets: 117 10*3/uL — ABNORMAL LOW (ref 150–400)
RBC: 4.92 MIL/uL (ref 4.22–5.81)
RDW: 12.1 % (ref 11.5–15.5)
WBC: 6.6 10*3/uL (ref 4.0–10.5)
nRBC: 0 % (ref 0.0–0.2)

## 2020-11-26 LAB — POCT I-STAT 7, (LYTES, BLD GAS, ICA,H+H)
Acid-Base Excess: 2 mmol/L (ref 0.0–2.0)
Bicarbonate: 26.2 mmol/L (ref 20.0–28.0)
Calcium, Ion: 1.16 mmol/L (ref 1.15–1.40)
HCT: 46 % (ref 39.0–52.0)
Hemoglobin: 15.6 g/dL (ref 13.0–17.0)
O2 Saturation: 94 %
Potassium: 3.9 mmol/L (ref 3.5–5.1)
Sodium: 137 mmol/L (ref 135–145)
TCO2: 27 mmol/L (ref 22–32)
pCO2 arterial: 40 mmHg (ref 32.0–48.0)
pH, Arterial: 7.425 (ref 7.350–7.450)
pO2, Arterial: 67 mmHg — ABNORMAL LOW (ref 83.0–108.0)

## 2020-11-26 LAB — HEMOGLOBIN A1C
Hgb A1c MFr Bld: 5.8 % — ABNORMAL HIGH (ref 4.8–5.6)
Mean Plasma Glucose: 119.76 mg/dL

## 2020-11-26 LAB — LIPID PANEL
Cholesterol: 251 mg/dL — ABNORMAL HIGH (ref 0–200)
HDL: 33 mg/dL — ABNORMAL LOW (ref >40)
LDL Cholesterol: UNDETERMINED mg/dL (ref 0–99)
Total CHOL/HDL Ratio: 7.6 RATIO
Triglycerides: 429 mg/dL — ABNORMAL HIGH (ref <150)
VLDL: UNDETERMINED mg/dL (ref 0–40)

## 2020-11-26 LAB — SARS CORONAVIRUS 2 BY RT PCR (HOSPITAL ORDER, PERFORMED IN ~~LOC~~ HOSPITAL LAB): SARS Coronavirus 2: NEGATIVE

## 2020-11-26 LAB — LDL CHOLESTEROL, DIRECT: Direct LDL: 155.3 mg/dL — ABNORMAL HIGH (ref 0–99)

## 2020-11-26 SURGERY — RIGHT/LEFT HEART CATH AND CORONARY ANGIOGRAPHY
Anesthesia: LOCAL

## 2020-11-26 MED ORDER — LIDOCAINE HCL (PF) 1 % IJ SOLN
INTRAMUSCULAR | Status: DC | PRN
  Administered 2020-11-26 (×2): 2 mL

## 2020-11-26 MED ORDER — POTASSIUM CHLORIDE CRYS ER 10 MEQ PO TBCR: 10.0000 meq | EXTENDED_RELEASE_TABLET | Freq: Every day | ORAL | 3 refills | Status: DC

## 2020-11-26 MED ORDER — SODIUM CHLORIDE 0.9 % IV SOLN: INTRAVENOUS | Status: DC

## 2020-11-26 MED ORDER — DAPAGLIFLOZIN PROPANEDIOL 10 MG PO TABS: 10.0000 mg | ORAL_TABLET | Freq: Every day | ORAL | 3 refills | Status: DC

## 2020-11-26 MED ORDER — ATORVASTATIN CALCIUM 80 MG PO TABS
80.0000 mg | ORAL_TABLET | Freq: Every day | ORAL | Status: DC
  Administered 2020-11-26: 80 mg via ORAL
  Filled 2020-11-26: qty 1

## 2020-11-26 MED ORDER — MIDAZOLAM HCL 2 MG/2ML IJ SOLN
INTRAMUSCULAR | Status: DC | PRN
  Administered 2020-11-26: 2 mg via INTRAVENOUS

## 2020-11-26 MED ORDER — HYDRALAZINE HCL 20 MG/ML IJ SOLN: 10.0000 mg | INTRAMUSCULAR | Status: DC | PRN

## 2020-11-26 MED ORDER — FENTANYL CITRATE (PF) 100 MCG/2ML IJ SOLN
INTRAMUSCULAR | Status: DC | PRN
  Administered 2020-11-26 (×2): 50 ug via INTRAVENOUS

## 2020-11-26 MED ORDER — POTASSIUM CHLORIDE CRYS ER 20 MEQ PO TBCR
40.0000 meq | EXTENDED_RELEASE_TABLET | Freq: Once | ORAL | Status: AC
  Administered 2020-11-26: 40 meq via ORAL
  Filled 2020-11-26: qty 2

## 2020-11-26 MED ORDER — LIDOCAINE HCL (PF) 1 % IJ SOLN
INTRAMUSCULAR | Status: AC
  Filled 2020-11-26: qty 30

## 2020-11-26 MED ORDER — ONDANSETRON HCL 4 MG/2ML IJ SOLN: 4.0000 mg | Freq: Four times a day (QID) | INTRAMUSCULAR | Status: DC | PRN

## 2020-11-26 MED ORDER — HEPARIN (PORCINE) IN NACL 1000-0.9 UT/500ML-% IV SOLN
INTRAVENOUS | Status: AC
  Filled 2020-11-26: qty 1000

## 2020-11-26 MED ORDER — ACETAMINOPHEN 325 MG PO TABS: 650.0000 mg | ORAL_TABLET | ORAL | Status: DC | PRN

## 2020-11-26 MED ORDER — LABETALOL HCL 5 MG/ML IV SOLN: 10.0000 mg | INTRAVENOUS | Status: DC | PRN

## 2020-11-26 MED ORDER — SODIUM CHLORIDE 0.9% FLUSH: 3.0000 mL | INTRAVENOUS | Status: DC | PRN

## 2020-11-26 MED ORDER — MIDAZOLAM HCL 2 MG/2ML IJ SOLN
INTRAMUSCULAR | Status: AC
  Filled 2020-11-26: qty 2

## 2020-11-26 MED ORDER — FENTANYL CITRATE (PF) 100 MCG/2ML IJ SOLN
INTRAMUSCULAR | Status: AC
  Filled 2020-11-26: qty 2

## 2020-11-26 MED ORDER — SODIUM CHLORIDE 0.9 % IV SOLN: 250.0000 mL | INTRAVENOUS | Status: DC | PRN

## 2020-11-26 MED ORDER — DIAZEPAM 5 MG PO TABS: 5.0000 mg | ORAL_TABLET | Freq: Four times a day (QID) | ORAL | Status: DC | PRN

## 2020-11-26 MED ORDER — SODIUM CHLORIDE 0.9% FLUSH: 3.0000 mL | Freq: Two times a day (BID) | INTRAVENOUS | Status: DC

## 2020-11-26 MED ORDER — VERAPAMIL HCL 2.5 MG/ML IV SOLN
INTRAVENOUS | Status: AC
  Filled 2020-11-26: qty 2

## 2020-11-26 MED ORDER — CARVEDILOL 3.125 MG PO TABS: 3.1250 mg | ORAL_TABLET | Freq: Two times a day (BID) | ORAL | 3 refills | Status: DC

## 2020-11-26 MED ORDER — FUROSEMIDE 80 MG PO TABS: 80.0000 mg | ORAL_TABLET | Freq: Every day | ORAL | 3 refills | Status: DC

## 2020-11-26 MED ORDER — NICOTINE 21 MG/24HR TD PT24: 21.0000 mg | MEDICATED_PATCH | Freq: Every day | TRANSDERMAL | 0 refills | Status: DC

## 2020-11-26 MED ORDER — IOHEXOL 350 MG/ML SOLN
INTRAVENOUS | Status: DC | PRN
  Administered 2020-11-26: 45 mL

## 2020-11-26 MED ORDER — HEPARIN SODIUM (PORCINE) 1000 UNIT/ML IJ SOLN
INTRAMUSCULAR | Status: AC
  Filled 2020-11-26: qty 1

## 2020-11-26 MED ORDER — VERAPAMIL HCL 2.5 MG/ML IV SOLN
INTRAVENOUS | Status: DC | PRN
  Administered 2020-11-26: 10 mL via INTRA_ARTERIAL

## 2020-11-26 MED ORDER — HEPARIN SODIUM (PORCINE) 1000 UNIT/ML IJ SOLN
INTRAMUSCULAR | Status: DC | PRN
  Administered 2020-11-26: 4500 [IU] via INTRAVENOUS

## 2020-11-26 MED ORDER — ENOXAPARIN SODIUM 40 MG/0.4ML ~~LOC~~ SOLN: 40.0000 mg | SUBCUTANEOUS | Status: DC

## 2020-11-26 MED ORDER — LISINOPRIL 2.5 MG PO TABS: 2.5000 mg | ORAL_TABLET | Freq: Every day | ORAL | 3 refills | Status: DC

## 2020-11-26 MED ORDER — CARVEDILOL 3.125 MG PO TABS
3.1250 mg | ORAL_TABLET | Freq: Two times a day (BID) | ORAL | Status: DC
  Administered 2020-11-26 (×2): 3.125 mg via ORAL
  Filled 2020-11-26 (×2): qty 1

## 2020-11-26 MED ORDER — HEPARIN (PORCINE) IN NACL 1000-0.9 UT/500ML-% IV SOLN
INTRAVENOUS | Status: DC | PRN
  Administered 2020-11-26 (×2): 500 mL

## 2020-11-26 MED FILL — FARXIGA 10 MG TABLET: 10 | 30 days supply | Qty: 30 | Fill #0

## 2020-11-26 MED FILL — LISINOPRIL 2.5 MG TABLET: 2.5 | 30 days supply | Qty: 30 | Fill #0

## 2020-11-26 MED FILL — NICOTINE 21 MG/24HR PATCH: 21 | 28 days supply | Qty: 28 | Fill #0

## 2020-11-26 MED FILL — POTASSIUM CHL ER M10 TABLET: 10 | 30 days supply | Qty: 30 | Fill #0

## 2020-11-26 MED FILL — FUROSEMIDE 80 MG TAB: 80 | 40 days supply | Qty: 40 | Fill #0

## 2020-11-26 MED FILL — CARVEDILOL 3.125 MG TABLET: 3.125 | 30 days supply | Qty: 60 | Fill #0

## 2020-11-26 SURGICAL SUPPLY — 13 items
BAG SNAP BAND KOVER 36X36 (MISCELLANEOUS) ×2 IMPLANT
CATH BALLN WEDGE 5F 110CM (CATHETERS) ×2 IMPLANT
CATH OPTITORQUE TIG 4.0 5F (CATHETERS) ×2 IMPLANT
COVER DOME SNAP 22 D (MISCELLANEOUS) ×2 IMPLANT
DEVICE RAD COMP TR BAND LRG (VASCULAR PRODUCTS) ×2 IMPLANT
GLIDESHEATH SLEND SS 6F .021 (SHEATH) ×2 IMPLANT
GUIDEWIRE INQWIRE 1.5J.035X260 (WIRE) ×1 IMPLANT
INQWIRE 1.5J .035X260CM (WIRE) ×2
KIT HEART LEFT (KITS) ×2 IMPLANT
PACK CARDIAC CATHETERIZATION (CUSTOM PROCEDURE TRAY) ×2 IMPLANT
SHEATH GLIDE SLENDER 4/5FR (SHEATH) ×2 IMPLANT
TRANSDUCER W/STOPCOCK (MISCELLANEOUS) ×2 IMPLANT
TUBING CIL FLEX 10 FLL-RA (TUBING) ×2 IMPLANT

## 2020-11-26 NOTE — Progress Notes (Signed)
Heart Failure Stewardship Pharmacist Encounter  - Patient has been educated on current HF medications and potential additions to HF medication regimen - Patient verbalizes understanding that over the next few months, these medication doses may change and more medications may be added to optimize HF regimen - Patient has been educated on basic disease state pathophysiology and goals of therapy - Will follow up in HF [facility] clinic next week: instructed him to bring all medication to his appointment - Time spent 30 mins  Sharen Hones, PharmD, BCPS Heart Failure Stewardship Pharmacist Phone [phone]  Please check [email or web address] for unit-specific pharmacist phone numbers

## 2020-11-26 NOTE — Interval H&P Note (Signed)
History and Physical Interval Note:  H&P Note Retranscription for documentation purposes only.  Christell Constant, MD

## 2020-11-26 NOTE — Plan of Care (Signed)

## 2020-11-26 NOTE — Discharge Summary (Addendum)
Discharge Summary    Patient ID: Cody Carney MRN: [id number]; DOB: September 05, 1961  Admit date: 11/24/2020 Discharge date: 11/26/2020  PCP:  Patient, [name]   [facility]  Cardiologist:  Christell Constant, MD    Discharge Diagnoses    Principal Problem:   HFrEF (heart failure with reduced ejection fraction) (HCC) Active Problems:   COPD (chronic obstructive pulmonary disease) (HCC)   Essential hypertension   Acute on chronic combined systolic and diastolic CHF (congestive heart failure) (HCC)   Ischemic chest pain (HCC)   Tobacco abuse   Aortic atherosclerosis (HCC)   Coronary artery calcification   Mixed hyperlipidemia  Diagnostic Studies/Procedures     Echo 11/26/2019 Left ventricular ejection fraction, by estimation, is 30 to 35%. The  left ventricle has moderately decreased function. The left ventricle  demonstrates global hypokinesis. The left ventricular internal cavity size  was mildly to moderately dilated.  Left ventricular diastolic parameters are indeterminate.  2. Right ventricular systolic function is normal. The right ventricular  size is normal.  3. The mitral valve is normal in structure. Trivial mitral valve  regurgitation. No evidence of mitral stenosis.  4. The aortic valve is tricuspid. There is mild calcification of the  aortic valve. There is mild thickening of the aortic valve. Aortic valve  regurgitation is not visualized. No aortic stenosis is present.  5. The inferior vena cava is normal in size with <50% respiratory  variability, suggesting right atrial pressure of 8 mmHg.   RIGHT/LEFT HEART CATH AND CORONARY ANGIOGRAPHY 11/26/2020   Conclusion  Normal coronary arteries with a dominant left circumflex system.  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  RECOMMENDATION: Guideline directed medical therapy for HFrEF.  Smoking cessation.    History of  Present Illness     Cody Carney is a 60 y.o. male with history of HTN, COPD, chronic systolic heart failure and Tobacco abuse admitted from clinic due to heart failure.  Admitted November 2021 for acute systolic heart failure and hypertensive urgency.  Echocardiogram showed LV function of 25% and grade 1 diastolic dysfunction.  No wall motion abnormality.  He was diuresed and discharged.  Placed on Coreg and losartan.  Patient was seen in pharmacy clinic for heart failure medication management.  Initially management was difficult due to lack of insurance.  Seen by Dr. Izora Ribas in clinic November 24, 2020.  Patient had no exertional chest tightness and shortness of breath.  Cannot sleep because of shortness of breath while laying flat.  6 pound weight gain, abdominal swelling and leg edema.  Hospital Course     Consultants: None  The patient was admitted and started on IV Lasix 80 mg twice daily. Diuresed 1.5 L. Weight down 2 lb. felt better with improved symptoms.  Patient was treated with lisinopril and carvedilol 3.125mg  BID (low-dose given low normal heart rate, was on Coreg 12.5mg  BID at home).  Continued spironolactone and added [place].  Echocardiogram showed LV function of 30 to 35% and intermediate diastolic parameter.  Normal RV function.  Essential normal coronaries.  Low normal right heart pressure.  Felt nonischemic cardiomyopathy.  Recommended smoking cessation.  Patient will follow with heart failure impact clinic. BMP at follow up.   HR 80-90s (improved with reduced BB) & BP 110/60s at discharged >> up titrate medications outpatient.   Patient reported snoring and daytime fatigue.  Consider sleep study as outpatient.  Did the patient have an acute coronary syndrome (  MI, NSTEMI, STEMI, etc) this admission?:  No                               Did the patient have a percutaneous coronary intervention (stent / angioplasty)?:  No.    Discharge Vitals Blood pressure 111/62, pulse  85, temperature 98.2 F (36.8 C), temperature source Oral, resp. rate 15, height 5\' 8"  (1.727 m), weight 91.5 kg, SpO2 96 %.  Filed Weights   11/25/20 0623 11/26/20 0618  Weight: 92.4 kg 91.5 kg    Labs & Radiologic Studies    CBC Recent Labs    11/25/20 0035 11/26/20 0027 11/26/20 1237 11/26/20 1238 11/26/20 1257  WBC 6.5 6.6  --   --   --   NEUTROABS 3.3 3.3  --   --   --   HGB 14.9 15.6   < > 16.0 15.6  HCT 42.2 44.9   < > 47.0 46.0  MCV 91.3 91.3  --   --   --   PLT 122* 117*  --   --   --    < > = values in this interval not displayed.   Basic Metabolic Panel Recent Labs    11/28/20 0035 11/26/20 0027 11/26/20 1237 11/26/20 1238 11/26/20 1257  NA 135 135   < > 137 137  K 3.5 3.4*   < > 4.2 3.9  CL 99 97*  --   --   --   CO2 26 26  --   --   --   GLUCOSE 113* 125*  --   --   --   BUN 20 21*  --   --   --   CREATININE 1.04 0.99  --   --   --   CALCIUM 9.5 9.1  --   --   --    < > = values in this interval not displayed.   High Sensitivity Troponin:   Recent Labs  Lab 11/24/20 1309  TROPONINIHS 15   Hemoglobin A1C Recent Labs    11/26/20 0027  HGBA1C 5.8*   Fasting Lipid Panel Recent Labs    11/26/20 0027  CHOL 251*  HDL 33*  LDLCALC UNABLE TO CALCULATE IF TRIGLYCERIDE OVER 400 mg/dL  TRIG 11/28/20*  CHOLHDL 7.6  LDLDIRECT 155.3*  _____________  CARDIAC CATHETERIZATION  Result Date: 11/26/2020 Normal coronary arteries with a dominant left circumflex system. 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 RECOMMENDATION: Guideline directed medical therapy for HFrEF.  Smoking cessation.  ECHOCARDIOGRAM COMPLETE  Result Date: 11/25/2020    ECHOCARDIOGRAM REPORT   Patient Name:   Cody Carney Date of Exam: 11/25/2020 Medical Rec #:  11/27/2020  Height:       68.0 in Accession #:    [id number] Weight:       203.7 lb Date of Birth:  Feb 11, 1961  BSA:          2.060 m Patient Age:    59 years   BP:           125/78  mmHg Patient Gender: M          HR:           75 bpm. Exam Location:  Inpatient Procedure: 2D Echo, Cardiac Doppler and Color Doppler Indications:    CHF-Acute Systolic I50.21  History:        Patient has prior history of Echocardiogram examinations, most  recent 08/09/2020. CHF, COPD; Risk Factors:Hypertension and                 Current Smoker.  Sonographer:    Renella CunasJulia Swaim RDCS Referring Phys: [id number] Dr [facility] Mental Health CenterMAHESH A CHANDRASEKHAR IMPRESSIONS  1. Left ventricular ejection fraction, by estimation, is 30 to 35%. The left ventricle has moderately decreased function. The left ventricle demonstrates global hypokinesis. The left ventricular internal cavity size was mildly to moderately dilated. Left ventricular diastolic parameters are indeterminate.  2. Right ventricular systolic function is normal. The right ventricular size is normal.  3. The mitral valve is normal in structure. Trivial mitral valve regurgitation. No evidence of mitral stenosis.  4. The aortic valve is tricuspid. There is mild calcification of the aortic valve. There is mild thickening of the aortic valve. Aortic valve regurgitation is not visualized. No aortic stenosis is present.  5. The inferior vena cava is normal in size with <50% respiratory variability, suggesting right atrial pressure of 8 mmHg. Comparison(s): Prior images reviewed side by side. Changes from prior study are noted. Visually EF appears slightly improved compared to prior study, though still abnormal. FINDINGS  Left Ventricle: Left ventricular ejection fraction, by estimation, is 30 to 35%. The left ventricle has moderately decreased function. The left ventricle demonstrates global hypokinesis. The left ventricular internal cavity size was mildly to moderately  dilated. There is no left ventricular hypertrophy. Left ventricular diastolic parameters are indeterminate. Right Ventricle: The right ventricular size is normal. Right vetricular wall thickness was not well  visualized. Right ventricular systolic function is normal. Left Atrium: Left atrial size was normal in size. Right Atrium: Right atrial size was normal in size. Pericardium: There is no evidence of pericardial effusion. Mitral Valve: The mitral valve is normal in structure. Trivial mitral valve regurgitation. No evidence of mitral valve stenosis. Tricuspid Valve: The tricuspid valve is normal in structure. Tricuspid valve regurgitation is trivial. No evidence of tricuspid stenosis. Aortic Valve: The aortic valve is tricuspid. There is mild calcification of the aortic valve. There is mild thickening of the aortic valve. Aortic valve regurgitation is not visualized. No aortic stenosis is present. Pulmonic Valve: The pulmonic valve was grossly normal. Pulmonic valve regurgitation is not visualized. No evidence of pulmonic stenosis. Aorta: The aortic root, ascending aorta, aortic arch and descending aorta are all structurally normal, with no evidence of dilitation or obstruction. Venous: The inferior vena cava is normal in size with less than 50% respiratory variability, suggesting right atrial pressure of 8 mmHg. IAS/Shunts: The atrial septum is grossly normal.  LEFT VENTRICLE PLAX 2D LVIDd:         5.70 cm      Diastology LVIDs:         4.60 cm      LV e' medial:    4.65 cm/s LV PW:         0.90 cm      LV E/e' medial:  9.1 LV IVS:        0.90 cm      LV e' lateral:   5.55 cm/s LVOT diam:     2.40 cm      LV E/e' lateral: 7.6 LV SV:         62 LV SV Index:   30 LVOT Area:     4.52 cm  LV Volumes (MOD) LV vol d, MOD A2C: 179.0 ml LV vol d, MOD A4C: 202.0 ml LV vol s, MOD A2C: 118.0 ml LV vol s, MOD A4C: 106.0  ml LV SV MOD A2C:     61.0 ml LV SV MOD A4C:     202.0 ml LV SV MOD BP:      82.1 ml RIGHT VENTRICLE RV S prime:     10.40 cm/s TAPSE (M-mode): 1.9 cm LEFT ATRIUM             Index       RIGHT ATRIUM           Index LA diam:        4.20 cm 2.04 cm/m  RA Area:     15.00 cm LA Vol (A2C):   42.8 ml 20.78 ml/m RA  Volume:   38.80 ml  18.84 ml/m LA Vol (A4C):   37.2 ml 18.06 ml/m LA Biplane Vol: 39.8 ml 19.32 ml/m  AORTIC VALVE LVOT Vmax:   88.10 cm/s LVOT Vmean:  60.100 cm/s LVOT VTI:    0.137 m  AORTA Ao Root diam: 3.30 cm MITRAL VALVE MV Area (PHT): 4.46 cm    SHUNTS MV Decel Time: 170 msec    Systemic VTI:  0.14 m MR Peak grad: 95.6 mmHg    Systemic Diam: 2.40 cm MR Vmax:      489.00 cm/s MV E velocity: 42.20 cm/s MV A velocity: 68.60 cm/s MV E/A ratio:  0.62 Jodelle Red MD Electronically signed by Jodelle Red MD Signature Date/Time: 11/25/2020/4:03:30 PM    Final    Disposition   Pt is being discharged home today in good condition.  Follow-up Plans & Appointments     Follow-up Information    [facility]. Go on 12/02/2020.   Specialty: Cardiology Why: @ 3pm. [facility] (off [address]). Free Valet parking  Bring all medications with you. Wear a mask, support person can attend appointment. If you need a ride call [facility] the day before [phone] Contact information: [address] [phone] [facility] [address] [place] [address] [phone]       Christell Constant, MD. Go on 12/29/2020.   Specialty: Cardiology Why: @ 8:40am for hospital follow up  Contact information: [address] [place] [address] [phone]              Discharge Instructions    Diet - low sodium heart healthy   Complete by: As directed    Discharge instructions   Complete by: As directed    No driving for 48 hours.  No lifting over 5 lbs for 1 week. No sexual activity for 1 week. Keep procedure site clean & dry. If you notice increased pain, swelling, bleeding or pus, call/return!  You may shower, but no soaking baths/hot tubs/pools for 1 week.   Increase activity slowly   Complete by: As directed       Discharge Medications   Allergies as of 11/26/2020   No Known Allergies     Medication List     STOP taking these medications   albuterol 108 (90 Base) MCG/ACT inhaler Commonly known as: VENTOLIN HFA   doxycycline 100 MG tablet Commonly known as: VIBRA-TABS   losartan 50 MG tablet Commonly known as: COZAAR   TRELEGY ELLIPTA IN     TAKE these medications   acetaminophen 500 MG tablet Commonly known as: TYLENOL Take 1,000 mg by mouth every 6 (six) hours as needed for mild pain.   aspirin 81 MG EC tablet Take 81 mg by mouth daily.   atorvastatin 40 MG tablet Commonly known as:  LIPITOR Take 1 tablet (40 mg total) by mouth daily.   carvedilol 3.125 MG tablet Commonly known as: COREG Take 1 tablet (3.125 mg total) by mouth in the morning and at bedtime. What changed:   medication strength  how much to take   dapagliflozin propanediol 10 MG Tabs tablet Commonly known as: FARXIGA Take 1 tablet (10 mg total) by mouth daily. Start taking on: November 27, 2020   furosemide 80 MG tablet Commonly known as: LASIX Take 1 tablet (80 mg total) by mouth daily. Take extra 80mg  for 3lb weight gain What changed:   when to take this  additional instructions   lisinopril 2.5 MG tablet Commonly known as: ZESTRIL Take 1 tablet (2.5 mg total) by mouth daily.   nicotine 21 mg/24hr patch Commonly known as: NICODERM CQ - dosed in mg/24 hours Place 1 patch (21 mg total) onto the skin daily.   potassium chloride 10 MEQ tablet Commonly known as: KLOR-CON Take 1 tablet (10 mEq total) by mouth daily. What changed: when to take this   spironolactone 25 MG tablet Commonly known as: ALDACTONE Take 1 tablet (25 mg total) by mouth daily.          Outstanding Labs/Studies   BMP at follow up   Duration of Discharge Encounter   Greater than 30 minutes including physician time.  Signed , PA 11/26/2020, 3:42 PM  Patient's cardiac catheterization shows no obstructive CAD and low normal right heart pressure suggesting adequate diuresis.  Patient is stable  for hospital discharge.  Several medications were added to the patient's regimen. We will make TOC follow up for patient to ensure these were started and he as resources to obtain them as well. Patient may need cardiac MRI for further workup of cardiomyopathy - to be coordinated by Dr. 11/28/2020.

## 2020-11-26 NOTE — Progress Notes (Signed)
Heart Failure Nurse Navigator Progress Note  PCP: Patient, [name] PCP-Cardiologist: Merlyn Lot MD Admission Diagnosis: CHF Admitted from: lives with friend  Presentation:   Cody Carney presented with worsening SHOB  ECHO/ LVEF: 30-35%  Clinical Course:  Past Medical History:  Diagnosis Date  . CHF (congestive heart failure) (HCC)   . COPD (chronic obstructive pulmonary disease) (HCC)   . Hypertension      Social History   Socioeconomic History  . Marital status: Single    Spouse name: Not on file  . Number of children: Not on file  . Years of education: Not on file  . Highest education level: Not on file  Occupational History  . Not on file  Tobacco Use  . Smoking status: Current Every Day Smoker    Packs/day: 0.25    Years: 39.00    Pack years: 9.75    Types: Cigarettes  . Smokeless tobacco: Never Used  . Tobacco comment: encouraged smoking cessation, offered nicotine patch upon DC--relayed msg to primary team  Vaping Use  . Vaping Use: Never used  Substance and Sexual Activity  . Alcohol use: Yes    Alcohol/week: 2.0 standard drinks    Types: 2 Cans of beer per week    Comment: per pt statement 2 cans beer on weekend with friends occassionally  . Drug use: Not Currently    Types: Marijuana    Comment: Hasn't smoked in a "long time"  . Sexual activity: Not Currently  Other Topics Concern  . Not on file  Social History Narrative  . Not on file   Social Determinants of Health   Financial Resource Strain: High Risk  . Difficulty of Paying Living Expenses: Very hard  Food Insecurity: No Food Insecurity  . Worried About Programme researcher, broadcasting/film/video in the Last Year: Never true  . Ran Out of Food in the Last Year: Never true  Transportation Needs: Unmet Transportation Needs  . Lack of Transportation (Medical): No  . Lack of Transportation (Non-Medical): Yes  Physical Activity: Inactive  . Days of Exercise per Week: 0 days  . Minutes of Exercise per  Session: 0 min  Stress: Not on file  Social Connections: Not on file    High Risk Criteria for Readmission and/or Poor Patient Outcomes:  Heart failure hospital admissions (last 6 months): 2  No Show rate: 9%  Difficult social situation: yes  Demonstrates medication adherence: no  Primary Language: English  Literacy level: able to read/write and comprehend Education Assessment and Provision:  Detailed education and instructions provided on heart failure disease management including the following:  Signs and symptoms of Heart Failure When to call the physician Importance of daily weights Low sodium diet Fluid restriction Medication management Anticipated future follow-up appointments  Patient education given on each of the above topics.  Patient acknowledges understanding via teach back method and acceptance of all instructions.  Education Materials:  "Living Better With Heart Failure" Booklet, HF zone tool, & Daily Weight Tracker Tool.  Patient has scale at home: no, given from AHF clinic Patient has pill box at home: no, given from AHF clinic  Barriers of Care:   -financial constraints -no income/job  Considerations/Referrals:   Referral made to Heart Failure Pharmacist Stewardship: yes, will see at [facility] TOC appt. Referral made to Heart & Vascular TOC clinic: yes, 2/24 @ 3pm. Pt enrolled [facility] by this RN, but states he will have his daughter come to this appt.  Items for  Follow-up on DC/TOC: -no income, just received mediciad, working on disability -lives with friend (temporary), does not have to pay rent/bills. Has lived with daughter in the past, stayed on the couch. Has ex-wife. -on food stamps -medication cost -medication optimization -smoking cessation  Ozella Rocks, RN, BSN Heart Failure Nurse Navigator [phone]

## 2020-11-26 NOTE — Progress Notes (Addendum)
Progress Note  Patient Name: Cody Carney Date of Encounter: 11/26/2020  Primary Cardiologist: Christell Constant, MD   Subjective   Starting to feel back to baseline, feels his weight is near his dry weight.  Inpatient Medications    Scheduled Meds: . aspirin EC  81 mg Oral Daily  . dapagliflozin propanediol  10 mg Oral Daily  . enoxaparin (LOVENOX) injection  30 mg Subcutaneous Q12H  . furosemide  80 mg Intravenous BID  . lisinopril  2.5 mg Oral Daily  . nicotine  21 mg Transdermal Daily  . sodium chloride flush  3 mL Intravenous Q12H  . sodium chloride flush  3 mL Intravenous Q12H  . spironolactone  25 mg Oral Daily   Continuous Infusions: . sodium chloride    . sodium chloride    . sodium chloride 10 mL/hr at 11/26/20 0600   PRN Meds: sodium chloride, sodium chloride, acetaminophen, ondansetron (ZOFRAN) IV, sodium chloride flush, sodium chloride flush   Vital Signs    Vitals:   11/25/20 2320 11/26/20 0335 11/26/20 0618 11/26/20 0744  BP: 130/68 133/77  122/79  Pulse: 79 69  83  Resp: 15 14  16   Temp: 98.2 F (36.8 C) 98 F (36.7 C)  98 F (36.7 C)  TempSrc: Oral Oral  Oral  SpO2: 93% 97%  92%  Weight:   91.5 kg   Height:        Intake/Output Summary (Last 24 hours) at 11/26/2020 1046 Last data filed at 11/26/2020 0600 Gross per 24 hour  Intake 862.18 ml  Output 2100 ml  Net -1237.82 ml   Filed Weights   11/25/20 0623 11/26/20 0618  Weight: 92.4 kg 91.5 kg    Telemetry    Sinus rhythm- Personally Reviewed  ECG    No new- Personally Reviewed  Physical Exam   GEN: No acute distress.   Neck: No JVD Cardiac: regular rhythm, normal rate, no murmurs, rubs, or gallops.  Respiratory: Clear to auscultation bilaterally. GI: Soft, nontender, non-distended  MS: No edema; No deformity. Neuro:  Nonfocal  Psych: Normal affect   Labs    Chemistry Recent Labs  Lab 11/24/20 1309 11/25/20 0035 11/26/20 0027  NA 138 135 135  K 4.2 3.5 3.4*   CL 102 99 97*  CO2 23 26 26   GLUCOSE 110* 113* 125*  BUN 15 20 21*  CREATININE 0.89 1.04 0.99  CALCIUM 9.7 9.5 9.1  GFRNONAA >60 >60 >60  ANIONGAP 13 10 12      Hematology Recent Labs  Lab 11/24/20 1309 11/25/20 0035 11/26/20 0027  WBC 6.3 6.5 6.6  RBC 4.89 4.62 4.92  HGB 15.8 14.9 15.6  HCT 44.4 42.2 44.9  MCV 90.8 91.3 91.3  MCH 32.3 32.3 31.7  MCHC 35.6 35.3 34.7  RDW 12.4 12.3 12.1  PLT 119* 122* 117*    Cardiac EnzymesNo results for input(s): TROPONINI in the last 168 hours. No results for input(s): TROPIPOC in the last 168 hours.   BNP Recent Labs  Lab 11/24/20 1309  BNP 86.2     DDimer No results for input(s): DDIMER in the last 168 hours.   Radiology    ECHOCARDIOGRAM COMPLETE  Result Date: 11/25/2020    ECHOCARDIOGRAM REPORT   Patient Name:   Cody Carney Date of Exam: 11/25/2020 Medical Rec #:  11/26/20  Height:       68.0 in Accession #:    11/27/2020 Weight:       203.7 lb Date of Birth:  10-10-1960  BSA:          2.060 m Patient Age:    60 years   BP:           125/78 mmHg Patient Gender: M          HR:           75 bpm. Exam Location:  Inpatient Procedure: 2D Echo, Cardiac Doppler and Color Doppler Indications:    CHF-Acute Systolic I50.21  History:        Patient has prior history of Echocardiogram examinations, most                 recent 08/09/2020. CHF, COPD; Risk Factors:Hypertension and                 Current Smoker.  Sonographer:    Renella Cunas RDCS Referring Phys: [id number] [facility] A CHANDRASEKHAR IMPRESSIONS  1. Left ventricular ejection fraction, by estimation, is 30 to 35%. The left ventricle has moderately decreased function. The left ventricle demonstrates global hypokinesis. The left ventricular internal cavity size was mildly to moderately dilated. Left ventricular diastolic parameters are indeterminate.  2. Right ventricular systolic function is normal. The right ventricular size is normal.  3. The mitral valve is normal in structure. Trivial mitral  valve regurgitation. No evidence of mitral stenosis.  4. The aortic valve is tricuspid. There is mild calcification of the aortic valve. There is mild thickening of the aortic valve. Aortic valve regurgitation is not visualized. No aortic stenosis is present.  5. The inferior vena cava is normal in size with <50% respiratory variability, suggesting right atrial pressure of 8 mmHg. Comparison(s): Prior images reviewed side by side. Changes from prior study are noted. Visually EF appears slightly improved compared to prior study, though still abnormal. FINDINGS  Left Ventricle: Left ventricular ejection fraction, by estimation, is 30 to 35%. The left ventricle has moderately decreased function. The left ventricle demonstrates global hypokinesis. The left ventricular internal cavity size was mildly to moderately  dilated. There is no left ventricular hypertrophy. Left ventricular diastolic parameters are indeterminate. Right Ventricle: The right ventricular size is normal. Right vetricular wall thickness was not well visualized. Right ventricular systolic function is normal. Left Atrium: Left atrial size was normal in size. Right Atrium: Right atrial size was normal in size. Pericardium: There is no evidence of pericardial effusion. Mitral Valve: The mitral valve is normal in structure. Trivial mitral valve regurgitation. No evidence of mitral valve stenosis. Tricuspid Valve: The tricuspid valve is normal in structure. Tricuspid valve regurgitation is trivial. No evidence of tricuspid stenosis. Aortic Valve: The aortic valve is tricuspid. There is mild calcification of the aortic valve. There is mild thickening of the aortic valve. Aortic valve regurgitation is not visualized. No aortic stenosis is present. Pulmonic Valve: The pulmonic valve was grossly normal. Pulmonic valve regurgitation is not visualized. No evidence of pulmonic stenosis. Aorta: The aortic root, ascending aorta, aortic arch and descending aorta are  all structurally normal, with no evidence of dilitation or obstruction. Venous: The inferior vena cava is normal in size with less than 50% respiratory variability, suggesting right atrial pressure of 8 mmHg. IAS/Shunts: The atrial septum is grossly normal.  LEFT VENTRICLE PLAX 2D LVIDd:         5.70 cm      Diastology LVIDs:         4.60 cm      LV e' medial:    4.65 cm/s LV PW:  0.90 cm      LV E/e' medial:  9.1 LV IVS:        0.90 cm      LV e' lateral:   5.55 cm/s LVOT diam:     2.40 cm      LV E/e' lateral: 7.6 LV SV:         62 LV SV Index:   30 LVOT Area:     4.52 cm  LV Volumes (MOD) LV vol d, MOD A2C: 179.0 ml LV vol d, MOD A4C: 202.0 ml LV vol s, MOD A2C: 118.0 ml LV vol s, MOD A4C: 106.0 ml LV SV MOD A2C:     61.0 ml LV SV MOD A4C:     202.0 ml LV SV MOD BP:      82.1 ml RIGHT VENTRICLE RV S prime:     10.40 cm/s TAPSE (M-mode): 1.9 cm LEFT ATRIUM             Index       RIGHT ATRIUM           Index LA diam:        4.20 cm 2.04 cm/m  RA Area:     15.00 cm LA Vol (A2C):   42.8 ml 20.78 ml/m RA Volume:   38.80 ml  18.84 ml/m LA Vol (A4C):   37.2 ml 18.06 ml/m LA Biplane Vol: 39.8 ml 19.32 ml/m  AORTIC VALVE LVOT Vmax:   88.10 cm/s LVOT Vmean:  60.100 cm/s LVOT VTI:    0.137 m  AORTA Ao Root diam: 3.30 cm MITRAL VALVE MV Area (PHT): 4.46 cm    SHUNTS MV Decel Time: 170 msec    Systemic VTI:  0.14 m MR Peak grad: 95.6 mmHg    Systemic Diam: 2.40 cm MR Vmax:      489.00 cm/s MV E velocity: 42.20 cm/s MV A velocity: 68.60 cm/s MV E/A ratio:  0.62 Jodelle Red MD Electronically signed by Jodelle Red MD Signature Date/Time: 11/25/2020/4:03:30 PM    Final     Cardiac Studies     1. Left ventricular ejection fraction, by estimation, is 30 to 35%. The  left ventricle has moderately decreased function. The left ventricle  demonstrates global hypokinesis. The left ventricular internal cavity size  was mildly to moderately dilated.  Left ventricular diastolic parameters are  indeterminate.   2. Right ventricular systolic function is normal. The right ventricular  size is normal.   3. The mitral valve is normal in structure. Trivial mitral valve  regurgitation. No evidence of mitral stenosis.   4. The aortic valve is tricuspid. There is mild calcification of the  aortic valve. There is mild thickening of the aortic valve. Aortic valve  regurgitation is not visualized. No aortic stenosis is present.   5. The inferior vena cava is normal in size with <50% respiratory  variability, suggesting right atrial pressure of 8 mmHg.   Comparison(s): Prior images reviewed side by side. Changes from prior  study are noted. Visually EF appears slightly improved compared to prior  study, though still abnormal.  Just a little elevated  Patient Profile     60 y.o. male with acute on chronic systolic and diastolic HF. No clear etiology as of yet, presumed NICM. Prior cath patient notes with nonobstructive disease.  Assessment & Plan   Principal Problem:   HFrEF (heart failure with reduced ejection fraction) (HCC) Active Problems:   COPD (chronic obstructive pulmonary disease) (HCC)   Essential  hypertension   Acute on chronic combined systolic and diastolic CHF (congestive heart failure) (HCC)   Ischemic chest pain (HCC)   Tobacco abuse   Aortic atherosclerosis (HCC)   Coronary artery calcification   Mixed hyperlipidemia     HFrEF - report of nonobstructive CAD however back with chest pain and HF decompensation. With RHC will perform LHC as well. Diuresing well. [facility] - on asa 81 mg daily   Cr: 0.89 >> 1.04 >>0.99 UOP yesterday: -2.45 L Weight: 95 >> 92 kg >>91.5 Net negative for admission: -1.3 L  Diuretic plan: continue lasix 80 mg IV BID today Heart Failure Therapy ACE-I/ARB/ARNI: lisinopril 2.5 mg  BB: carvedilol 3.125 mg BID MRA: spironolactone 25 mg daily SGLT2I:  farxiga 10 mg daily.    HTN - lisinopril, spironolactone, will start BB. BP  normal today.    CV risk stratification - abnormal lipid panel. Trig 429. LDL unable to calculate. Start statin.   HbA1C 5.8. on Farxiga for HF.   For questions or updates, please contact [facility] Please consult [email or web address] for contact info under        Signed, Parke Poisson, MD  11/26/2020, 10:46 AM

## 2020-11-29 ENCOUNTER — Telehealth: Payer: Self-pay | Admitting: General Practice

## 2020-11-29 ENCOUNTER — Telehealth: Payer: Self-pay | Admitting: Licensed Clinical Social Worker

## 2020-11-29 ENCOUNTER — Telehealth: Payer: Self-pay | Admitting: Pharmacist

## 2020-11-29 NOTE — Telephone Encounter (Signed)
   Cody Carney DOB: [birth year] MRN: [id number]   RIDER WAIVER AND RELEASE OF LIABILITY  For purposes of improving physical access to our facilities, [facility] is pleased to partner with third parties to provide [facility] patients or other authorized individuals the option of convenient, on-demand ground transportation services (the [employer]") through use of the technology service that enables users to request on-demand ground transportation from independent third-party providers.  By opting to use and accept these [employer], I, the undersigned, hereby agree on behalf of myself, and on behalf of any minor child using the [employer] for whom I am the parent or legal guardian, as follows:  1. Science writer provided to me are provided by independent third-party transportation providers who are not [employer] or employees and who are unaffiliated with [employer]. 2. [facility] is neither a transportation carrier nor a common or public carrier. 3. [facility] has no control over the quality or safety of the transportation that occurs as a result of the [employer]. 4. [facility] cannot guarantee that any third-party transportation provider will complete any arranged transportation service. 5. [facility] makes no representation, warranty, or guarantee regarding the reliability, timeliness, quality, safety, suitability, or availability of any of the Transport Services or that they will be error free. 6. I fully understand that traveling by vehicle involves risks and dangers of serious bodily injury, including permanent disability, paralysis, and death. I agree, on behalf of myself and on behalf of any minor child using the Transport Services for whom I am the parent or legal guardian, that the entire risk arising out of my use of the [employer] remains solely with me, to the maximum extent permitted under applicable law. 7. The [employer]  are provided "as is" and "as available." [facility] disclaims all representations and warranties, express, implied or statutory, not expressly set out in these terms, including the implied warranties of merchantability and fitness for a particular purpose. 8. I hereby waive and release [facility], its agents, employees, officers, directors, representatives, insurers, attorneys, assigns, successors, subsidiaries, and affiliates from any and all past, present, or future claims, demands, liabilities, actions, causes of action, or suits of any kind directly or indirectly arising from acceptance and use of the [employer]. 9. I further waive and release [place] and its affiliates from all present and future liability and responsibility for any injury or death to persons or damages to property caused by or related to the use of the [employer]. 10. I have read this Waiver and Release of Liability, and I understand the terms used in it and their legal significance. This Waiver is freely and voluntarily given with the understanding that my right (as well as the right of any minor child for whom I am the parent or legal guardian using the [employer]) to legal recourse against [place] in connection with the [employer] is knowingly surrendered in return for use of these services.   I attest that I read the consent document to Cody Carney, gave Cody Carney the opportunity to ask questions and answered the questions asked (if any). I affirm that Cody Carney then provided consent for he's participation in this program.     Cody Carney

## 2020-11-29 NOTE — Telephone Encounter (Signed)
LCSW received a call from pt this morning from [phone]. Call complicated by spotty service.   Pt shared that he had received a letter from [facility] and was confused by what it was asking. LCSW had pt read letter and number on it out. Was able to identify that letter was offering pt to be able to change his managed Medicaid plan if he wants until April. Pt shares at this time he would like to stay with the Medicaid plan he was enrolled in with [facility] Medicaid. He states he was told he would be assigned a PCP and I confirmed that yes he would be assigned a PCP but if he was not happy with them he could also request a change.   He also states he was told we had set him up a ride to the appointment on Thursday with [facility] clinic. I confirmed we could and that I would check on that. Pt has my number for any additional questions, he is aware he will meet with one of my colleagues on Thursday.  After speaking with pt I touched base with Jenna, LCSW at [facility] clinic. She shares that they did not arrange a ride, this Clinical research associate offered to do so. I arranged transportation and ride for appt was confirmed w/ Romeo Apple, Freight forwarder. Confirmation of this also sent to [place].   Octavio Graves, MSW, LCSW [facility] Navigation  [phone]

## 2020-11-29 NOTE — Telephone Encounter (Signed)
Patient called stating he has a headache and feels tired. Wants to know if he can take some of his medications at night. I advised that his carvedilol needs to be taken twice a day and his furosemide should be taken in the AM due to increased urination. He has been taking furosemide three times a day. I advised he is only to take once a day now, and it to only take an extra if he has gained >3lb. I confirmed he picked up the lower dose of carvedilol. Has the lisinopril- may take this at night. Daughter is picking up spironolactone today. Has K tablets and Farxiga- may take at night if he would like.  He knows he has [facility] Thurs. Will bring his x-wife.  He needs help picking a PCP. Got letter in mail from [facility] needing to pick one but doesn't understand. I gave him Rutherford Nail from Social work phone number since she has assisted him in the past.

## 2020-12-01 SURGERY — LEFT HEART CATH AND CORONARY ANGIOGRAPHY
Anesthesia: LOCAL

## 2020-12-02 ENCOUNTER — Encounter (HOSPITAL_COMMUNITY): Payer: Self-pay

## 2020-12-02 ENCOUNTER — Ambulatory Visit (HOSPITAL_COMMUNITY)
Admit: 2020-12-02 | Discharge: 2020-12-02 | Disposition: A | Discharge status: Home / Self Care | Payer: PRIVATE HEALTH INSURANCE | Source: Ambulatory Visit | Attending: Internal Medicine | Admitting: Internal Medicine

## 2020-12-02 ENCOUNTER — Other Ambulatory Visit: Payer: Self-pay

## 2020-12-02 VITALS — BP 142/90 | HR 87 | Wt 216.0 lb

## 2020-12-02 DIAGNOSIS — Z7982 Long term (current) use of aspirin: Secondary | ICD-10-CM | POA: Diagnosis not present

## 2020-12-02 DIAGNOSIS — I110 Hypertensive heart disease with heart failure: Secondary | ICD-10-CM | POA: Insufficient documentation

## 2020-12-02 DIAGNOSIS — I428 Other cardiomyopathies: Secondary | ICD-10-CM | POA: Diagnosis not present

## 2020-12-02 DIAGNOSIS — Z79899 Other long term (current) drug therapy: Secondary | ICD-10-CM | POA: Insufficient documentation

## 2020-12-02 DIAGNOSIS — F17210 Nicotine dependence, cigarettes, uncomplicated: Secondary | ICD-10-CM | POA: Insufficient documentation

## 2020-12-02 DIAGNOSIS — Z720 Tobacco use: Secondary | ICD-10-CM

## 2020-12-02 DIAGNOSIS — I5043 Acute on chronic combined systolic (congestive) and diastolic (congestive) heart failure: Secondary | ICD-10-CM | POA: Insufficient documentation

## 2020-12-02 DIAGNOSIS — Z7984 Long term (current) use of oral hypoglycemic drugs: Secondary | ICD-10-CM | POA: Diagnosis not present

## 2020-12-02 DIAGNOSIS — R0602 Shortness of breath: Secondary | ICD-10-CM | POA: Insufficient documentation

## 2020-12-02 MED ORDER — CARVEDILOL 3.125 MG PO TABS: 3.1250 mg | ORAL_TABLET | Freq: Every morning | ORAL | 3 refills | Status: DC

## 2020-12-02 MED ORDER — SPIRONOLACTONE 25 MG PO TABS
25.0000 mg | ORAL_TABLET | Freq: Every day | ORAL | 3 refills | Status: DC
Start: 2020-12-02 — End: 2021-11-04

## 2020-12-02 MED ORDER — LOSARTAN POTASSIUM 25 MG PO TABS: 25.0000 mg | ORAL_TABLET | Freq: Every day | ORAL | 3 refills | Status: DC

## 2020-12-02 NOTE — Patient Instructions (Addendum)
Stop Lisinopril  Start Losartan 25 mg Daily  Increase Furosemide to 80 mg Twice daily x2 days  Restart Spironolactone 25 mg Daily  Your physician recommends that you schedule a follow-up appointment in: 1 week

## 2020-12-02 NOTE — Progress Notes (Addendum)
[facility]  PCP: no PCP Primary Cardiologist: Riley Lam  HPI:  Cody Carney is a 60 y.o.  male  with a PMH significant for Combined Systolic and Diastolic CHF, ?COPD, hypertension and tobacco abuse  [facility], chest pain and dyspnea.  Presentation was not consistent with ACS and felt to be more of acute chf exacerbation/COPD and he reported normal coronary arteries from [facility] done in 2020 in [place] so he received no cath.  He did have an ECHO which showed EF 25-30% normal RV function, G1DD trivial valve disease.  He was diuresed, treated for COPD, discharged on Carvedilol and Losartan.    Followed by Dr. Izora Ribas since that admission but having a difficult time starting in January out of some medications due to lack of insurance,  significant weight gain, trying to adjust diuretic therapy and medication regimen.    Ultimately he required readmission for CHF symptoms on 11/24/20. He was diuresed briefly with improvement in dyspnea weight down about 6lbs.  Discharged at about 201lbs.  He had repeat ECHO which had a slight improvement in EF 30-35%.  He had a LHC with normal coronaries and RHC with normal heart pressures.    Says he never picked up spiro or atorvastatin, He says he has gained around 15lbs and has been eating much less since leaving the hospital.  Never took extra lasix dose because not weighing himself at home, does not have a scale.  Increased dyspnea since discharge mainly yesterday.    Heavy drinker most of his life drinking about 12 pack of beer per day and more on the weekends.  Did try some drugs in the past but never consistent use.  Has been cutting back his alcohol use for last 2 years.  Now he rarely drinks. Has cut back smoking to about 4 cigarettes per day, using nicotene patches.  No CHF in family history.    ROS: All systems negative except as listed in HPI, PMH and  Problem List.  SH:  Social History   Socioeconomic History  . Marital status: Single    Spouse name: Not on file  . Number of children: Not on file  . Years of education: Not on file  . Highest education level: Not on file  Occupational History  . Not on file  Tobacco Use  . Smoking status: Current Every Day Smoker    Packs/day: 0.25    Years: 39.00    Pack years: 9.75    Types: Cigarettes  . Smokeless tobacco: Never Used  . Tobacco comment: encouraged smoking cessation, offered nicotine patch upon DC--relayed msg to primary team  Vaping Use  . Vaping Use: Never used  Substance and Sexual Activity  . Alcohol use: Yes    Alcohol/week: 2.0 standard drinks    Types: 2 Cans of beer per week    Comment: per pt statement 2 cans beer on weekend with friends occassionally  . Drug use: Not Currently    Types: Marijuana    Comment: Hasn't smoked in a "long time"  . Sexual activity: Not Currently  Other Topics Concern  . Not on file  Social History Narrative  . Not on file   Social Determinants of Health   Financial Resource Strain: High Risk  . Difficulty of Paying Living Expenses: Very hard  Food Insecurity: No Food Insecurity  . Worried About Programme researcher, broadcasting/film/video in the Last Year: Never true  .  Ran Out of Food in the Last Year: Never true  Transportation Needs: Unmet Transportation Needs  . Lack of Transportation (Medical): No  . Lack of Transportation (Non-Medical): Yes  Physical Activity: Inactive  . Days of Exercise per Week: 0 days  . Minutes of Exercise per Session: 0 min  Stress: Not on file  Social Connections: Not on file  Intimate Partner Violence: Not on file    FH: No family history on file.  Past Medical History:  Diagnosis Date  . CHF (congestive heart failure) (HCC)   . COPD (chronic obstructive pulmonary disease) (HCC)   . Hypertension     Current Outpatient Medications  Medication Sig Dispense Refill  . acetaminophen (TYLENOL) 500 MG tablet  Take 1,000 mg by mouth every 6 (six) hours as needed for mild pain.    Marland Kitchen aspirin 81 MG EC tablet Take 81 mg by mouth daily.    . carvedilol (COREG) 3.125 MG tablet Take 1 tablet (3.125 mg total) by mouth in the morning and at bedtime. 60 tablet 3  . dapagliflozin propanediol (FARXIGA) 10 MG TABS tablet Take 1 tablet (10 mg total) by mouth daily. 30 tablet 3  . furosemide (LASIX) 80 MG tablet Take 1 tablet (80 mg total) by mouth daily. Take extra 80mg  for 3lb weight gain 40 tablet 3  . lisinopril (ZESTRIL) 2.5 MG tablet Take 1 tablet (2.5 mg total) by mouth daily. 30 tablet 3  . nicotine (NICODERM CQ - DOSED IN MG/24 HOURS) 21 mg/24hr patch Place 1 patch (21 mg total) onto the skin daily. 28 patch 0  . potassium chloride (KLOR-CON) 10 MEQ tablet Take 1 tablet (10 mEq total) by mouth daily. 30 tablet 3   Current Facility-Administered Medications  Medication Dose Route Frequency Provider Last Rate Last Admin  . acetaminophen (TYLENOL) tablet 650 mg  650 mg Oral Q4H PRN Chandrasekhar, Mahesh A, MD      . ondansetron (ZOFRAN) 4 mg in sodium chloride 0.9 % 50 mL IVPB  4 mg Intravenous Q6H PRN Chandrasekhar, Mahesh A, MD        Vitals:   12/02/20 1448  BP: (!) 142/90  Pulse: 87  SpO2: 99%  Weight: 98 kg (216 lb)    PHYSICAL EXAM: Cardiac: JVD 7, normal rate and rhythm, clear s1 and s2, no murmurs, rubs or gallops, no LE edema Pulmonary: CTAB, not in distress Abdominal: distended abdomen but, soft and nontender Psych: Alert, conversant, in good spirits    ASSESSMENT & PLAN: Acute on Chronic Combined Systolic and Diastolic CHF, NICM: -ECHO 08/2020 which showed EF 25-30% normal RV function, G1DD trivial valve disease -NYHA Class II symptoms -ECHO 11/2020  EF 30-35%, also had a LHC with normal coronaries and RHC with normal heart pressures -?heavy alcohol use heart is dilated, never really on meds consistently due to affordability and poor understandin of medication necessity -Currently  taking farxiga, lisinopril, carvedilol, did not pick up atorvastatin or spiro he gets very confused with medication changes especially if they are complex  -restart spiro 25mg , switch lisinopril to losartan 25mg (with goal of transitioning to [place])  -15lb weight gain despite eating poorly but surprisingly doesn't appear overloaded although abdomen is distended and he feels mildly more short of breath with exertion. -Cautiously increase lasix from 80mg  daily to BID for two days this combined with spiro should be fine, given scale and pillbox -I don't see a clear indication for aspirin we will stop this next visit -arranged close follow up with  for one week  Tobacco Use disorder: -doing well on nicotene patches he will work to cut back completely  SDOH: Medication education and assistance provided, Social work will work on getting the patient a PCP, financial assessment    Follow up with [facility] or me in one week

## 2020-12-02 NOTE — Progress Notes (Signed)
[facility]  Heart Failure Pharmacist Encounter  HPI:   60 yo M with CHF, COPD, HTN, and tobacco abuse. He presented to the ED on 08/08/20 with chest pain and difficulty breathing. An ECHO was done on 08/09/20 and his LVEF is reduced to 25-30%. He was discharged on 08/10/20. He was readmitted from 11/24/20 to 11/26/20 for worsening CHF symptoms. He was diuresed with slight improvement shown in EF (30-35%).   Today, Cody Carney presents to the [facility] for follow up. He reports his shortness of breath has improved significantly since hospitalization. Denies orthopnea or edema. Reports mild dizziness. He does not complete daily weights at home - does not have a scale. A scale has been provided today. He does have a BP cuff at home, however, has not been using it. Low-sodium diet is improving. He is trying to be more aware of sodium containment in foods. He has thrown away all salt shakers and is trying to cut out soda. He was provided a pill-box today.   HF Medications: Carvedilol 3.125 mg BID Farxiga 10 mg once daily Furosemide 80 mg once daily Lisinopril 2.5 mg once daily Spironolactone 25 mg once daily (not started yet)  Has the patient been experiencing any side effects to the medications prescribed?  Yes - express significant fatigue (likely due to carvedilol)  Does the patient have any problems obtaining medications due to transportation or finances?   no  Understanding of regimen: good Understanding of indications: good Potential of compliance: good Patient understands to avoid NSAIDs. Patient understands to avoid decongestants.   Pertinent Lab Values: . Serum creatinine 0.99, BUN 21, Potassium 3.4, Sodium 135, BNP 86.2  Vital Signs: . Weight: 98 kg (discharge weight: 91.5 kg) . Blood pressure: 142/90  . Heart rate: 87  Medication Assistance / Insurance Benefits Check: Does the patient have prescription insurance?  Yes Type of insurance plan:  Medicaid  Outpatient Pharmacy:  Current outpatient pharmacy: [facility] -- patient expresses that he plans to change to [facility]  Was the [facility] pharmacy used to supply discharge medications? yes  If TOC pharmacy was used, were the refills transferred out to current pharmacy yet? no - will message [facility] pharmacist to initiated and complete these transfers Is the patient willing to transition their outpatient pharmacy to utilize a [facility] outpatient pharmacy with or without mail order?   No  Assessment: 1) Chronic systolic CHF (EF 94-17%). NYHA class II symptoms. - Despite lack of edema presentation, patient has had significantly weight gain (16 lbs). Patient instructed to double lasix dose over next 2 days, then transition back to once daily dosing  - Switch lisinopril to losartan 25 mg - BP is permitting and starting an ARB will allow for an easier transition to Entresto at next follow up  - Continue carvedilol; patient experiencing significant fatigue, Dr. Frances Furbish advised to hold morning dose for 1 week, then transition back to BID dosing. Can consider titrating dose if needed in future - Continue spironolactone, patient not yet started since d/c. BP elevated today, spironolactone will allow for great BP lowering and potential diuretic benefits - Continue farxiga 10 mg once daily   Plan: 1) Medication changes: - Switch lisinopril to losartan 25 mg once daily - Double furosemide dose x 2 days, then transitions back to 80 mg once daily - Hold morning carvedilol dose x 1 week, then transition back to BID   2) Patient Assistance: - Patient has [name], copays for all medications will  be $0-$3 per medication/month  3) Follow up: - Next appointment with [facility] on 12/09/2020  Theodis Sato, PharmD PGY-1 Community Pharmacy Resident  12/02/2020 4:57 PM  Sharen Hones, PharmD, BCPS Heart Failure [facility] Pharmacist [phone]

## 2020-12-09 ENCOUNTER — Ambulatory Visit (HOSPITAL_COMMUNITY): Payer: PRIVATE HEALTH INSURANCE

## 2020-12-10 ENCOUNTER — Ambulatory Visit: Payer: PRIVATE HEALTH INSURANCE | Admitting: Internal Medicine

## 2020-12-10 ENCOUNTER — Telehealth (HOSPITAL_COMMUNITY): Payer: Self-pay

## 2020-12-10 NOTE — Telephone Encounter (Signed)
Heart Failure Patient Advocate Encounter   Received notification from [facility] that prior authorization for Sherryll Burger is required.   PA submitted on CoverMyMeds PA # [id number] Status is denied.  Patient was scheduled for HF [facility] clinic on 12/09/20 and Entresto PA was completed in anticipation for visit. Since he did no show for this visit, will hold off submitting an expedited appeal at this time since this medication has not been started yet.  If Sherryll Burger is initiated, appeal will need to be completed and faxed to [phone] (phone number for questions [phone]) within 60 days of 12/10/20.   Sharen Hones, PharmD, BCPS Heart Failure Stewardship Pharmacist Phone [phone]

## 2020-12-10 NOTE — Progress Notes (Deleted)
Cardiology Office Note:    Date:  12/10/2020   ID:  Cody Carney, DOB Sep 09, 1961, MRN [id number]  PCP:  Patient, [name]  [facility] Cardiologist:  Werner Lean, MD  [place] Electrophysiologist:  None   CC: Follow up HF decompensation admission  History of Present Illness:    Derrell Carney is a 60 y.o. male with a hx of HTN, Aortic Atherosclerosis with Coronary Artery Calcifications, COPD and Tobacco Abuse with lymph node seen on CT who presented 08/2020 with decompensated HF.  In interim of this visit, continued to uptitrate GDMT, and patient had increase in his lasix to attempt to hold of admission.  Needed admission  11/24/20; had improvement of LVEF and diuresis.  Saw [place] 2/24 with transition to lasix 80 mg BID, restart of aldactone and transition from losartan 25 mg.  Patient notes that he is doing ***.  Since day prior/last visit notes *** changes.  Relevant interval testing or therapy include ***.  There are no*** interval hospital/ED visit.    No chest pain or pressure ***.  No SOB/DOE*** and no PND/Orthopnea***.  No weight gain or leg swelling***.  No palpitations or syncope ***.  Ambulatory blood pressure ***.    Past Medical History:  Diagnosis Date  . CHF (congestive heart failure) ([place])   . COPD (chronic obstructive pulmonary disease) ([place])   . Hypertension     Past Surgical History:  Procedure Laterality Date  . LEG SURGERY    . RIGHT/LEFT HEART CATH AND CORONARY ANGIOGRAPHY N/A 11/26/2020   Procedure: RIGHT/LEFT HEART CATH AND CORONARY ANGIOGRAPHY;  Surgeon: Troy Sine, MD;  Location: [place] [facility];  Service: Cardiovascular;  Laterality: N/A;    Current Medications: No outpatient medications have been marked as taking for the 12/10/20 encounter (Appointment) with Werner Lean, MD.   Current Facility-Administered Medications for the 12/10/20 encounter (Appointment) with Werner Lean, MD  Medication  . acetaminophen  (TYLENOL) tablet 650 mg  . ondansetron (ZOFRAN) 4 mg in sodium chloride 0.9 % 50 mL IVPB     Allergies:   Patient has no known allergies.   Social History   Socioeconomic History  . Marital status: Single    Spouse name: Not on file  . Number of children: Not on file  . Years of education: Not on file  . Highest education level: Not on file  Occupational History  . Not on file  Tobacco Use  . Smoking status: Current Every Day Smoker    Packs/day: 0.25    Years: 39.00    Pack years: 9.75    Types: Cigarettes  . Smokeless tobacco: Never Used  . Tobacco comment: encouraged smoking cessation, offered nicotine patch upon DC--relayed msg to primary team  Vaping Use  . Vaping Use: Never used  Substance and Sexual Activity  . Alcohol use: Yes    Alcohol/week: 2.0 standard drinks    Types: 2 Cans of beer per week    Comment: per pt statement 2 cans beer on weekend with friends occassionally  . Drug use: Not Currently    Types: Marijuana    Comment: Hasn't smoked in a "long time"  . Sexual activity: Not Currently  Other Topics Concern  . Not on file  Social History Narrative  . Not on file   Social Determinants of Health   Financial Resource Strain: High Risk  . Difficulty of Paying Living Expenses: Very hard  Food Insecurity: No Food Insecurity  . Worried  About Running Out of Food in the Last Year: Never true  . Ran Out of Food in the Last Year: Never true  Transportation Needs: Unmet Transportation Needs  . Lack of Transportation (Medical): No  . Lack of Transportation (Non-Medical): Yes  Physical Activity: Inactive  . Days of Exercise per Week: 0 days  . 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 History of heart failure notable for no  members. History of arrhythmia notable for uncle needing pacemaker. No history of bicuspid aortic valve or aortic aneurysm or dissection.  ROS:   Please see the history of present illness.    Bilateral knee and back pain All other systems reviewed and are negative.  EKGs/Labs/Other Studies Reviewed:    The following studies were reviewed today:  EKG:  08/09/20:  Sinus tachycardia rate 113 bpm  Transthoracic Echocardiogram: Date:08/09/20 Results: IEcho 11/26/2019 Left ventricular ejection fraction, by estimation, is 30 to 35%. The  left ventricle has moderately decreased function. The left ventricle  demonstrates global hypokinesis. The left ventricular internal cavity size  was mildly to moderately dilated.  Left ventricular diastolic parameters are indeterminate.  2. Right ventricular systolic function is normal. The right ventricular  size is normal.  3. The mitral valve is normal in structure. Trivial mitral valve  regurgitation. No evidence of mitral stenosis.  4. The aortic valve is tricuspid. There is mild calcification of the  aortic valve. There is mild thickening of the aortic valve. Aortic valve  regurgitation is not visualized. No aortic stenosis is present.  5. The inferior vena cava is normal in size with <50% respiratory  variability, suggesting right atrial pressure of 8 mmHg.   NonCardiac CT: Date: 08/08/2020 Results: Stable mediastinal lymph node; aortic atherosclerosis, CAC  Left/Right Heart Catheterizations: Date: 12/06/20 Normal coronary arteries with a dominant left circumflex system.  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  RECOMMENDATION: Guideline directed medical therapy for HFrEF.  Smoking cessation.   Recent Labs: 10/22/2020: Magnesium 1.8 11/24/2020: B Natriuretic Peptide 86.2 11/26/2020: BUN 21; Creatinine, Ser 0.99; Hemoglobin 15.6; Platelets 117; Potassium 3.9; Sodium 137   Recent Lipid Panel    Component Value Date/Time   CHOL 251 (H) 11/26/2020 0027   TRIG 429 (H) 11/26/2020 0027   HDL 33 (L) 11/26/2020 0027   CHOLHDL 7.6 11/26/2020 0027   VLDL UNABLE TO CALCULATE IF TRIGLYCERIDE OVER 400 mg/dL 11/26/2020 0027   LDLCALC UNABLE TO CALCULATE IF TRIGLYCERIDE OVER 400 mg/dL 11/26/2020 0027   LDLDIRECT 155.3 (H) 11/26/2020 0027     Risk Assessment/Calculations:     N/A  Physical Exam:    VS:  There were no vitals taken for this visit.    Wt Readings from Last 3 Encounters:  12/02/20 216 lb (98 kg)  11/26/20 201 lb 11.5 oz (91.5 kg)  11/24/20 210 lb (95.3 kg)    GEN: Well nourished, well developed in no acute distress HEENT: Normal NECK: No JVD; No carotid bruits LYMPHATICS: No lymphadenopathy CARDIAC: RRR, no murmurs, rubs, gallops RESPIRATORY:  Clear to auscultation without rales, wheezing or rhonchi  ABDOMEN: Soft, non-tender, non-distended MUSCULOSKELETAL:  Notable abdominal wall edema; No deformity  SKIN: Warm and dry NEUROLOGIC:  Alert and oriented x 3  PSYCHIATRIC:  Normal affect   ASSESSMENT:    No diagnosis found. PLAN:    In order of problems listed above:   Heart Failure reduced Ejection Fraction  - NYHA class III, Stage C, hypervolemic, etiology from unknown cause, - Diuretic regimen: will increase lasix to 80 mg PO BID *** - Discussed the importance of fluid restriction of < 2 L, salt restriction, and checking daily weights  - BMP/BNP/Mg in 7-10 days *** - Coreg 25 mg PO BID (refill)*** - Continue losartan (refill) *** - start aldactone 12.5 mg PO Daily; *** - Device Indications: has had intermittent GDMT; will plan to reasess echo late 2022 for this discussion - Clinical Trials: on a SGLTi inhibitor vs placebo trial *** - Indication for AHF: long term could be appropriate  Aortic Atherosclerosis Coronary calcification HLD (mixed with triglyceridemia) - atorvastatin to 40 mg PO daily  Daytime Somnolence - Epworth  greater than 18 - Will need Sleep Study  Tobacco Abuse: *** - discussed the dangers of tobacco use, both inhaled and oral, which include, but are not limited to cardiovascular disease, increased cancer risk of multiple types of cancer, COPD, peripheral arterial disease, strokes. - counseled on the benefits of smoking cessation. - firmly advised to quit.  - we also reviewed strategies to maximize success, including:  Removing cigarettes and smoking materials from environment   Stress management  Substitution of other forms of reinforcement (the one cigarette a day approach)  Support of family/friends and group smoking cessation  Selecting a quit date  Patient provided contact information for [facility]uit[facility] or 1-800-QUIT-NOW  Patient provided with [place] [facility] 8 free smoking cessation classes: (336) [phone] and [email or web address]    Would be reasonable for  APP Follow up    Medication Adjustments/Labs and Tests Ordered: Current medicines are reviewed at length with the patient today.  Concerns regarding medicines are outlined above.  No orders of the defined types were placed in this encounter.  No orders of the defined types were placed in this encounter.   There are no Patient Instructions on file for this visit.   Signed, Werner Lean, MD  12/10/2020 12:51 PM    [facility]

## 2020-12-23 ENCOUNTER — Ambulatory Visit (HOSPITAL_COMMUNITY): Payer: PRIVATE HEALTH INSURANCE

## 2020-12-28 ENCOUNTER — Ambulatory Visit (HOSPITAL_COMMUNITY): Payer: PRIVATE HEALTH INSURANCE

## 2020-12-29 ENCOUNTER — Telehealth (HOSPITAL_COMMUNITY): Payer: Self-pay

## 2020-12-29 ENCOUNTER — Ambulatory Visit: Payer: PRIVATE HEALTH INSURANCE | Admitting: Internal Medicine

## 2020-12-29 NOTE — Telephone Encounter (Signed)
Called to cancel HV TOC appt 3/30 as pt has already seen Dr. Edwyna Perfect with cardiology. Duplicate visit, no need for HV TOC at this time. Pt agrees, appt canceled. Gave next appt information for Dr. Edwyna Perfect of April 7.   Ozella Rocks, RN, BSN Heart Failure Nurse Navigator [phone]

## 2020-12-29 NOTE — Progress Notes (Deleted)
Cardiology Office Note:    Date:  12/29/2020   ID:  Cody Carney, DOB August 04, 1961, MRN [id number]  PCP:  Patient, [name]  [facility] Cardiologist:  Werner Lean, MD  [place] Electrophysiologist:  None   CC: Follow up HF decompensation admission  History of Present Illness:    Cody Carney is a 60 y.o. male with a hx of HTN, Aortic Atherosclerosis with Coronary Artery Calcifications, COPD and Tobacco Abuse with lymph node seen on CT who presented 08/2020 with decompensated HF.  In interim of this visit, continued to uptitrate GDMT, and patient had increase in his lasix to attempt to hold of admission.  Needed admission  11/24/20; had improvement of LVEF and diuresis.  Saw [place] 2/24 with transition to lasix 80 mg BID, restart of aldactone and transition from losartan 25 mg.  Patient notes that he is doing ***.  Since day prior/last visit notes *** changes.  Relevant interval testing or therapy include ***.  There are no*** interval hospital/ED visit.    No chest pain or pressure ***.  No SOB/DOE*** and no PND/Orthopnea***.  No weight gain or leg swelling***.  No palpitations or syncope ***.  Ambulatory blood pressure ***.    Past Medical History:  Diagnosis Date  . CHF (congestive heart failure) ([place])   . COPD (chronic obstructive pulmonary disease) ([place])   . Hypertension     Past Surgical History:  Procedure Laterality Date  . LEG SURGERY    . RIGHT/LEFT HEART CATH AND CORONARY ANGIOGRAPHY N/A 11/26/2020   Procedure: RIGHT/LEFT HEART CATH AND CORONARY ANGIOGRAPHY;  Surgeon: Troy Sine, MD;  Location: [place] [facility];  Service: Cardiovascular;  Laterality: N/A;    Current Medications: No outpatient medications have been marked as taking for the 12/29/20 encounter (Appointment) with Werner Lean, MD.   Current Facility-Administered Medications for the 12/29/20 encounter (Appointment) with Werner Lean, MD  Medication  .  acetaminophen (TYLENOL) tablet 650 mg  . ondansetron (ZOFRAN) 4 mg in sodium chloride 0.9 % 50 mL IVPB     Allergies:   Patient has no allergy information on record.   Social History   Socioeconomic History  . Marital status: Single    Spouse name: Not on file  . Number of children: Not on file  . Years of education: Not on file  . Highest education level: Not on file  Occupational History  . Not on file  Tobacco Use  . Smoking status: Current Every Day Smoker    Packs/day: 0.25    Years: 39.00    Pack years: 9.75    Types: Cigarettes  . Smokeless tobacco: Never Used  . Tobacco comment: encouraged smoking cessation, offered nicotine patch upon DC--relayed msg to primary team  Vaping Use  . Vaping Use: Never used  Substance and Sexual Activity  . Alcohol use: Yes    Alcohol/week: 2.0 standard drinks    Types: 2 Cans of beer per week    Comment: per pt statement 2 cans beer on weekend with friends occassionally  . Drug use: Not Currently    Types: Marijuana    Comment: Hasn't smoked in a "long time"  . Sexual activity: Not Currently  Other Topics Concern  . Not on file  Social History Narrative  . Not on file   Social Determinants of Health   Financial Resource Strain: High Risk  . Difficulty of Paying Living Expenses: Very hard  Food Insecurity: No Food Insecurity  .  Worried About Charity fundraiser in the Last Year: Never true  . Ran Out of Food in the Last Year: Never true  Transportation Needs: Unmet Transportation Needs  . Lack of Transportation (Medical): No  . Lack of Transportation (Non-Medical): Yes  Physical Activity: Inactive  . Days of Exercise per Week: 0 days  . 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 History of  heart failure notable for no members. History of arrhythmia notable for uncle needing pacemaker. No history of bicuspid aortic valve or aortic aneurysm or dissection.  ROS:   Please see the history of present illness.    Bilateral knee and back pain All other systems reviewed and are negative.  EKGs/Labs/Other Studies Reviewed:    The following studies were reviewed today:  EKG:  08/09/20:  Sinus tachycardia rate 113 bpm  Transthoracic Echocardiogram: Date:08/09/20 Results: IEcho 11/26/2019 Left ventricular ejection fraction, by estimation, is 30 to 35%. The  left ventricle has moderately decreased function. The left ventricle  demonstrates global hypokinesis. The left ventricular internal cavity size  was mildly to moderately dilated.  Left ventricular diastolic parameters are indeterminate.  2. Right ventricular systolic function is normal. The right ventricular  size is normal.  3. The mitral valve is normal in structure. Trivial mitral valve  regurgitation. No evidence of mitral stenosis.  4. The aortic valve is tricuspid. There is mild calcification of the  aortic valve. There is mild thickening of the aortic valve. Aortic valve  regurgitation is not visualized. No aortic stenosis is present.  5. The inferior vena cava is normal in size with <50% respiratory  variability, suggesting right atrial pressure of 8 mmHg.   NonCardiac CT: Date: 08/08/2020 Results: Stable mediastinal lymph node; aortic atherosclerosis, CAC  Left/Right Heart Catheterizations: Date: 12/06/20 Normal coronary arteries with a dominant left circumflex system.  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  RECOMMENDATION: Guideline directed medical therapy for HFrEF.  Smoking cessation.   Recent Labs: 10/22/2020: Magnesium 1.8 11/24/2020: B Natriuretic Peptide 86.2 11/26/2020: BUN 21; Creatinine, Ser 0.99; Hemoglobin 15.6; Platelets 117;  Potassium 3.9; Sodium 137  Recent Lipid Panel    Component Value Date/Time   CHOL 251 (H) 11/26/2020 0027   TRIG 429 (H) 11/26/2020 0027   HDL 33 (L) 11/26/2020 0027   CHOLHDL 7.6 11/26/2020 0027   VLDL UNABLE TO CALCULATE IF TRIGLYCERIDE OVER 400 mg/dL 11/26/2020 0027   LDLCALC UNABLE TO CALCULATE IF TRIGLYCERIDE OVER 400 mg/dL 11/26/2020 0027   LDLDIRECT 155.3 (H) 11/26/2020 0027     Risk Assessment/Calculations:     N/A  Physical Exam:    VS:  There were no vitals taken for this visit.    Wt Readings from Last 3 Encounters:  12/02/20 216 lb (98 kg)  11/26/20 201 lb 11.5 oz (91.5 kg)  11/24/20 210 lb (95.3 kg)    GEN: Well nourished, well developed in no acute distress HEENT: Normal NECK: No JVD; No carotid bruits LYMPHATICS: No lymphadenopathy CARDIAC: RRR, no murmurs, rubs, gallops RESPIRATORY:  Clear to auscultation without rales, wheezing or rhonchi  ABDOMEN: Soft, non-tender, non-distended MUSCULOSKELETAL:  Notable abdominal wall edema; No deformity  SKIN: Warm and dry NEUROLOGIC:  Alert and oriented x  3 PSYCHIATRIC:  Normal affect   ASSESSMENT:    No diagnosis found. PLAN:    In order of problems listed above:   Heart Failure reduced Ejection Fraction  - NYHA class III, Stage C, hypervolemic, etiology from unknown cause, - Diuretic regimen: will increase lasix to 80 mg PO BID *** - Discussed the importance of fluid restriction of < 2 L, salt restriction, and checking daily weights  - BMP/BNP/Mg in 7-10 days *** - Coreg 25 mg PO BID (refill)*** - Continue losartan (refill) *** - start aldactone 12.5 mg PO Daily; *** - Device Indications: has had intermittent GDMT; will plan to reasess echo late 2022 for this discussion - Clinical Trials: on a SGLTi inhibitor vs placebo trial *** - Indication for AHF: long term could be appropriate  Aortic Atherosclerosis Coronary calcification HLD (mixed with triglyceridemia) - atorvastatin to 40 mg PO  daily  Daytime Somnolence - Epworth greater than 18 - Will need Sleep Study  Tobacco Abuse: *** - discussed the dangers of tobacco use, both inhaled and oral, which include, but are not limited to cardiovascular disease, increased cancer risk of multiple types of cancer, COPD, peripheral arterial disease, strokes. - counseled on the benefits of smoking cessation. - firmly advised to quit.  - we also reviewed strategies to maximize success, including:  Removing cigarettes and smoking materials from environment   Stress management  Substitution of other forms of reinforcement (the one cigarette a day approach)  Support of family/friends and group smoking cessation  Selecting a quit date  Patient provided contact information for [facility]uitline[place] or 1-800-QUIT-NOW  Patient provided with [place]'s 8 free smoking cessation classes: (336) ([phone] [phone] and [email or web address]    Would be reasonable for  APP Follow up    Medication Adjustments/Labs and Tests Ordered: Current medicines are reviewed at length with the patient today.  Concerns regarding medicines are outlined above.  No orders of the defined types were placed in this encounter.  No orders of the defined types were placed in this encounter.   There are no Patient Instructions on file for this visit.   Signed, Werner Lean, MD  12/29/2020 7:27 AM    [facility]

## 2021-01-05 ENCOUNTER — Ambulatory Visit (HOSPITAL_COMMUNITY): Payer: PRIVATE HEALTH INSURANCE

## 2021-01-13 ENCOUNTER — Telehealth: Payer: Self-pay | Admitting: Internal Medicine

## 2021-01-13 ENCOUNTER — Ambulatory Visit (INDEPENDENT_AMBULATORY_CARE_PROVIDER_SITE_OTHER): Payer: PRIVATE HEALTH INSURANCE | Admitting: Internal Medicine

## 2021-01-13 ENCOUNTER — Other Ambulatory Visit: Payer: Self-pay

## 2021-01-13 ENCOUNTER — Encounter: Payer: Self-pay | Admitting: Internal Medicine

## 2021-01-13 VITALS — BP 130/80 | HR 93 | Wt 202.0 lb

## 2021-01-13 DIAGNOSIS — I700 Atherosclerosis of aorta: Secondary | ICD-10-CM

## 2021-01-13 DIAGNOSIS — I2510 Atherosclerotic heart disease of native coronary artery without angina pectoris: Secondary | ICD-10-CM

## 2021-01-13 DIAGNOSIS — I5020 Unspecified systolic (congestive) heart failure: Secondary | ICD-10-CM | POA: Diagnosis not present

## 2021-01-13 DIAGNOSIS — Z720 Tobacco use: Secondary | ICD-10-CM

## 2021-01-13 DIAGNOSIS — I2584 Coronary atherosclerosis due to calcified coronary lesion: Secondary | ICD-10-CM

## 2021-01-13 MED ORDER — SACUBITRIL-VALSARTAN 24-26 MG PO TABS: 1.0000 | ORAL_TABLET | Freq: Two times a day (BID) | ORAL | 3 refills | Status: DC

## 2021-01-13 NOTE — Progress Notes (Signed)
Cardiology Office Note:    Date:  01/13/2021   ID:  [name], DOB 02/28/1961, MRN [id number]  PCP:  Patient, [name] (Inactive)  [facility] Cardiologist:  Werner Lean, MD  [place] Electrophysiologist:  None   CC: Follow up HF decompensation admission  History of Present Illness:    Cody Carney is a 60 y.o. male with a hx of HTN, Aortic Atherosclerosis with Coronary Artery Calcifications, COPD and Tobacco Abuse with lymph node seen on CT who presented 08/2020 with decompensated HF.  In interim of this visit, continued to uptitrate GDMT, and patient had increase in his lasix to attempt to hold of admission.  Needed admission  11/24/20; had improvement of LVEF and diuresis.  Saw Becker 2/24 with transition to lasix 80 mg BID, restart of aldactone and transition from losartan 25 mg.  Patient notes that he is doing not much of anything.  Since last visit notes no changes.  There are no interval hospital/ED visit.    No chest pain or pressure.  No SOB/DOE and no PND/Orthopnea.  Notes weight loss but with knee surgery.  No palpitations or syncope . Notes sleeping issues and back pain, but no sleeping upright.   Past Medical History:  Diagnosis Date  . CHF (congestive heart failure) ([place])   . COPD (chronic obstructive pulmonary disease) ([place])   . Hypertension     Past Surgical History:  Procedure Laterality Date  . LEG SURGERY    . RIGHT/LEFT HEART CATH AND CORONARY ANGIOGRAPHY N/A 11/26/2020   Procedure: RIGHT/LEFT HEART CATH AND CORONARY ANGIOGRAPHY;  Surgeon: Troy Sine, MD;  Location: [facility];  Service: Cardiovascular;  Laterality: N/A;    Current Medications: Current Meds  Medication Sig  . acetaminophen (TYLENOL) 500 MG tablet Take 1,000 mg by mouth every 6 (six) hours as needed for mild pain.  Marland Kitchen aspirin 81 MG EC tablet Take 81 mg by mouth daily.  . carvedilol (COREG) 3.125 MG tablet Take 1 tablet (3.125 mg total) by mouth in the morning.  .  dapagliflozin propanediol (FARXIGA) 10 MG TABS tablet TAKE 1 TABLET (10 MG TOTAL) BY MOUTH DAILY.  . furosemide (LASIX) 80 MG tablet TAKE 1 TABLET (80 MG TOTAL) BY MOUTH DAILY. TAKE EXTRA 80MG FOR 3LB WEIGHT GAIN  . losartan (COZAAR) 25 MG tablet Take 1 tablet (25 mg total) by mouth daily.  . nicotine (NICODERM CQ - DOSED IN MG/24 HOURS) 21 mg/24hr patch PLACE 1 PATCH (21 MG TOTAL) ONTO THE SKIN DAILY.  Marland Kitchen potassium chloride (KLOR-CON) 10 MEQ tablet TAKE 1 TABLET (10 MEQ TOTAL) BY MOUTH DAILY.  . sacubitril-valsartan (ENTRESTO) 24-26 MG Take 1 tablet by mouth 2 (two) times daily.  Marland Kitchen spironolactone (ALDACTONE) 25 MG tablet Take 1 tablet (25 mg total) by mouth daily.  . [DISCONTINUED] lisinopril (ZESTRIL) 2.5 MG tablet Take 1 tablet (2.5 mg total) by mouth daily.   Current Facility-Administered Medications for the 01/13/21 encounter (Office Visit) with Werner Lean, MD  Medication  . acetaminophen (TYLENOL) tablet 650 mg  . ondansetron (ZOFRAN) 4 mg in sodium chloride 0.9 % 50 mL IVPB     Allergies:   Patient has no allergy information on record.   Social History   Socioeconomic History  . Marital status: Single    Spouse name: Not on file  . Number of children: Not on file  . Years of education: Not on file  . Highest education level: Not on file  Occupational History  .  Not on file  Tobacco Use  . Smoking status: Current Every Day Smoker    Packs/day: 0.25    Years: 39.00    Pack years: 9.75    Types: Cigarettes  . Smokeless tobacco: Never Used  . Tobacco comment: encouraged smoking cessation, offered nicotine patch upon DC--relayed msg to primary team  Vaping Use  . Vaping Use: Never used  Substance and Sexual Activity  . Alcohol use: Yes    Alcohol/week: 2.0 standard drinks    Types: 2 Cans of beer per week    Comment: per pt statement 2 cans beer on weekend with friends occassionally  . Drug use: Not Currently    Types: Marijuana    Comment: Hasn't smoked in a  "long time"  . Sexual activity: Not Currently  Other Topics Concern  . Not on file  Social History Narrative  . Not on file   Social Determinants of Health   Financial Resource Strain: High Risk  . Difficulty of Paying Living Expenses: Very hard  Food Insecurity: No Food Insecurity  . Worried About Charity fundraiser in the Last Year: Never true  . Ran Out of Food in the Last Year: Never true  Transportation Needs: Unmet Transportation Needs  . Lack of Transportation (Medical): No  . Lack of Transportation (Non-Medical): Yes  Physical Activity: Inactive  . Days of Exercise per Week: 0 days  . 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 History of heart failure notable for no members. History of arrhythmia notable for uncle needing pacemaker. No history of bicuspid aortic valve or aortic aneurysm or dissection.  ROS:   Please see the history of present illness.    Bilateral knee and back pain All other systems reviewed and are negative.  EKGs/Labs/Other Studies Reviewed:    The following studies were reviewed today:  EKG:  08/09/20:  Sinus tachycardia rate 113 bpm  Transthoracic Echocardiogram: Date:08/09/20 Results: IEcho 11/26/2019 Left ventricular ejection fraction, by estimation, is 30 to 35%. The  left ventricle has moderately decreased function. The left ventricle  demonstrates global hypokinesis. The left ventricular internal cavity size  was mildly to moderately dilated.  Left ventricular diastolic parameters are indeterminate.  2. Right ventricular systolic function is normal. The right ventricular  size is normal.  3. The mitral valve is normal in structure. Trivial mitral valve   regurgitation. No evidence of mitral stenosis.  4. The aortic valve is tricuspid. There is mild calcification of the  aortic valve. There is mild thickening of the aortic valve. Aortic valve  regurgitation is not visualized. No aortic stenosis is present.  5. The inferior vena cava is normal in size with <50% respiratory  variability, suggesting right atrial pressure of 8 mmHg.   NonCardiac CT: Date: 08/08/2020 Results: Stable mediastinal lymph node; aortic atherosclerosis, CAC  Left/Right Heart Catheterizations: Date: 12/06/20 Normal coronary arteries with a dominant left circumflex system.  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  RECOMMENDATION: Guideline directed medical therapy for HFrEF.  Smoking cessation.   Recent Labs: 10/22/2020: Magnesium 1.8 11/24/2020: B Natriuretic Peptide 86.2 11/26/2020: BUN 21; Creatinine, Ser 0.99; Hemoglobin 15.6; Platelets 117; Potassium 3.9;  Sodium 137  Recent Lipid Panel    Component Value Date/Time   CHOL 251 (H) 11/26/2020 0027   TRIG 429 (H) 11/26/2020 0027   HDL 33 (L) 11/26/2020 0027   CHOLHDL 7.6 11/26/2020 0027   VLDL UNABLE TO CALCULATE IF TRIGLYCERIDE OVER 400 mg/dL 11/26/2020 0027   LDLCALC UNABLE TO CALCULATE IF TRIGLYCERIDE OVER 400 mg/dL 11/26/2020 0027   LDLDIRECT 155.3 (H) 11/26/2020 0027     Risk Assessment/Calculations:     N/A  Physical Exam:    VS:  BP 130/80   Pulse 93   Wt 202 lb (91.6 kg)   SpO2 97%   BMI 30.71 kg/m     Wt Readings from Last 3 Encounters:  01/13/21 202 lb (91.6 kg)  12/02/20 216 lb (98 kg)  11/26/20 201 lb 11.5 oz (91.5 kg)    GEN: Well nourished, well developed in no acute distress HEENT: Normal NECK: No JVD; No carotid bruits LYMPHATICS: No lymphadenopathy CARDIAC: RRR, no murmurs, rubs, gallops RESPIRATORY:  Clear to auscultation without rales, wheezing or rhonchi  ABDOMEN: Soft, non-tender,  non-distended MUSCULOSKELETAL:  Notable abdominal wall edema; No deformity  SKIN: Warm and dry NEUROLOGIC:  Alert and oriented x 3 PSYCHIATRIC:  Normal affect   ASSESSMENT:    1. HFrEF (heart failure with reduced ejection fraction) (HCC)    PLAN:    In order of problems listed above:   Heart Failure reduced Ejection Fraction  - NYHA class III, Stage C, hypervolemic, etiology from unknown cause with negative ischemic work up - Diuretic regimen: lasix 80 mg PO Daily - Discussed the importance of fluid restriction of < 2 L, salt restriction, and checking daily weights  - BMP/Mg/anemia panel in 5-7 days of Entresto start - Coreg 25 mg PO BID  - Continue losartan 25 mg PO daily until Entresto start (reached out to prior pharmacy steward for assistance) - Continue aldactone 25 mg PO Daily - SGLT2i continued  - Device Indications: has had intermittent GDMT until recently; will plan to reasess echo late 2022 for this discussion - we will write a letter clarifying his medication needs - Indication for AHF: long term could be appropriate  Aortic Atherosclerosis Coronary calcification HLD (mixed with triglyceridemia) - atorvastatin to 40 mg PO daily  Daytime Somnolence - Epworth greater than 18 - Will need Sleep Study (pending  Tobacco Abuse: Down to 1 pack every three days - discussed the dangers of tobacco use, both inhaled and oral, which include, but are not limited to cardiovascular disease, increased cancer risk of multiple types of cancer, COPD, peripheral arterial disease, strokes. - counseled on the benefits of smoking cessation. - firmly advised to quit.  - we also reviewed strategies to maximize success, including:  Removing cigarettes and smoking materials from environment   Stress management  Substitution of other forms of reinforcement (the one cigarette a day approach)  Support of family/friends and group smoking cessation  Selecting a quit date  Patient  provided contact information for [employer] or 1-800-QUIT-NOW  Patient provided with [place] 8 free smoking cessation classes: [phone] and [email or web address]    Summer/Fall Follow up  unless new symptoms or abnormal test results warranting change in plan  Would be reasonable for  APP Follow up  Time Spent Directly with Patient:   I have spent a total of 8 minuteswith the patient reviewing notes, imaging, EKGs, labs and examining the patient as well as establishing an assessment and plan that was discussed personally with the  patient.  > 50% of time was spent in direct patient care.   Medication Adjustments/Labs and Tests Ordered: Current medicines are reviewed at length with the patient today.  Concerns regarding medicines are outlined above.  Orders Placed This Encounter  Procedures  . Basic metabolic panel  . Magnesium  . Vitamin B12  . Folate  . Iron and TIBC  . Ferritin   Meds ordered this encounter  Medications  . sacubitril-valsartan (ENTRESTO) 24-26 MG    Sig: Take 1 tablet by mouth 2 (two) times daily.    Dispense:  180 tablet    Refill:  3    Patient Instructions  Medication Instructions:  Your physician has recommended you make the following change in your medication:  START: sacubitril- valsartan (Entresto) 24-26 mg by mouth daily   Wait 3 days in between stopping Losartan and starting Entresto  *If you need a refill on your cardiac medications before your next appointment, please call your pharmacy*   Lab Work: 7-10 DAYS AFTER STARTING ENTRESTO: BMP, MG, ANEMIA PANEL If you have labs (blood work) drawn today and your tests are completely normal, you will receive your results only by: Marland Kitchen MyChart Message (if you have MyChart) OR . A paper copy in the mail If you have any lab test that is abnormal or we need to change your treatment, we will call you to review the results.   Testing/Procedures: NONE   Follow-Up: At [facility], you and your health needs are our priority.  As part of our continuing mission to provide you with exceptional heart care, we have created designated Provider Care Teams.  These Care Teams include your primary Cardiologist (physician) and Advanced Practice Providers (APPs -  Physician Assistants and Nurse Practitioners) who all work together to provide you with the care you need, when you need it.  We recommend signing up for the patient portal called "MyChart".  Sign up information is provided on this After Visit Summary.  MyChart is used to connect with patients for Virtual Visits (Telemedicine).  Patients are able to view lab/test results, encounter notes, upcoming appointments, etc.  Non-urgent messages can be sent to your provider as well.   To learn more about what you can do with MyChart, go to [email or web address].    Your next appointment:   4-6 month(s)  The format for your next appointment:   In Person  Provider:   You may see Werner Lean, MD or one of the following Advanced Practice Providers on your designated Care Team:    Melina Copa, PA-C  Ermalinda Barrios, PA-C    Other Instructions Call and let the office know when you start taking Entresto so we can schedule follow up lab work.      Signed, Werner Lean, MD  01/13/2021 4:33 PM    [facility]

## 2021-01-13 NOTE — Telephone Encounter (Signed)
Letter for Cody Carney.    To Whom It May Concern,  Cody Carney suffers from a condition called heart failure with reduced ejection fraction, for which the medications that he is presently on are life saving.  It is imperative that no matter his present living situation that he be afforded his medication as prescribed, as failure to do this could be life threatening.  Thank you for your consideration.   Riley Lam, MD Cardiologist [facility]  [address] [place], [address] [place], [place] [address] [phone]  5:11 PM

## 2021-01-13 NOTE — Patient Instructions (Addendum)
Medication Instructions:  Your physician has recommended you make the following change in your medication:  START: sacubitril- valsartan (Entresto) 24-26 mg by mouth daily   Wait 3 days in between stopping Losartan and starting Entresto  *If you need a refill on your cardiac medications before your next appointment, please call your pharmacy*   Lab Work: 7-10 DAYS AFTER STARTING ENTRESTO: BMP, MG, ANEMIA PANEL If you have labs (blood work) drawn today and your tests are completely normal, you will receive your results only by: Marland Kitchen MyChart Message (if you have MyChart) OR . A paper copy in the mail If you have any lab test that is abnormal or we need to change your treatment, we will call you to review the results.   Testing/Procedures: NONE   Follow-Up: At [facility], you and your health needs are our priority.  As part of our continuing mission to provide you with exceptional heart care, we have created designated Provider Care Teams.  These Care Teams include your primary Cardiologist (physician) and Advanced Practice Providers (APPs -  Physician Assistants and Nurse Practitioners) who all work together to provide you with the care you need, when you need it.  We recommend signing up for the patient portal called "MyChart".  Sign up information is provided on this After Visit Summary.  MyChart is used to connect with patients for Virtual Visits (Telemedicine).  Patients are able to view lab/test results, encounter notes, upcoming appointments, etc.  Non-urgent messages can be sent to your provider as well.   To learn more about what you can do with MyChart, go to [email or web address].    Your next appointment:   4-6 month(s)  The format for your next appointment:   In Person  Provider:   You may see Christell Constant, MD or one of the following Advanced Practice Providers on your designated Care Team:    Ronie Spies, PA-C  Jacolyn Reedy, PA-C    Other  Instructions Call and let the office know when you start taking Entresto so we can schedule follow up lab work.

## 2021-01-14 ENCOUNTER — Telehealth: Payer: Self-pay

## 2021-01-14 ENCOUNTER — Other Ambulatory Visit (HOSPITAL_COMMUNITY): Payer: Self-pay

## 2021-01-14 NOTE — Telephone Encounter (Signed)
Letter written by  Dr. Izora Ribas placed at front desk for patient to pick up.    To Whom It May Concern,  Cody Carney suffers from a condition called heart failure with reduced ejection fraction, for which the medications that he is presently on are life saving.  It is imperative that no matter his present living situation that he be afforded his medication as prescribed, as failure to do this could be life threatening.  Thank you for your consideration.   Cody Lam, MD Cardiologist [facility]  [address] [place], [address] [place], [place] [address] [phone]

## 2021-01-18 ENCOUNTER — Telehealth: Payer: Self-pay

## 2021-01-18 NOTE — Telephone Encounter (Signed)
Called patient to inform him that paperwork for Sherryll Burger appeal is at the Heart Failure clinic at the hospital ([address]  Also, let him know that letter that Dr. Izora Ribas wrote for his lawyer is at our office.  Patient will call patient assistance for transport with this information.  Patient had no questions or concerns.

## 2021-01-19 NOTE — Progress Notes (Signed)
Letter for [facility] expedited appeal request.

## 2021-01-26 NOTE — Telephone Encounter (Signed)
**Note De-Identified [name] Obfuscation** Letter received Cody Carney fax from [facility] stating that a expedited appeal was done on the pts Entresto on 4/18. It is unclear who did the expedited appeal but they have several questions that need to be addressed. I answered all questions and included the pts office visit notes with Dr Kenn File from 4/7 with a * marked beside all answers asked within the office visit notes as proof as well.  I have emailed all to Dr Domenick Bookbinder nurse so she can fax all to [facility] at the fax number written on cover letter included or to place in the nurses box in Medical Records to be faxed.

## 2021-01-27 ENCOUNTER — Telehealth: Payer: Self-pay | Admitting: Internal Medicine

## 2021-01-27 NOTE — Telephone Encounter (Signed)
PT's insurance is calling with concerns about all the medications.Insurance is stating that all his medications need a prior authorization.Please advise

## 2021-01-27 NOTE — Telephone Encounter (Signed)
Request for additional information faxed successfully to [facility].

## 2021-01-28 NOTE — Telephone Encounter (Signed)
**Note De-Identified Cody Carney Obfuscation** See phone note from 4/21 [name] and Entresto PA's) for updates.

## 2021-01-28 NOTE — Telephone Encounter (Signed)
**Note De-Identified Laynee Lockamy Obfuscation** Letter received from [employer] of [facility]) stating that they denied the pts [place] PA for not meeting the plans criteria of: 1. Not having a DX of CHF (the pt does have CHF). 2.LV EF less than 40% (the pts EF per Echo on 11/25/20 is 30-35%). 3. Answer to Contraindications questions (?).  I am not aware of a Entresto PA being done through [employer] but I did responded to a [place] PA denial that we received from [facility] yesterday.  We also received a letter from [facility] stating that they denied the pts [place] PA because he has primary RX coverage through another plan. Maybe the other plan is [employer].   I will continue looking into this as I am not sure who the pts ins plan is or who started these PAs through both [facility] and [employer].

## 2021-01-28 NOTE — Telephone Encounter (Signed)
**Note De-Identified [place] Obfuscation** Cody Deist PA started today through covermymeds. Key: Cody Carney appeal was faxed to [facility] yesterday.

## 2021-01-31 ENCOUNTER — Other Ambulatory Visit (HOSPITAL_COMMUNITY): Payer: Self-pay

## 2021-01-31 NOTE — Telephone Encounter (Signed)
**Note De-Identified Syrai Gladwin Obfuscation** Per [facility] the pts primary insurance is [employer] and they provided me with the information needed from his card to do these PAs as follows:  ID: [id number] BIN: [id number] PCN:[id number] GRP: [id number]  I started a Entresto PA through covermymeds: [id number]  I also started a [place] PA through covermymeds. [id number]

## 2021-02-01 NOTE — Telephone Encounter (Signed)
**Note De-Identified Cody Carney Obfuscation** I called the number listed as the pts home and cell phone (same number) but a lady answered my call and when I asked to s/w the pt she stated that Cody Carney no longer lives there and that she does not know how to contact him.

## 2021-02-01 NOTE — Telephone Encounter (Signed)
**Note De-Identified Onda Kattner Obfuscation** Following message received from covermymeds: [name] Key: [id number] - PA Case ID: [id number] Outcome Denied on April 25 We show that you have primary pharmacy benefit coverage through another plan. Your pharmacy should bill the prescription to the primary plan before billing [place]. If you do not agree with this, please contact Customer Service at the number provided on your ID card. Form [employer] HIM Electronic Prior Authorization Form ([employer]) 2017 NCPDP  I will call the pt to discuss as I have tried to do his Sherryll Burger and [place] PAs through covermymeds with [employer] and [facility] but each are denying due to "We show that you have primary pharmacy benefit coverage through another plan".

## 2021-02-09 NOTE — Telephone Encounter (Signed)
**Note De-Identified Cody Carney Obfuscation** I s/w Cyndia Bent who states that the pts Entresto RX is not going through the pts ins plan. I gave her all the information that we received from letter from [facility]/[employer] concerning the [name] approval but none of it helped as there is no phone number or address to contact them at.  She is aware that I have been unable to reach the pt and states that if the Pt contacts them concerning his [name] and [name] they will have him call Dr Debby Bud office at [phone] so we can discuss which insurance his primary plan is with.

## 2021-02-09 NOTE — Telephone Encounter (Signed)
**Note De-Identified Joley Utecht Obfuscation** Letter received Makeya Hilgert fax from [facility]/[employer] stating that they have approved coverage of the pts Entresto until 01/26/2022. Case ID: [id number]  I have notified [facility] of this approval.

## 2021-02-09 NOTE — Telephone Encounter (Signed)
Cyndia Bent with [facility] is requesting to speak with Larita Fife.  She states the [facility] approval is still not going thru on their end.

## 2021-02-10 NOTE — Telephone Encounter (Signed)
**Note De-Identified Krizia Flight Obfuscation** Urgent Entresto PA started again through covermymeds using Key: [id number] (provided with [facility] PA request Abygail Galeno fax from [place]). Message received:  Your information has been sent to [facility].

## 2021-04-20 ENCOUNTER — Encounter: Payer: Self-pay | Admitting: Internal Medicine

## 2021-05-13 ENCOUNTER — Ambulatory Visit: Payer: Medicaid Other | Admitting: Internal Medicine

## 2021-05-13 NOTE — Progress Notes (Deleted)
Cardiology Office Note:    Date:  05/13/2021   ID:  Cody Carney, DOB [birth year], MRN [id number]  PCP:  Barnetta Chapel, NP  [facility] Cardiologist:  Werner Lean, MD  [facility] Electrophysiologist:  None   CC: Follow up HF   History of Present Illness:    Cody Carney is a 60 y.o. male with a hx of HTN, Aortic Atherosclerosis with Coronary Artery Calcifications, COPD and Tobacco Abuse with lymph node seen on CT who presented 08/2020 with decompensated HF.  In interim of this visit, continued to uptitrate GDMT, and patient had increase in his lasix to attempt to hold of admission.  Needed admission  11/24/20; had improvement of LVEF and diuresis.  Saw [facility] 2/24 with transition to lasix 80 mg BID, restart of aldactone and transition from losartan 25 mg.; Seen last been seen ***  Patient notes that he is doing not much of anything.  Since last visit notes no changes.  There are no interval hospital/ED visit.    No chest pain or pressure.  No SOB/DOE and no PND/Orthopnea.  Notes weight loss but with knee surgery.  No palpitations or syncope . Notes sleeping issues and back pain, but no sleeping upright.   Past Medical History:  Diagnosis Date   Acute exacerbation of CHF (congestive heart failure) ([place]) 08/08/2020   Aortic atherosclerosis ([place]) 10/12/2020   CHF (congestive heart failure) (HCC)    COPD (chronic obstructive pulmonary disease) ([place])    Coronary artery calcification 10/12/2020   Essential hypertension 08/08/2020   HFrEF (heart failure with reduced ejection fraction) ([place]) 11/24/2020   Hypertension    Ischemic chest pain ([place]) 08/08/2020   Mixed hyperlipidemia 10/12/2020   Tobacco abuse 08/08/2020    Past Surgical History:  Procedure Laterality Date   LEG SURGERY     RIGHT/LEFT HEART CATH AND CORONARY ANGIOGRAPHY N/A 11/26/2020   Procedure: RIGHT/LEFT HEART CATH AND CORONARY ANGIOGRAPHY;  Surgeon: Troy Sine, MD;  Location: [facility];  Service:  Cardiovascular;  Laterality: N/A;    Current Medications: No outpatient medications have been marked as taking for the 05/13/21 encounter (Appointment) with Werner Lean, MD.   Current Facility-Administered Medications for the 05/13/21 encounter (Appointment) with Werner Lean, MD  Medication   acetaminophen (TYLENOL) tablet 650 mg   ondansetron (ZOFRAN) 4 mg in sodium chloride 0.9 % 50 mL IVPB     Allergies:   Patient has no allergy information on record.   Social History   Socioeconomic History   Marital status: Single    Spouse name: Not on file   Number of children: Not on file   Years of education: Not on file   Highest education level: Not on file  Occupational History   Not on file  Tobacco Use   Smoking status: Every Day    Packs/day: 0.25    Years: 39.00    Pack years: 9.75    Types: Cigarettes   Smokeless tobacco: Never   Tobacco comments:    encouraged smoking cessation, offered nicotine patch upon DC--relayed msg to primary team  Vaping Use   Vaping Use: Never used  Substance and Sexual Activity   Alcohol use: Yes    Alcohol/week: 2.0 standard drinks    Types: 2 Cans of beer per week    Comment: per pt statement 2 cans beer on weekend with friends occassionally   Drug use: Not Currently    Types: Marijuana    Comment: Hasn't  smoked in a "long time"   Sexual activity: Not Currently  Other Topics Concern   Not on file  Social History Narrative   Not on file   Social Determinants of Health   Financial Resource Strain: High Risk   Difficulty of Paying Living Expenses: Very hard  Food Insecurity: No Food Insecurity   Worried About Charity fundraiser in the Last Year: Never true   Arboriculturist in the Last Year: Never true  Transportation Needs: Unmet Transportation Needs   Lack of Transportation (Medical): No   Lack of Transportation (Non-Medical): Yes  Physical Activity: Inactive   Days of Exercise per Week: 0 days   Minutes of  Exercise per Session: 0 min  Stress: Not on file  Social Connections: Not on file    Social:  Former Museum/gallery curator, 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 History of heart failure notable for no members. History of arrhythmia notable for uncle needing pacemaker. No history of bicuspid aortic valve or aortic aneurysm or dissection.  ROS:   Please see the history of present illness.    Bilateral knee and back pain All other systems reviewed and are negative.  EKGs/Labs/Other Studies Reviewed:    The following studies were reviewed today:  EKG:  08/09/20:  Sinus tachycardia rate 113 bpm  Transthoracic Echocardiogram: Date:08/09/20 Results: IE[facility] 11/26/2019 Left ventricular ejection fraction, by estimation, is 30 to 35%. The  left ventricle has moderately decreased function. The left ventricle  demonstrates global hypokinesis. The left ventricular internal cavity size  was mildly to moderately dilated.  Left ventricular diastolic parameters are indeterminate.   2. Right ventricular systolic function is normal. The right ventricular  size is normal.   3. The mitral valve is normal in structure. Trivial mitral valve  regurgitation. No evidence of mitral stenosis.   4. The aortic valve is tricuspid. There is mild calcification of the  aortic valve. There is mild thickening of the aortic valve. Aortic valve  regurgitation is not visualized. No aortic stenosis is present.   5. The inferior vena cava is normal in size with <50% respiratory  variability, suggesting right atrial pressure of 8 mmHg.   NonCardiac CT: Date: 08/08/2020 Results: Stable mediastinal lymph node; aortic atherosclerosis, CAC  Left/Right Heart Catheterizations: Date: 12/06/20 Normal coronary arteries with a dominant left circumflex system.   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   RECOMMENDATION: Guideline directed medical therapy for HFrEF.  Smoking cessation.   Recent Labs: 10/22/2020: Magnesium 1.8 11/24/2020: B Natriuretic Peptide 86.2 11/26/2020: BUN 21; Creatinine, Ser 0.99; Hemoglobin 15.6; Platelets 117; Potassium 3.9; Sodium 137  Recent Lipid Panel    Component Value Date/Time   CHOL 251 (H) 11/26/2020 0027   TRIG 429 (H) 11/26/2020 0027   HDL 33 (L) 11/26/2020 0027   CHOLHDL 7.6 11/26/2020 0027   VLDL UNABLE TO CALCULATE IF TRIGLYCERIDE OVER 400 mg/dL 11/26/2020 0027   LDLCALC UNABLE TO CALCULATE IF TRIGLYCERIDE OVER 400 mg/dL 11/26/2020 0027   LDLDIRECT 155.3 (H) 11/26/2020 0027     Risk Assessment/Calculations:     N/A  Physical Exam:    VS:  There were no vitals taken for this visit.    Wt Readings from Last 3 Encounters:  01/13/21 202 lb (91.6 kg)  12/02/20 216  lb (98 kg)  11/26/20 201 lb 11.5 oz (91.5 kg)    GEN: Well nourished, well developed in no acute distress HEENT: Normal NECK: No JVD; No carotid bruits LYMPHATICS: No lymphadenopathy CARDIAC: RRR, no murmurs, rubs, gallops RESPIRATORY:  Clear to auscultation without rales, wheezing or rhonchi  ABDOMEN: Soft, non-tender, non-distended MUSCULOSKELETAL:  Notable abdominal wall edema; No deformity  SKIN: Warm and dry NEUROLOGIC:  Alert and oriented x 3 PSYCHIATRIC:  Normal affect   ASSESSMENT:    No diagnosis found.  PLAN:    In order of problems listed above:   Heart Failure reduced Ejection Fraction  - NYHA class III, Stage C, hypervolemic, etiology from unknown cause with negative ischemic work up - Diuretic regimen: lasix 80 mg PO Daily - Discussed the importance of fluid restriction of < 2 L, salt restriction, and checking daily weights  - BMP/Mg/anemia panel in 5-7 days of Entresto start - Coreg 25 mg PO BID  - Continue losartan 25 mg PO daily until Entresto start (reached out to  prior pharmacy steward for assistance) - Continue aldactone 25 mg PO Daily - SGLT2i continued  - Device Indications: has had intermittent GDMT until recently; will plan to reasess echo late 2022 for this discussion - we will write a letter clarifying his medication needs - Indication for AHF: long term could be appropriate  Aortic Atherosclerosis Coronary calcification HLD (mixed with triglyceridemia) - atorvastatin to 40 mg PO daily  Daytime Somnolence - Epworth greater than 18 - Will need Sleep Study (pending)  Tobacco Abuse: Down to 1 pack every three days     Summer/Fall Follow up  unless new symptoms or abnormal test results warranting change in plan  Would be reasonable for  APP Follow up  Time Spent Directly with Patient:   I have spent a total of 40 minuteswith the patient reviewing notes, imaging, EKGs, labs and examining the patient as well as establishing an assessment and plan that was discussed personally with the patient.  > 50% of time was spent in direct patient care.   Medication Adjustments/Labs and Tests Ordered: Current medicines are reviewed at length with the patient today.  Concerns regarding medicines are outlined above.  No orders of the defined types were placed in this encounter.  No orders of the defined types were placed in this encounter.   There are no Patient Instructions on file for this visit.   Signed, Werner Lean, MD  05/13/2021 7:49 AM    [facility]

## 2021-05-23 ENCOUNTER — Ambulatory Visit: Payer: PRIVATE HEALTH INSURANCE | Admitting: Internal Medicine

## 2021-05-27 ENCOUNTER — Ambulatory Visit (INDEPENDENT_AMBULATORY_CARE_PROVIDER_SITE_OTHER): Payer: Medicaid Other | Admitting: Orthopaedic Surgery

## 2021-05-27 ENCOUNTER — Other Ambulatory Visit: Payer: Self-pay

## 2021-05-27 ENCOUNTER — Ambulatory Visit (INDEPENDENT_AMBULATORY_CARE_PROVIDER_SITE_OTHER): Payer: Medicaid Other

## 2021-05-27 ENCOUNTER — Encounter: Payer: Self-pay | Admitting: Orthopaedic Surgery

## 2021-05-27 VITALS — BP 135/88 | HR 106 | Ht 68.0 in | Wt 200.0 lb

## 2021-05-27 DIAGNOSIS — M79605 Pain in left leg: Secondary | ICD-10-CM | POA: Diagnosis not present

## 2021-05-27 DIAGNOSIS — M1712 Unilateral primary osteoarthritis, left knee: Secondary | ICD-10-CM

## 2021-05-27 NOTE — Progress Notes (Signed)
Office Visit Note   Patient: Cody Carney           Date of Birth: [birth year]           MRN: [id number] Visit Date: 05/27/2021              Requested by: Eber Jones, NP [address] [place],  [place] [address] PCP: Eber Jones, NP   Assessment & Plan: Visit Diagnoses:  1. Pain in left leg   2. Unilateral primary osteoarthritis, left knee     Plan: Patient would have to have tibial nail removed in order to have a total knee arthroplasty.  This would best be done as a staged procedure.  He understands that due to the broken screw have to screw would still be left in distally.  [place].  Universal removal set would be necessary for rod removal.  Few months after rod removal we could proceed with total knee arthroplasty.  Follow-Up Instructions: No follow-ups on file.   Orders:  Orders Placed This Encounter  Procedures   XR Tibia/Fibula Left   No orders of the defined types were placed in this encounter.     Procedures: No procedures performed   Clinical Data: No additional findings.   Subjective: Chief Complaint  Patient presents with   Lower Back - Pain   Left Knee - Pain    HPI 60 year old male states he has been working on getting disability since 2019.  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  He states he has had knee pain and had previous tibial nail placed for tibial fracture which is healed.  He states he has chronic back pain.  Has coronary artery disease.  He has pain in his left knee and previous x-ray showed knee osteoarthritis.  Patient has some chronic systolic heart failure.  Mixed hyperlipidemia.  Patient denies associated bowel or bladder symptoms with his low back pain.  He is taking Aleve in the past which helped but has been told not to take anti-inflammatories due to his hypertension and heart disease with history of heart failure.  Lumbar x-rays have  demonstrated degenerative spurring at L2-3 and L3-4.  Knee x-rays left obtained in June showed bone-on-bone medial compartment.  Patient referred by Terrilyn Saver, AG, NP for evaluation of his back and knee pain.  Review of Systems all the systems noncontributory to HPI.   Objective: Vital Signs: BP 135/88   Pulse (!) 106   Ht 5\' 8"  (1.727 m)   Wt 200 lb (90.7 kg)   BMI 30.41 kg/m   Physical Exam Constitutional:      Appearance: He is well-developed.  HENT:     Head: Normocephalic and atraumatic.     Right Ear: External ear normal.     Left Ear: External ear normal.  Eyes:     Pupils: Pupils are equal, round, and reactive to light.  Neck:     Thyroid: No thyromegaly.     Trachea: No tracheal deviation.  Cardiovascular:     Rate and Rhythm: Normal rate.  Pulmonary:     Effort: Pulmonary effort is normal.     Breath sounds: No wheezing.  Abdominal:     General: Bowel sounds are normal.     Palpations: Abdomen is soft.  Musculoskeletal:     Cervical back: Neck supple.  Skin:  General: Skin is warm and dry.     Capillary Refill: Capillary refill takes less than 2 seconds.  Neurological:     Mental Status: He is alert and oriented to person, place, and time.  Psychiatric:        Behavior: Behavior normal.        Thought Content: Thought content normal.        Judgment: Judgment normal.    Ortho Exam patient has negative straight leg raising 90 degrees.  Well-healed incisions from tibial nail insertion.  Proximal distal interlock screw is palpable.  No effusion of the ankle joint.  Some pain with ankle dorsiflexion plantarflexion.  Slow deliberate short stride gait with left knee limp.  Specialty Comments:  No specialty comments available.  Imaging: No results found.   PMFS History: Patient Active Problem List   Diagnosis Date Noted   Unilateral primary osteoarthritis, left knee 05/30/2021   HFrEF (heart failure with reduced ejection fraction) (HCC) 11/24/2020    Acute on chronic systolic heart failure (HCC) 10/12/2020   Aortic atherosclerosis (HCC) 10/12/2020   Coronary artery calcification 10/12/2020   Mixed hyperlipidemia 10/12/2020   COPD (chronic obstructive pulmonary disease) (HCC) 08/08/2020   Essential hypertension 08/08/2020   Acute on chronic combined systolic and diastolic CHF (congestive heart failure) (HCC) 08/08/2020   Ischemic chest pain (HCC) 08/08/2020   Tobacco abuse 08/08/2020   Acute exacerbation of CHF (congestive heart failure) (HCC) 08/08/2020   Past Medical History:  Diagnosis Date   Acute exacerbation of CHF (congestive heart failure) (HCC) 08/08/2020   Aortic atherosclerosis (HCC) 10/12/2020   CHF (congestive heart failure) (HCC)    COPD (chronic obstructive pulmonary disease) (HCC)    Coronary artery calcification 10/12/2020   Essential hypertension 08/08/2020   HFrEF (heart failure with reduced ejection fraction) (HCC) 11/24/2020   Hypertension    Ischemic chest pain (HCC) 08/08/2020   Mixed hyperlipidemia 10/12/2020   Tobacco abuse 08/08/2020    No family history on file.  Past Surgical History:  Procedure Laterality Date   LEG SURGERY     RIGHT/LEFT HEART CATH AND CORONARY ANGIOGRAPHY N/A 11/26/2020   Procedure: RIGHT/LEFT HEART CATH AND CORONARY ANGIOGRAPHY;  Surgeon: Lennette Bihari, MD;  [facility];  Service: Cardiovascular;  Laterality: N/A;   Social History   Occupational History   Not on file  Tobacco Use   Smoking status: Every Day    Packs/day: 0.25    Years: 39.00    Pack years: 9.75    Types: Cigarettes   Smokeless tobacco: Never   Tobacco comments:    encouraged smoking cessation, offered nicotine patch upon DC--relayed msg to primary team  Vaping Use   Vaping Use: Never used  Substance and Sexual Activity   Alcohol use: Yes    Alcohol/week: 2.0 standard drinks    Types: 2 Cans of beer per week    Comment: per pt statement 2 cans beer on weekend with friends occassionally    Drug use: Not Currently    Types: Marijuana    Comment: Hasn't smoked in a "long time"   Sexual activity: Not Currently

## 2021-05-30 DIAGNOSIS — M1712 Unilateral primary osteoarthritis, left knee: Secondary | ICD-10-CM | POA: Insufficient documentation

## 2021-06-10 ENCOUNTER — Telehealth: Payer: Self-pay

## 2021-06-10 NOTE — Telephone Encounter (Signed)
**Note De-Identified Cody Carney Obfuscation** F/u on Entresto PA done on 4/21. Covermymeds has closed this request (?).  I started another Entresto PA through covermymeds. Key: [id number]

## 2021-06-16 ENCOUNTER — Ambulatory Visit (INDEPENDENT_AMBULATORY_CARE_PROVIDER_SITE_OTHER): Payer: Medicaid Other | Admitting: Surgery

## 2021-06-16 ENCOUNTER — Ambulatory Visit: Payer: Self-pay

## 2021-06-16 ENCOUNTER — Encounter: Payer: Self-pay | Admitting: Surgery

## 2021-06-16 ENCOUNTER — Other Ambulatory Visit: Payer: Self-pay

## 2021-06-16 VITALS — BP 153/93 | HR 112 | Ht 68.0 in | Wt 200.0 lb

## 2021-06-16 DIAGNOSIS — S42002A Fracture of unspecified part of left clavicle, initial encounter for closed fracture: Secondary | ICD-10-CM | POA: Diagnosis not present

## 2021-06-16 DIAGNOSIS — M898X1 Other specified disorders of bone, shoulder: Secondary | ICD-10-CM | POA: Diagnosis not present

## 2021-06-16 DIAGNOSIS — M25522 Pain in left elbow: Secondary | ICD-10-CM | POA: Diagnosis not present

## 2021-06-16 MED ORDER — HYDROCODONE-ACETAMINOPHEN 7.5-325 MG PO TABS: 1.0000 | ORAL_TABLET | Freq: Three times a day (TID) | ORAL | 0 refills | Status: DC | PRN

## 2021-06-16 NOTE — Progress Notes (Signed)
Office Visit Note   Patient: Cody Carney           Date of Birth: Jan 16, 1961           MRN: [id number] Visit Date: 06/16/2021              Requested by: Eber Jones, NP [address] [place],  [place] [address] PCP: Eber Jones, NP   Assessment & Plan: Visit Diagnoses:  1. Collar bone pain   2. Pain in left elbow   Left elbow contusion  Plan Patient was put in a shoulder sling.  We will follow-up with Dr. Ophelia Charter in 1 week to discuss possible surgical intervention for his clavicle fracture.  Advised him not to use his left arm.  Follow-Up Instructions: Return in about 1 week (around 06/23/2021) for with dr yates recheck left clavicle fracture.  question surgery.   Orders:  Orders Placed This Encounter  Procedures   XR Clavicle Left   XR Elbow 2 Views Left   Meds ordered this encounter  Medications   HYDROcodone-acetaminophen (NORCO) 7.5-325 MG tablet    Sig: Take 1 tablet by mouth every 8 (eight) hours as needed for moderate pain.    Dispense:  30 tablet    Refill:  0      Procedures: No procedures performed   Clinical Data: No additional findings.   Subjective: Chief Complaint  Patient presents with   left clavicle    HPI 60 year old white male comes in for evaluation of left clavicle fracture.  States that on September 2020 he was in the back of his friend's truck when they took off and he fell off the back of the tailgate.  He was seen at the [facility] ED and left shoulder x-ray report showed:  CLINICAL DATA:  Fall from truck yesterday. Shoulder pain with  limited range of motion.   EXAM:  DG SHOULDER 3+VIEWS LEFT   COMPARISON:  Chest radiographs 04/14/2021.   FINDINGS:  There is an acute fracture of the distal clavicle with 1.3 cm of  superior displacement. There is no definite extension into the  acromioclavicular joint which appears intact. The acromion,  remainder of the scapula and proximal humerus appear intact. Mild  underlying  glenohumeral degenerative changes are present.  Patient was put in a sling but has not been wearing it.  He is also been having some pain in his left elbow.  X-rays not taken of his elbow.  Review of Systems No current cardiac pulmonary GI GU issues  Objective: Vital Signs: BP (!) 153/93   Pulse (!) 112   Ht 5\' 8"  (1.727 m)   Wt 200 lb (90.7 kg)   BMI 30.41 kg/m   Physical Exam HENT:     Head: Normocephalic.  Pulmonary:     Effort: No respiratory distress.  Musculoskeletal:     Comments: I did not the left shoulder through range of motion.  He is obviously markedly tender over the fracture site of the clavicle.  Neurovascular intact.  Left elbow good range of motion.  No swelling or bruising.  Elbow is somewhat tender to palpation.  Neurological:     Mental Status: He is alert.    Ortho Exam  Specialty Comments:  No specialty comments available.  Imaging: No results found.   PMFS History: Patient Active Problem List   Diagnosis Date Noted   Unilateral primary osteoarthritis, left knee 05/30/2021   HFrEF (heart failure with reduced ejection fraction) (HCC) 11/24/2020  Acute on chronic systolic heart failure (HCC) 10/12/2020   Aortic atherosclerosis (HCC) 10/12/2020   Coronary artery calcification 10/12/2020   Mixed hyperlipidemia 10/12/2020   COPD (chronic obstructive pulmonary disease) (HCC) 08/08/2020   Essential hypertension 08/08/2020   Acute on chronic combined systolic and diastolic CHF (congestive heart failure) (HCC) 08/08/2020   Ischemic chest pain (HCC) 08/08/2020   Tobacco abuse 08/08/2020   Acute exacerbation of CHF (congestive heart failure) (HCC) 08/08/2020   Past Medical History:  Diagnosis Date   Acute exacerbation of CHF (congestive heart failure) (HCC) 08/08/2020   Aortic atherosclerosis (HCC) 10/12/2020   CHF (congestive heart failure) (HCC)    COPD (chronic obstructive pulmonary disease) (HCC)    Coronary artery calcification 10/12/2020    Essential hypertension 08/08/2020   HFrEF (heart failure with reduced ejection fraction) (HCC) 11/24/2020   Hypertension    Ischemic chest pain (HCC) 08/08/2020   Mixed hyperlipidemia 10/12/2020   Tobacco abuse 08/08/2020    History reviewed. No pertinent family history.  Past Surgical History:  Procedure Laterality Date   LEG SURGERY     RIGHT/LEFT HEART CATH AND CORONARY ANGIOGRAPHY N/A 11/26/2020   Procedure: RIGHT/LEFT HEART CATH AND CORONARY ANGIOGRAPHY;  Surgeon: Lennette Bihari, MD;  Location: [facility];  Service: Cardiovascular;  Laterality: N/A;   Social History   Occupational History   Not on file  Tobacco Use   Smoking status: Every Day    Packs/day: 0.25    Years: 39.00    Pack years: 9.75    Types: Cigarettes   Smokeless tobacco: Never   Tobacco comments:    encouraged smoking cessation, offered nicotine patch upon DC--relayed msg to primary team  Vaping Use   Vaping Use: Never used  Substance and Sexual Activity   Alcohol use: Yes    Alcohol/week: 2.0 standard drinks    Types: 2 Cans of beer per week    Comment: per pt statement 2 cans beer on weekend with friends occassionally   Drug use: Not Currently    Types: Marijuana    Comment: Hasn't smoked in a "long time"   Sexual activity: Not Currently

## 2021-06-23 ENCOUNTER — Telehealth: Payer: Self-pay | Admitting: Orthopaedic Surgery

## 2021-06-23 ENCOUNTER — Ambulatory Visit: Payer: Medicaid Other | Admitting: Surgery

## 2021-06-23 NOTE — Telephone Encounter (Signed)
Pt called requesting refill of hydrocodone. Please send to pharmacy. Please call pt at [phone] when medication has been sent in.

## 2021-06-24 ENCOUNTER — Telehealth: Payer: Self-pay | Admitting: Orthopaedic Surgery

## 2021-06-24 ENCOUNTER — Other Ambulatory Visit: Payer: Self-pay | Admitting: Surgery

## 2021-06-24 MED ORDER — HYDROCODONE-ACETAMINOPHEN 5-325 MG PO TABS
1.0000 | ORAL_TABLET | Freq: Three times a day (TID) | ORAL | 0 refills | Status: DC
Start: 2021-06-24 — End: 2021-10-21

## 2021-06-24 NOTE — Telephone Encounter (Signed)
Pt called several time about prescription refill. Py called this morning and spoke with the triage stating we are waiting for PA [facility] to send script. Pt states he is in severe pain and can any doctor sign off on his pain medication. Please call this patient back right away at [phone]. Please send meds to pharmacy on file.

## 2021-06-24 NOTE — Telephone Encounter (Signed)
**Note De-Identified Brittlyn Cloe Obfuscation** [name] Key: [id number] - PA Case ID: [id number] Outcome Denied on September 2  We show that you have primary pharmacy benefit coverage through another plan. Your pharmacy should bill the prescription to the primary plan before billing [place].  Drug: Sherryll Burger 24-26MG  tablets Form: [facility] Medicaid of [employer] Prior Authorization Request Form [phone] NCPDP)  I have notified [facility] of this outcome Nikoli Nasser VM as I was on hold for more than 5 mins.

## 2021-06-24 NOTE — Telephone Encounter (Signed)
Called patient. No answer. Left message that medication has been sent to his pharmacy. 

## 2021-06-28 ENCOUNTER — Ambulatory Visit (INDEPENDENT_AMBULATORY_CARE_PROVIDER_SITE_OTHER): Payer: Medicaid Other | Admitting: Orthopaedic Surgery

## 2021-06-28 ENCOUNTER — Ambulatory Visit: Payer: Medicaid Other | Admitting: Orthopaedic Surgery

## 2021-06-28 ENCOUNTER — Ambulatory Visit (INDEPENDENT_AMBULATORY_CARE_PROVIDER_SITE_OTHER): Payer: Medicaid Other

## 2021-06-28 ENCOUNTER — Other Ambulatory Visit: Payer: Self-pay

## 2021-06-28 VITALS — BP 144/93 | HR 101 | Ht 68.0 in | Wt 212.0 lb

## 2021-06-28 DIAGNOSIS — M898X1 Other specified disorders of bone, shoulder: Secondary | ICD-10-CM

## 2021-06-28 DIAGNOSIS — S42032D Displaced fracture of lateral end of left clavicle, subsequent encounter for fracture with routine healing: Secondary | ICD-10-CM | POA: Diagnosis not present

## 2021-06-28 DIAGNOSIS — S42009A Fracture of unspecified part of unspecified clavicle, initial encounter for closed fracture: Secondary | ICD-10-CM | POA: Insufficient documentation

## 2021-06-28 MED ORDER — HYDROCODONE-ACETAMINOPHEN 5-325 MG PO TABS: 1.0000 | ORAL_TABLET | Freq: Four times a day (QID) | ORAL | 0 refills | Status: DC | PRN
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
## 2021-07-25 ENCOUNTER — Ambulatory Visit: Payer: No Typology Code available for payment source | Admitting: Internal Medicine

## 2021-07-26 ENCOUNTER — Ambulatory Visit: Payer: Self-pay

## 2021-07-26 ENCOUNTER — Ambulatory Visit (INDEPENDENT_AMBULATORY_CARE_PROVIDER_SITE_OTHER): Payer: Medicaid Other | Admitting: Orthopaedic Surgery

## 2021-07-26 ENCOUNTER — Encounter: Payer: Self-pay | Admitting: Orthopaedic Surgery

## 2021-07-26 ENCOUNTER — Other Ambulatory Visit: Payer: Self-pay

## 2021-07-26 VITALS — BP 142/93 | HR 89 | Ht 68.0 in | Wt 212.0 lb

## 2021-07-26 DIAGNOSIS — S42032D Displaced fracture of lateral end of left clavicle, subsequent encounter for fracture with routine healing: Secondary | ICD-10-CM | POA: Diagnosis not present

## 2021-07-26 NOTE — Progress Notes (Signed)
Office Visit Note   Patient: Cody Carney           Date of Birth: 02-18-1961           MRN: [id number] Visit Date: 07/26/2021              Requested by: Eber Jones, NP [address] [place],  [place] [address] PCP: Eber Jones, NP   Assessment & Plan: Visit Diagnoses:  1. Closed displaced fracture of acromial end of left clavicle with routine healing, subsequent encounter     Plan: Return 3 months repeat x-rays left clavicle on return.  Follow-Up Instructions: Return in about 3 months (around 10/26/2021).   Orders:  Orders Placed This Encounter  Procedures   XR Clavicle Left   No orders of the defined types were placed in this encounter.     Procedures: No procedures performed   Clinical Data: No additional findings.   Subjective: Chief Complaint  Patient presents with   Left Shoulder - Fracture, Follow-up    HPI patient returns states she is having some persistent soreness in his clavicle pain with activity.  He was trying to help someone carry some shingles up a ladder and states he had significant pain.  He can get his arm overhead but with lifting or pushing he notices discomfort and points to the fracture site.  Patient still has severe left knee osteoarthritis and is waiting till his living situation is better to call about rod removal so he can later schedule total knee arthroplasty.  Review of Systems all other systems updated.  Positive COPD coronary artery disease and hypertension.no angina.    Objective: Vital Signs: BP (!) 142/93   Pulse 89   Ht 5\' 8"  (1.727 m)   Wt 212 lb (96.2 kg)   BMI 32.23 kg/m   Physical Exam Constitutional:      Appearance: He is well-developed.  HENT:     Head: Normocephalic and atraumatic.     Right Ear: External ear normal.     Left Ear: External ear normal.  Eyes:     Pupils: Pupils are equal, round, and reactive to light.  Neck:     Thyroid: No thyromegaly.     Trachea: No tracheal deviation.   Cardiovascular:     Rate and Rhythm: Normal rate.  Pulmonary:     Effort: Pulmonary effort is normal.     Breath sounds: No wheezing.  Abdominal:     General: Bowel sounds are normal.     Palpations: Abdomen is soft.  Musculoskeletal:     Cervical back: Neck supple.  Skin:    General: Skin is warm and dry.     Capillary Refill: Capillary refill takes less than 2 seconds.  Neurological:     Mental Status: He is alert and oriented to person, place, and time.  Psychiatric:        Behavior: Behavior normal.        Thought Content: Thought content normal.        Judgment: Judgment normal.    Ortho Exam persistent prominence of left distal clavicle.  Tenderness at the fracture site.  Specialty Comments:  No specialty comments available.  Imaging: No results found.   PMFS History: Patient Active Problem List   Diagnosis Date Noted   Clavicle fracture 06/28/2021   Unilateral primary osteoarthritis, left knee 05/30/2021   HFrEF (heart failure with reduced ejection fraction) (HCC) 11/24/2020   Acute on chronic systolic heart failure (HCC)  10/12/2020   Aortic atherosclerosis (HCC) 10/12/2020   Coronary artery calcification 10/12/2020   Mixed hyperlipidemia 10/12/2020   COPD (chronic obstructive pulmonary disease) (HCC) 08/08/2020   Essential hypertension 08/08/2020   Acute on chronic combined systolic and diastolic CHF (congestive heart failure) (HCC) 08/08/2020   Ischemic chest pain (HCC) 08/08/2020   Tobacco abuse 08/08/2020   Acute exacerbation of CHF (congestive heart failure) (HCC) 08/08/2020   Past Medical History:  Diagnosis Date   Acute exacerbation of CHF (congestive heart failure) (HCC) 08/08/2020   Aortic atherosclerosis (HCC) 10/12/2020   CHF (congestive heart failure) (HCC)    COPD (chronic obstructive pulmonary disease) (HCC)    Coronary artery calcification 10/12/2020   Essential hypertension 08/08/2020   HFrEF (heart failure with reduced ejection fraction)  (HCC) 11/24/2020   Hypertension    Ischemic chest pain (HCC) 08/08/2020   Mixed hyperlipidemia 10/12/2020   Tobacco abuse 08/08/2020    History reviewed. No pertinent family history.  Past Surgical History:  Procedure Laterality Date   LEG SURGERY     RIGHT/LEFT HEART CATH AND CORONARY ANGIOGRAPHY N/A 11/26/2020   Procedure: RIGHT/LEFT HEART CATH AND CORONARY ANGIOGRAPHY;  Surgeon: Lennette Bihari, MD;  Location: [facility];  Service: Cardiovascular;  Laterality: N/A;   Social History   Occupational History   Not on file  Tobacco Use   Smoking status: Every Day    Packs/day: 0.25    Years: 39.00    Pack years: 9.75    Types: Cigarettes   Smokeless tobacco: Never   Tobacco comments:    encouraged smoking cessation, offered nicotine patch upon DC--relayed msg to primary team  Vaping Use   Vaping Use: Never used  Substance and Sexual Activity   Alcohol use: Yes    Alcohol/week: 2.0 standard drinks    Types: 2 Cans of beer per week    Comment: per pt statement 2 cans beer on weekend with friends occassionally   Drug use: Not Currently    Types: Marijuana    Comment: Hasn't smoked in a "long time"   Sexual activity: Not Currently

## 2021-08-18 NOTE — Progress Notes (Deleted)
Cardiology Office Note:    Date:  08/18/2021   ID:  Cody Carney, DOB [birth year], MRN [id number]  PCP:  Barnetta Chapel, NP  [facility] Cardiologist:  Werner Lean, MD  [facility] Electrophysiologist:  None   CC: Follow up HF   History of Present Illness:    Cody Carney is a 60 y.o. male with a hx of HTN, Aortic Atherosclerosis with Coronary Artery Calcifications, COPD and Tobacco Abuse with lymph node seen on CT who presented 08/2020 with decompensated HF.  In interim of this visit, continued to uptitrate GDMT, and patient had increase in his lasix to attempt to hold of admission.  Needed admission  11/24/20; had improvement of LVEF and diuresis.  Saw [facility] 2/24 with transition to lasix 80 mg BID, restart of aldactone and transition from losartan 25 mg.  Last seen in April- at that time he had concerns that due to some legal issues he may have limited follow up.  Has missed a visit subsequently.  Seen 07/25/21.  Patient notes that he is doing ***  No chest pain or pressure ***.  No SOB/DOE*** and no PND/Orthopnea***.  No weight gain or leg swelling***.  No palpitations or syncope ***.  Ambulatory blood pressure ***.    Past Medical History:  Diagnosis Date   Acute exacerbation of CHF (congestive heart failure) ([place]) 08/08/2020   Aortic atherosclerosis ([place]) 10/12/2020   CHF (congestive heart failure) (HCC)    COPD (chronic obstructive pulmonary disease) ([place])    Coronary artery calcification 10/12/2020   Essential hypertension 08/08/2020   HFrEF (heart failure with reduced ejection fraction) ([place]) 11/24/2020   Hypertension    Ischemic chest pain ([place]) 08/08/2020   Mixed hyperlipidemia 10/12/2020   Tobacco abuse 08/08/2020    Past Surgical History:  Procedure Laterality Date   LEG SURGERY     RIGHT/LEFT HEART CATH AND CORONARY ANGIOGRAPHY N/A 11/26/2020   Procedure: RIGHT/LEFT HEART CATH AND CORONARY ANGIOGRAPHY;  Surgeon: Troy Sine, MD;  Location: [place] [facility];  Service: Cardiovascular;  Laterality: N/A;    Current Medications: No outpatient medications have been marked as taking for the 08/19/21 encounter (Appointment) with Werner Lean, MD.   Current Facility-Administered Medications for the 08/19/21 encounter (Appointment) with Werner Lean, MD  Medication   acetaminophen (TYLENOL) tablet 650 mg   ondansetron (ZOFRAN) 4 mg in sodium chloride 0.9 % 50 mL IVPB     Allergies:   Codeine   Social History   Socioeconomic History   Marital status: Single    Spouse name: Not on file   Number of children: Not on file   Years of education: Not on file   Highest education level: Not on file  Occupational History   Not on file  Tobacco Use   Smoking status: Every Day    Packs/day: 0.25    Years: 39.00    Pack years: 9.75    Types: Cigarettes   Smokeless tobacco: Never   Tobacco comments:    encouraged smoking cessation, offered nicotine patch upon DC--relayed msg to primary team  Vaping Use   Vaping Use: Never used  Substance and Sexual Activity   Alcohol use: Yes    Alcohol/week: 2.0 standard drinks    Types: 2 Cans of beer per week    Comment: per pt statement 2 cans beer on weekend with friends occassionally   Drug use: Not Currently    Types: Marijuana    Comment: Hasn't smoked in  a "long time"   Sexual activity: Not Currently  Other Topics Concern   Not on file  Social History Narrative   Not on file   Social Determinants of Health   Financial Resource Strain: High Risk   Difficulty of Paying Living Expenses: Very hard  Food Insecurity: No Food Insecurity   Worried About Charity fundraiser in the Last Year: Never true   Ran Out of Food in the Last Year: Never true  Transportation Needs: Unmet Transportation Needs   Lack of Transportation (Medical): No   Lack of Transportation (Non-Medical): Yes  Physical Activity: Inactive   Days of Exercise per Week: 0 days   Minutes of Exercise per  Session: 0 min  Stress: Not on file  Social Connections: Not on file    Social:  Former Museum/gallery curator, 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 History of heart failure notable for no members. History of arrhythmia notable for uncle needing pacemaker. No history of bicuspid aortic valve or aortic aneurysm or dissection.  ROS:   Please see the history of present illness.    All other systems reviewed and are negative.  EKGs/Labs/Other Studies Reviewed:    The following studies were reviewed today:  EKG:  08/09/20:  Sinus tachycardia rate 113 bpm  Transthoracic Echocardiogram: Date:Echo 11/26/2019 Results: Left ventricular ejection fraction, by estimation, is 30 to 35%. The  left ventricle has moderately decreased function. The left ventricle  demonstrates global hypokinesis. The left ventricular internal cavity size  was mildly to moderately dilated.  Left ventricular diastolic parameters are indeterminate.   2. Right ventricular systolic function is normal. The right ventricular  size is normal.   3. The mitral valve is normal in structure. Trivial mitral valve  regurgitation. No evidence of mitral stenosis.   4. The aortic valve is tricuspid. There is mild calcification of the  aortic valve. There is mild thickening of the aortic valve. Aortic valve  regurgitation is not visualized. No aortic stenosis is present.   5. The inferior vena cava is normal in size with <50% respiratory  variability, suggesting right atrial pressure of 8 mmHg.   NonCardiac CT: Date: 08/08/2020 Results: Stable mediastinal lymph node; aortic atherosclerosis, CAC  Left/Right Heart Catheterizations: Date: 12/06/20 Normal coronary arteries with a dominant left circumflex system.   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    RECOMMENDATION: Guideline directed medical therapy for HFrEF.  Smoking cessation.   Recent Labs: 10/22/2020: Magnesium 1.8 11/24/2020: B Natriuretic Peptide 86.2 11/26/2020: BUN 21; Creatinine, Ser 0.99; Hemoglobin 15.6; Platelets 117; Potassium 3.9; Sodium 137  Recent Lipid Panel    Component Value Date/Time   CHOL 251 (H) 11/26/2020 0027   TRIG 429 (H) 11/26/2020 0027   HDL 33 (L) 11/26/2020 0027   CHOLHDL 7.6 11/26/2020 0027   VLDL UNABLE TO CALCULATE IF TRIGLYCERIDE OVER 400 mg/dL 11/26/2020 0027   LDLCALC UNABLE TO CALCULATE IF TRIGLYCERIDE OVER 400 mg/dL 11/26/2020 0027   LDLDIRECT 155.3 (H) 11/26/2020 0027    Physical Exam:    VS:  There were no vitals taken for this visit.    Wt Readings from Last 3 Encounters:  07/26/21 212 lb (96.2 kg)  06/28/21 212 lb (96.2 kg)  06/16/21 200 lb (90.7 kg)    GEN: Well nourished, well developed in no acute distress HEENT: Normal NECK: No  JVD; No carotid bruits *** LYMPHATICS: No lymphadenopathy CARDIAC: RRR, no murmurs, rubs, gallops RESPIRATORY:  Clear to auscultation without rales, wheezing or rhonchi  ABDOMEN: Soft, non-tender, non-distended MUSCULOSKELETAL:  Notable abdominal wall edema; No deformity  SKIN: Warm and dry NEUROLOGIC:  Alert and oriented x 3 PSYCHIATRIC:  Normal affect   ASSESSMENT:    No diagnosis found.  PLAN:    In order of problems listed above:   Heart Failure reduced Ejection Fraction  - NYHA class III, Stage C, hypervolemic, etiology from unknown cause with negative ischemic work up - Diuretic regimen: lasix 80 mg PO Daily - Coreg 25 mg PO BID  - *** Entresto - Continue aldactone 25 mg PO Daily - SGLT2i continued  - Device Indications: has had intermittent GDMT until recently; will plan to reasess echo late 2022 for this discussion - Indication for AHF: long term could be appropriate  Aortic Atherosclerosis Coronary calcification HLD (mixed with triglyceridemia) - atorvastatin  40 mg PO  daily  Daytime Somnolence - Epworth greater than 18 - Will need Sleep Study (pending)  Tobacco Abuse: Down to 1 pack every three days  Will plan for *** follow up unless new symptoms or abnormal test results warranting change in plan  Would be reasonable for *** APP Follow up     Medication Adjustments/Labs and Tests Ordered: Current medicines are reviewed at length with the patient today.  Concerns regarding medicines are outlined above.  No orders of the defined types were placed in this encounter.  No orders of the defined types were placed in this encounter.   There are no Patient Instructions on file for this visit.   Signed, Werner Lean, MD  08/18/2021 1:27 PM    [facility]

## 2021-08-19 ENCOUNTER — Ambulatory Visit: Payer: Medicaid Other | Admitting: Internal Medicine

## 2021-08-25 ENCOUNTER — Ambulatory Visit: Payer: Medicaid Other | Admitting: Internal Medicine

## 2021-08-25 NOTE — Progress Notes (Deleted)
Cardiology Office Note:    Date:  08/25/2021   ID:  Cody Carney, DOB Dec 27, 1960, MRN [id number]  PCP:  Barnetta Chapel, NP  [facility] Cardiologist:  Werner Lean, MD  [facility] Electrophysiologist:  None   CC: Follow up HF   History of Present Illness:    [name] is a 60 y.o. male with a hx of HTN, Aortic Atherosclerosis with Coronary Artery Calcifications, COPD and Tobacco Abuse with lymph node seen on CT who presented 08/2020 with decompensated HF.  In interim of this visit, continued to uptitrate GDMT, and patient had increase in his lasix to attempt to hold of admission.  Needed admission  11/24/20; had improvement of LVEF and diuresis.  Saw [facility] 2/24 with transition to lasix 80 mg BID, restart of aldactone and transition from losartan 25 mg.  Last seen in April- at that time he had concerns that due to some legal issues he may have limited follow up.  Has missed a visit subsequently.  Seen 07/25/21.  Patient notes that he is doing ***  No chest pain or pressure ***.  No SOB/DOE*** and no PND/Orthopnea***.  No weight gain or leg swelling***.  No palpitations or syncope ***.  Ambulatory blood pressure ***.    Past Medical History:  Diagnosis Date   Acute exacerbation of CHF (congestive heart failure) ([place]) 08/08/2020   Aortic atherosclerosis ([place]) 10/12/2020   CHF (congestive heart failure) (HCC)    COPD (chronic obstructive pulmonary disease) ([place])    Coronary artery calcification 10/12/2020   Essential hypertension 08/08/2020   HFrEF (heart failure with reduced ejection fraction) ([place]) 11/24/2020   Hypertension    Ischemic chest pain ([place]) 08/08/2020   Mixed hyperlipidemia 10/12/2020   Tobacco abuse 08/08/2020    Past Surgical History:  Procedure Laterality Date   LEG SURGERY     RIGHT/LEFT HEART CATH AND CORONARY ANGIOGRAPHY N/A 11/26/2020   Procedure: RIGHT/LEFT HEART CATH AND CORONARY ANGIOGRAPHY;  Surgeon: Troy Sine, MD;  Location: [place] [facility];  Service: Cardiovascular;  Laterality: N/A;    Current Medications: No outpatient medications have been marked as taking for the 08/25/21 encounter (Appointment) with Werner Lean, MD.   Current Facility-Administered Medications for the 08/25/21 encounter (Appointment) with Werner Lean, MD  Medication   acetaminophen (TYLENOL) tablet 650 mg   ondansetron (ZOFRAN) 4 mg in sodium chloride 0.9 % 50 mL IVPB     Allergies:   Codeine   Social History   Socioeconomic History   Marital status: Single    Spouse name: Not on file   Number of children: Not on file   Years of education: Not on file   Highest education level: Not on file  Occupational History   Not on file  Tobacco Use   Smoking status: Every Day    Packs/day: 0.25    Years: 39.00    Pack years: 9.75    Types: Cigarettes   Smokeless tobacco: Never   Tobacco comments:    encouraged smoking cessation, offered nicotine patch upon DC--relayed msg to primary team  Vaping Use   Vaping Use: Never used  Substance and Sexual Activity   Alcohol use: Yes    Alcohol/week: 2.0 standard drinks    Types: 2 Cans of beer per week    Comment: per pt statement 2 cans beer on weekend with friends occassionally   Drug use: Not Currently    Types: Marijuana    Comment: Hasn't smoked in  a "long time"   Sexual activity: Not Currently  Other Topics Concern   Not on file  Social History Narrative   Not on file   Social Determinants of Health   Financial Resource Strain: High Risk   Difficulty of Paying Living Expenses: Very hard  Food Insecurity: No Food Insecurity   Worried About Charity fundraiser in the Last Year: Never true   Ran Out of Food in the Last Year: Never true  Transportation Needs: Unmet Transportation Needs   Lack of Transportation (Medical): No   Lack of Transportation (Non-Medical): Yes  Physical Activity: Inactive   Days of Exercise per Week: 0 days   Minutes of Exercise per  Session: 0 min  Stress: Not on file  Social Connections: Not on file    Social:  Former Museum/gallery curator, 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 History of heart failure notable for no members. History of arrhythmia notable for uncle needing pacemaker. No history of bicuspid aortic valve or aortic aneurysm or dissection.  ROS:   Please see the history of present illness.    All other systems reviewed and are negative.  EKGs/Labs/Other Studies Reviewed:    The following studies were reviewed today:  EKG:  08/09/20:  Sinus tachycardia rate 113 bpm  Transthoracic Echocardiogram: Date:Echo 11/26/2019 Results: Left ventricular ejection fraction, by estimation, is 30 to 35%. The  left ventricle has moderately decreased function. The left ventricle  demonstrates global hypokinesis. The left ventricular internal cavity size  was mildly to moderately dilated.  Left ventricular diastolic parameters are indeterminate.   2. Right ventricular systolic function is normal. The right ventricular  size is normal.   3. The mitral valve is normal in structure. Trivial mitral valve  regurgitation. No evidence of mitral stenosis.   4. The aortic valve is tricuspid. There is mild calcification of the  aortic valve. There is mild thickening of the aortic valve. Aortic valve  regurgitation is not visualized. No aortic stenosis is present.   5. The inferior vena cava is normal in size with <50% respiratory  variability, suggesting right atrial pressure of 8 mmHg.   NonCardiac CT: Date: 08/08/2020 Results: Stable mediastinal lymph node; aortic atherosclerosis, CAC  Left/Right Heart Catheterizations: Date: 12/06/20 Normal coronary arteries with a dominant left circumflex system.   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    RECOMMENDATION: Guideline directed medical therapy for HFrEF.  Smoking cessation.   Recent Labs: 10/22/2020: Magnesium 1.8 11/24/2020: B Natriuretic Peptide 86.2 11/26/2020: BUN 21; Creatinine, Ser 0.99; Hemoglobin 15.6; Platelets 117; Potassium 3.9; Sodium 137  Recent Lipid Panel    Component Value Date/Time   CHOL 251 (H) 11/26/2020 0027   TRIG 429 (H) 11/26/2020 0027   HDL 33 (L) 11/26/2020 0027   CHOLHDL 7.6 11/26/2020 0027   VLDL UNABLE TO CALCULATE IF TRIGLYCERIDE OVER 400 mg/dL 11/26/2020 0027   LDLCALC UNABLE TO CALCULATE IF TRIGLYCERIDE OVER 400 mg/dL 11/26/2020 0027   LDLDIRECT 155.3 (H) 11/26/2020 0027    Physical Exam:    VS:  There were no vitals taken for this visit.    Wt Readings from Last 3 Encounters:  07/26/21 212 lb (96.2 kg)  06/28/21 212 lb (96.2 kg)  06/16/21 200 lb (90.7 kg)    GEN: Well nourished, well developed in no acute distress HEENT: Normal NECK: No  JVD; No carotid bruits *** LYMPHATICS: No lymphadenopathy CARDIAC: RRR, no murmurs, rubs, gallops RESPIRATORY:  Clear to auscultation without rales, wheezing or rhonchi  ABDOMEN: Soft, non-tender, non-distended MUSCULOSKELETAL:  Notable abdominal wall edema; No deformity  SKIN: Warm and dry NEUROLOGIC:  Alert and oriented x 3 PSYCHIATRIC:  Normal affect   ASSESSMENT:    No diagnosis found.  PLAN:    In order of problems listed above:   Heart Failure reduced Ejection Fraction  - NYHA class III, Stage C, hypervolemic, etiology from unknown cause with negative ischemic work up - Diuretic regimen: lasix 80 mg PO Daily - Coreg 25 mg PO BID  - *** Entresto - Continue aldactone 25 mg PO Daily - SGLT2i continued  - Device Indications: has had intermittent GDMT until recently; will plan to reasess echo late 2022 for this discussion - Indication for AHF: long term could be appropriate  Aortic Atherosclerosis Coronary calcification HLD (mixed with triglyceridemia) - atorvastatin  40 mg PO  daily  Daytime Somnolence - Epworth greater than 18 - Will need Sleep Study (pending)  Tobacco Abuse: Down to 1 pack every three days  Will plan for *** follow up unless new symptoms or abnormal test results warranting change in plan  Would be reasonable for *** APP Follow up     Medication Adjustments/Labs and Tests Ordered: Current medicines are reviewed at length with the patient today.  Concerns regarding medicines are outlined above.  No orders of the defined types were placed in this encounter.  No orders of the defined types were placed in this encounter.   There are no Patient Instructions on file for this visit.   Signed, Werner Lean, MD  08/25/2021 11:35 AM    [place]

## 2021-08-28 DIAGNOSIS — G459 Transient cerebral ischemic attack, unspecified: Secondary | ICD-10-CM | POA: Diagnosis not present

## 2021-08-28 DIAGNOSIS — I340 Nonrheumatic mitral (valve) insufficiency: Secondary | ICD-10-CM

## 2021-09-20 ENCOUNTER — Ambulatory Visit: Payer: Medicaid Other | Admitting: Internal Medicine

## 2021-09-22 ENCOUNTER — Ambulatory Visit: Payer: Medicaid Other | Admitting: Internal Medicine

## 2021-10-09 DIAGNOSIS — I639 Cerebral infarction, unspecified: Secondary | ICD-10-CM

## 2021-10-09 HISTORY — DX: Cerebral infarction, unspecified: I63.9

## 2021-10-21 ENCOUNTER — Encounter: Payer: Self-pay | Admitting: Internal Medicine

## 2021-10-21 ENCOUNTER — Ambulatory Visit (INDEPENDENT_AMBULATORY_CARE_PROVIDER_SITE_OTHER): Payer: Medicaid Other | Admitting: Internal Medicine

## 2021-10-21 ENCOUNTER — Other Ambulatory Visit: Payer: Self-pay

## 2021-10-21 VITALS — BP 130/70 | HR 90 | Ht 68.0 in | Wt 220.0 lb

## 2021-10-21 DIAGNOSIS — I5020 Unspecified systolic (congestive) heart failure: Secondary | ICD-10-CM | POA: Diagnosis not present

## 2021-10-21 DIAGNOSIS — I700 Atherosclerosis of aorta: Secondary | ICD-10-CM | POA: Diagnosis not present

## 2021-10-21 DIAGNOSIS — Z8673 Personal history of transient ischemic attack (TIA), and cerebral infarction without residual deficits: Secondary | ICD-10-CM | POA: Diagnosis not present

## 2021-10-21 NOTE — Patient Instructions (Signed)
Medication Instructions:  Your physician has recommended you make the following change in your medication:  STOP: Losartan  *If you need a refill on your cardiac medications before your next appointment, please call your pharmacy*   Lab Work: TODAY: BMP, BNP, CBC, Anemia panel, FLP If you have labs (blood work) drawn today and your tests are completely normal, you will receive your results only by: [place] (if you have MyChart) OR A paper copy in the mail If you have any lab test that is abnormal or we need to change your treatment, we will call you to review the results.   Testing/Procedures: Your physician has referred you to see our Heart failure Clinic.   Your physician has requested that you have an echocardiogram in 3 months. Echocardiography is a painless test that uses sound waves to create images of your heart. It provides your doctor with information about the size and shape of your heart and how well your hearts chambers and valves are working. This procedure takes approximately one hour. There are no restrictions for this procedure.    Follow-Up: At [facility], you and your health needs are our priority.  As part of our continuing mission to provide you with exceptional heart care, we have created designated Provider Care Teams.  These Care Teams include your primary Cardiologist (physician) and Advanced Practice Providers (APPs -  Physician Assistants and Nurse Practitioners) who all work together to provide you with the care you need, when you need it.  We recommend signing up for the patient portal called "MyChart".  Sign up information is provided on this After Visit Summary.  MyChart is used to connect with patients for Virtual Visits (Telemedicine).  Patients are able to view lab/test results, encounter notes, upcoming appointments, etc.  Non-urgent messages can be sent to your provider as well.   To learn more about what you can do with MyChart, go to  [email or web address].    Your next appointment:   3-4 month(s) After Echocardiogram  The format for your next appointment:   In Person  Provider:   Werner Lean, MD     Other Instructions We will reach out to our Social Worker to see what service we can offer you.

## 2021-10-21 NOTE — Progress Notes (Signed)
Cardiology Office Note:    Date:  10/21/2021   ID:  Cody Carney, DOB April 13, 1961, MRN [id number]  PCP:  Cody Chapel, NP  [facility] Cardiologist:  Werner Lean, MD  [place] Electrophysiologist:  None   CC: Follow up HF decompensation   History of Present Illness:    Cody Carney is a 61 y.o. male with a hx of HTN, Aortic Atherosclerosis with Coronary Artery Calcifications, COPD and Tobacco Abuse with lymph node seen on CT who presented 08/2020 with decompensated HF.  In interim of this visit, continued to uptitrate GDMT, and patient had increase in his lasix to attempt to hold of admission.  Needed admission  11/24/20; had improvement of LVEF and diuresis.  Saw [place] 2/24 with transition to lasix 80 mg BID, restarted of aldactone and transition from losartan 25 mg.  He has been lost to follow up due to some social issues and presents for follow up 10/21/21.  Has been smoke free for up to 40 days.   Has stopped smoking. Feels generally weak and tired Had sudden onset face weakness associated with a TIA (sx resolved) Had RSV. Dislocated his knee and ankle because of a hole in the yard last Friday.  No chest pain or pressure.   Has needed to double up on his lasix 4 times since last eval.  Gained a couple of pounds during the holidays.  Patient has significantly improved symptoms compared to first office visit.   Past Medical History:  Diagnosis Date   Acute exacerbation of CHF (congestive heart failure) ([place]) 08/08/2020   Aortic atherosclerosis ([place]) 10/12/2020   CHF (congestive heart failure) (H[facility])    COPD (chronic obstructive pulmonary disease) ([place])    Coronary artery calcification 10/12/2020   Essential hypertension 08/08/2020   HFrEF (heart failure with reduced ejection fraction) ([place]) 11/24/2020   Hypertension    Ischemic chest pain ([place]) 08/08/2020   Mixed hyperlipidemia 10/12/2020   Tobacco abuse 08/08/2020    Past Surgical History:  Procedure Laterality  Date   LEG SURGERY     RIGHT/LEFT HEART CATH AND CORONARY ANGIOGRAPHY N/A 11/26/2020   Procedure: RIGHT/LEFT HEART CATH AND CORONARY ANGIOGRAPHY;  Surgeon: Troy Sine, MD;  Location: [facility];  Service: Cardiovascular;  Laterality: N/A;    Current Medications: Current Meds  Medication Sig   acetaminophen (TYLENOL) 325 MG tablet Take by mouth as needed.   albuterol (VENTOLIN HFA) 108 (90 Base) MCG/ACT inhaler Inhale into the lungs as needed.   atorvastatin (LIPITOR) 80 MG tablet Take 80 mg by mouth at bedtime.   [DISCONTINUED] losartan (COZAAR) 25 MG tablet Take 1 tablet (25 mg total) by mouth daily.   Current Facility-Administered Medications for the 10/21/21 encounter (Office Visit) with Werner Lean, MD  Medication   acetaminophen (TYLENOL) tablet 650 mg   ondansetron (ZOFRAN) 4 mg in sodium chloride 0.9 % 50 mL IVPB     Allergies:   Codeine   Social History   Socioeconomic History   Marital status: Single    Spouse name: Not on file   Number of children: Not on file   Years of education: Not on file   Highest education level: Not on file  Occupational History   Not on file  Tobacco Use   Smoking status: Every Day    Packs/day: 0.25    Years: 39.00    Pack years: 9.75    Types: Cigarettes   Smokeless tobacco: Never   Tobacco comments:  encouraged smoking cessation, offered nicotine patch upon DC--relayed msg to primary team  Vaping Use   Vaping Use: Never used  Substance and Sexual Activity   Alcohol use: Yes    Alcohol/week: 2.0 standard drinks    Types: 2 Cans of beer per week    Comment: per pt statement 2 cans beer on weekend with friends occassionally   Drug use: Not Currently    Types: Marijuana    Comment: Hasn't smoked in a "long time"   Sexual activity: Not Currently  Other Topics Concern   Not on file  Social History Narrative   Not on file   Social Determinants of Health   Financial Resource Strain: High Risk    Difficulty of Paying Living Expenses: Very hard  Food Insecurity: No Food Insecurity   Worried About Charity fundraiser in the Last Year: Never true   Arboriculturist in the Last Year: Never true  Transportation Needs: Unmet Transportation Needs   Lack of Transportation (Medical): No   Lack of Transportation (Non-Medical): Yes  Physical Activity: Inactive   Days of Exercise per Week: 0 days   Minutes of Exercise per Session: 0 min  Stress: Not on file  Social Connections: Not on file    Social:  Former Museum/gallery curator, Ex wife helps with getting him to some appointments (we have met once)  Has a daughter  Family History: History of coronary artery disease notable for uncles have had MI. History of heart failure notable for no members. History of arrhythmia notable for uncle needing pacemaker. No history of bicuspid aortic valve or aortic aneurysm or dissection.  ROS:   Please see the history of present illness.    Bilateral knee and back pain All other systems reviewed and are negative.  EKGs/Labs/Other Studies Reviewed:    The following studies were reviewed today:  EKG:  08/09/20:  Sinus tachycardia rate 113 bpm  Transthoracic Echocardiogram: Date:08/09/20 Results: IEcho 11/26/2019 Left ventricular ejection fraction, by estimation, is 30 to 35%. The  left ventricle has moderately decreased function. The left ventricle  demonstrates global hypokinesis. The left ventricular internal cavity size  was mildly to moderately dilated.  Left ventricular diastolic parameters are indeterminate.   2. Right ventricular systolic function is normal. The right ventricular  size is normal.   3. The mitral valve is normal in structure. Trivial mitral valve  regurgitation. No evidence of mitral stenosis.   4. The aortic valve is tricuspid. There is mild calcification of the  aortic valve. There is mild thickening of the aortic valve. Aortic valve  regurgitation is not visualized. No aortic  stenosis is present.   5. The inferior vena cava is normal in size with <50% respiratory  variability, suggesting right atrial pressure of 8 mmHg.   NonCardiac CT: Date: 08/08/2020 Results: Stable mediastinal lymph node; aortic atherosclerosis, CAC  Left/Right Heart Catheterizations: Date: 12/06/20 Normal coronary arteries with a dominant left circumflex system.   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   RECOMMENDATION: Guideline directed medical therapy for HFrEF.  Smoking cessation.   Recent Labs: 10/22/2020: Magnesium 1.8 11/24/2020: B Natriuretic Peptide 86.2 11/26/2020: BUN 21; Creatinine, Ser 0.99; Hemoglobin 15.6; Platelets 117; Potassium 3.9; Sodium 137  Recent Lipid Panel    Component Value Date/Time   CHOL 251 (H) 11/26/2020 0027   TRIG 429 (H) 11/26/2020 0027   HDL 33 (L) 11/26/2020 0027   CHOLHDL 7.6 11/26/2020 0027   VLDL  UNABLE TO CALCULATE IF TRIGLYCERIDE OVER 400 mg/dL 11/26/2020 0027   LDLCALC UNABLE TO CALCULATE IF TRIGLYCERIDE OVER 400 mg/dL 11/26/2020 0027   LDLDIRECT 155.3 (H) 11/26/2020 0027     Physical Exam:    VS:  BP 130/70    Pulse 90    Ht '5\' 8"'  (1.727 m)    Wt 99.8 kg    SpO2 95%    BMI 33.45 kg/m     Wt Readings from Last 3 Encounters:  10/21/21 99.8 kg  07/26/21 96.2 kg  06/28/21 96.2 kg    Gen: Mild distress, appears older than stated age  Neck: No JVD Cardiac: No Rubs or Gallops, no murmur, regular rhythm +2 radial pulses Respiratory: Clear to auscultation bilaterally, normal effort, normal  respiratory rate GI: Soft, nontender, non-distended  MS: No  edema;  moves all extremities, but R ankle pain on evaluation and movement Integument: Skin feels warm Neuro:  At time of evaluation, alert and oriented to person/place/time/situation  Psych: Depressed affect, patient feels tired   ASSESSMENT:    1. HFrEF (heart failure with reduced ejection fraction) ([place])   2. Aortic  atherosclerosis ([place])   3. Hx-TIA (transient ischemic attack)     PLAN:      Heart Failure Reduced Ejection Fraction  - NYHA class III, Stage C, euvolemic etiology from unknown cause with negative ischemic work up - Diuretic regimen: lasix 80 mg PO Daily - Discussed the importance of fluid restriction of < 2 L, salt restriction, and checking daily weights  - CBC, Anemia Panel, BMP and BNP - on coreg 3.125 mg PO BID, will likely uptitrate after lab results (at that time need to go over his medications as he is not sure exactly what it his taking, brings some of his medications today) - Coreg 25 mg PO BID  - DC losartan from list - continue low dose Entresto - Continue aldactone 25 mg PO Daily - SGLT2i continued  - Echo in three months; potentially EP eval based on results  Aortic Atherosclerosis Coronary calcification HLD (mixed with triglyceridemia) Hx of TIA - ASA, 81 mg PO daily - atorvastatin 80 mg PO Daily - lipids today  Sleep study eval at next visit  Former tobacco abuse - 40 days without nicotine    Three months me or APP  Time Spent Directly with Patient:   I have spent a total of 47 minutes with the patient reviewing notes, imaging, EKGs, labs and examining the patient as well as establishing an assessment and plan that was discussed personally with the patient.  > 50% of time was spent in direct patient care.  Medication Adjustments/Labs and Tests Ordered: Current medicines are reviewed at length with the patient today.  Concerns regarding medicines are outlined above.  Orders Placed This Encounter  Procedures   CBC   Basic metabolic panel   Pro b natriuretic peptide (BNP)   Lipid panel   Vitamin B12   Folate   Iron and TIBC   Ferritin   AMB Referral to Heartcare Pharm-D   ECHOCARDIOGRAM COMPLETE   No orders of the defined types were placed in this encounter.   Patient Instructions  Medication Instructions:  Your physician has recommended you  make the following change in your medication:  STOP: Losartan  *If you need a refill on your cardiac medications before your next appointment, please call your pharmacy*   Lab Work: TODAY: BMP, BNP, CBC, Anemia panel, FLP If you have labs (blood work) drawn  today and your tests are completely normal, you will receive your results only by: MyChart Message (if you have MyChart) OR A paper copy in the mail If you have any lab test that is abnormal or we need to change your treatment, we will call you to review the results.   Testing/Procedures: Your physician has referred you to see our Heart failure Clinic.   Your physician has requested that you have an echocardiogram in 3 months. Echocardiography is a painless test that uses sound waves to create images of your heart. It provides your doctor with information about the size and shape of your heart and how well your hearts chambers and valves are working. This procedure takes approximately one hour. There are no restrictions for this procedure.    Follow-Up: At [facility], you and your health needs are our priority.  As part of our continuing mission to provide you with exceptional heart care, we have created designated Provider Care Teams.  These Care Teams include your primary Cardiologist (physician) and Advanced Practice Providers (APPs -  Physician Assistants and Nurse Practitioners) who all work together to provide you with the care you need, when you need it.  We recommend signing up for the patient portal called "MyChart".  Sign up information is provided on this After Visit Summary.  MyChart is used to connect with patients for Virtual Visits (Telemedicine).  Patients are able to view lab/test results, encounter notes, upcoming appointments, etc.  Non-urgent messages can be sent to your provider as well.   To learn more about what you can do with MyChart, go to [email or web address].    Your next appointment:   3-4 month(s)  After Echocardiogram  The format for your next appointment:   In Person  Provider:   Werner Lean, MD     Other Instructions We will reach out to our Social Worker to see what service we can offer you.     Signed, Werner Lean, MD  10/21/2021 9:16 AM    [place]

## 2021-10-25 ENCOUNTER — Ambulatory Visit (INDEPENDENT_AMBULATORY_CARE_PROVIDER_SITE_OTHER): Payer: Medicaid Other

## 2021-10-25 ENCOUNTER — Other Ambulatory Visit: Payer: Self-pay

## 2021-10-25 ENCOUNTER — Ambulatory Visit (INDEPENDENT_AMBULATORY_CARE_PROVIDER_SITE_OTHER): Payer: Medicaid Other | Admitting: Orthopaedic Surgery

## 2021-10-25 ENCOUNTER — Telehealth: Payer: Self-pay

## 2021-10-25 ENCOUNTER — Encounter: Payer: Self-pay | Admitting: Orthopaedic Surgery

## 2021-10-25 VITALS — BP 109/73 | HR 92 | Ht 68.0 in | Wt 220.0 lb

## 2021-10-25 DIAGNOSIS — S42032D Displaced fracture of lateral end of left clavicle, subsequent encounter for fracture with routine healing: Secondary | ICD-10-CM

## 2021-10-25 DIAGNOSIS — E876 Hypokalemia: Secondary | ICD-10-CM

## 2021-10-25 DIAGNOSIS — I5020 Unspecified systolic (congestive) heart failure: Secondary | ICD-10-CM

## 2021-10-25 LAB — BASIC METABOLIC PANEL
BUN/Creatinine Ratio: 20 (ref 9–20)
BUN: 19 mg/dL (ref 6–24)
CO2: 26 mmol/L (ref 20–29)
Calcium: 9.6 mg/dL (ref 8.7–10.2)
Chloride: 98 mmol/L (ref 96–106)
Creatinine, Ser: 0.93 mg/dL (ref 0.76–1.27)
Glucose: 144 mg/dL — ABNORMAL HIGH (ref 70–99)
Potassium: 3.4 mmol/L — ABNORMAL LOW (ref 3.5–5.2)
Sodium: 138 mmol/L (ref 134–144)
eGFR: 95 mL/min/{1.73_m2} (ref >59)

## 2021-10-25 LAB — LIPID PANEL
Chol/HDL Ratio: 3.8 ratio (ref 0.0–5.0)
Cholesterol, Total: 143 mg/dL (ref 100–199)
HDL: 38 mg/dL — ABNORMAL LOW (ref >39)
LDL Chol Calc (NIH): 83 mg/dL (ref 0–99)
Triglycerides: 119 mg/dL (ref 0–149)
VLDL Cholesterol Cal: 22 mg/dL (ref 5–40)

## 2021-10-25 LAB — CBC
Hematocrit: 38.6 % (ref 37.5–51.0)
Hemoglobin: 14 g/dL (ref 13.0–17.7)
MCH: 31.7 pg (ref 26.6–33.0)
MCHC: 36.3 g/dL — ABNORMAL HIGH (ref 31.5–35.7)
MCV: 88 fL (ref 79–97)
Platelets: 197 10*3/uL (ref 150–450)
RBC: 4.41 x10E6/uL (ref 4.14–5.80)
RDW: 12.6 % (ref 11.6–15.4)
WBC: 8.8 10*3/uL (ref 3.4–10.8)

## 2021-10-25 LAB — IRON AND TIBC
Iron Saturation: 38 % (ref 15–55)
Iron: 109 ug/dL (ref 38–169)
Total Iron Binding Capacity: 285 ug/dL (ref 250–450)
UIBC: 176 ug/dL (ref 111–343)

## 2021-10-25 LAB — FOLATE: Folate: 7.4 ng/mL (ref >3.0)

## 2021-10-25 LAB — VITAMIN B12: Vitamin B-12: 493 pg/mL (ref 232–1245)

## 2021-10-25 LAB — PRO B NATRIURETIC PEPTIDE: NT-Pro BNP: 131 pg/mL (ref 0–210)

## 2021-10-25 LAB — FERRITIN: Ferritin: 229 ng/mL (ref 30–400)

## 2021-10-25 MED ORDER — POTASSIUM CHLORIDE ER 10 MEQ PO TBCR: 10.0000 meq | EXTENDED_RELEASE_TABLET | Freq: Every day | ORAL | 3 refills | Status: AC

## 2021-10-25 MED ORDER — OXYCODONE-ACETAMINOPHEN 5-325 MG PO TABS
1.0000 | ORAL_TABLET | Freq: Four times a day (QID) | ORAL | 0 refills | Status: DC | PRN
Start: 2021-10-25 — End: 2021-11-04

## 2021-10-25 NOTE — Progress Notes (Signed)
Office Visit Note   Patient: Cody Carney           Date of Birth: Aug 25, 1961           MRN: [id number] Visit Date: 10/25/2021              Requested by: Eber Jones, NP [address] [place],  [place] [address] PCP: Eber Jones, NP   Assessment & Plan: Visit Diagnoses:  1. Closed displaced fracture of acromial end of left clavicle with routine healing, subsequent encounter     Plan: Patient is right-hand dominant and has fracture base of the right thumb.  Left hand he dropped objects and since his stroke last fall.  We will have him see Dr. Frazier Butt this week to make a decision whether surgery is indicated or not for the base of his thumb.  He can follow-up with me for his patellar dislocation in about 4 weeks.  He has a knee immobilizer he can put on if it is wet or slippery outside.  Fall prevention discussed.  He states he is ready to discuss total knee arthroplasty of the left knee and we discussed that he needs to have the patellar dislocation settle down first before we discussed total knee arthroplasty.  Oxycodone 30 tablets sent in for thumb fracture and post patellar dislocation pain.  Follow-Up Instructions: Return in about 4 weeks (around 11/22/2021).   Orders:  Orders Placed This Encounter  Procedures   XR Clavicle Left   Meds ordered this encounter  Medications   oxyCODONE-acetaminophen (PERCOCET/ROXICET) 5-325 MG tablet    Sig: Take 1-2 tablets by mouth every 6 (six) hours as needed for severe pain.    Dispense:  30 tablet    Refill:  0    Hand fracture and patellar dislocation      Procedures: No procedures performed   Clinical Data: No additional findings.   Subjective: Chief Complaint  Patient presents with   Right Thumb - Fracture    DOI 10/17/2021   Left Knee - Pain    DOI 10/17/2021   left clavicle fracture follow up    DOI 06/12/2021    HPI 61 year old male was seen for follow-up after left distal clavicle fracture adjacent to the  acromioclavicular joint.  He states his left shoulder been doing well was able to move his arm with shoulder up over his head and date of his injury was originally 06/12/2021.  Unfortunately patient was walking in the yard blacked out.  He has had some problems with this since he had a stroke in November.  When he blacked out he suffered a patellar dislocation on the left which had to be reduced in the [facility] emergency room.  He does not have a knee immobilizer on is able to extend and flex his knee and states his knee is still sore.  He also fell and injured his right thumb with intra-articular base first metacarpal fracture.  He is in a thumb spica Velcro brace.  Review of Systems previous problems with heart failure hypertension coronary artery disease COPD, stroke in the fall and recent blackout.  All the systems noncontributory HPI.   Objective: Vital Signs: BP 109/73    Pulse 92    Ht 5\' 8"  (1.727 m)    Wt 220 lb (99.8 kg)    BMI 33.45 kg/m   Physical Exam Constitutional:      Appearance: He is well-developed.  HENT:     Head: Normocephalic  and atraumatic.     Right Ear: External ear normal.     Left Ear: External ear normal.  Eyes:     Pupils: Pupils are equal, round, and reactive to light.  Neck:     Thyroid: No thyromegaly.     Trachea: No tracheal deviation.  Cardiovascular:     Rate and Rhythm: Normal rate.  Pulmonary:     Effort: Pulmonary effort is normal.     Breath sounds: No wheezing.  Abdominal:     General: Bowel sounds are normal.     Palpations: Abdomen is soft.  Musculoskeletal:     Cervical back: Neck supple.  Skin:    General: Skin is warm and dry.     Capillary Refill: Capillary refill takes less than 2 seconds.  Neurological:     Mental Status: He is alert and oriented to person, place, and time.  Psychiatric:        Behavior: Behavior normal.        Thought Content: Thought content normal.        Judgment: Judgment normal.    Ortho Exam  patient has positive apprehension left knee mild knee swelling full extension he can flex 110 degrees comfortably.  Well-healed scar from old tibial nail insertion.  Specialty Comments:  No specialty comments available.  Imaging: XR Clavicle Left  Result Date: 10/25/2021 2 view x-rays left clavicle demonstrates distal clavicle fracture with some callus formation.  No further displacement. Impression: Left distal clavicle fracture with partial healing.    PMFS History: Patient Active Problem List   Diagnosis Date Noted   Clavicle fracture 06/28/2021   Unilateral primary osteoarthritis, left knee 05/30/2021   HFrEF (heart failure with reduced ejection fraction) (H[facility]) 11/24/2020   Aortic atherosclerosis (H[facility]) 10/12/2020   Coronary artery calcification 10/12/2020   Mixed hyperlipidemia 10/12/2020   COPD (chronic obstructive pulmonary disease) (HCC) 08/08/2020   Essential hypertension 08/08/2020   Tobacco abuse 08/08/2020   Past Medical History:  Diagnosis Date   Acute exacerbation of CHF (congestive heart failure) ([facility]) 08/08/2020   Aortic atherosclerosis ([facility]) 10/12/2020   CHF (congestive heart failure) (H[facility])    COPD (chronic obstructive pulmonary disease) (H[facility])    Coronary artery calcification 10/12/2020   Essential hypertension 08/08/2020   HFrEF (heart failure with reduced ejection fraction) (H[facility]) 11/24/2020   Hypertension    Ischemic chest pain ([facility]) 08/08/2020   Mixed hyperlipidemia 10/12/2020   Tobacco abuse 08/08/2020    No family history on file.  Past Surgical History:  Procedure Laterality Date   LEG SURGERY     RIGHT/LEFT HEART CATH AND CORONARY ANGIOGRAPHY N/A 11/26/2020   Procedure: RIGHT/LEFT HEART CATH AND CORONARY ANGIOGRAPHY;  Surgeon: Lennette Bihari, MD;  Location: [facility];  Service: Cardiovascular;  Laterality: N/A;   Social History   Occupational History   Not on file  Tobacco Use   Smoking status: Every Day    Packs/day: 0.25    Years: 39.00     Pack years: 9.75    Types: Cigarettes   Smokeless tobacco: Never   Tobacco comments:    encouraged smoking cessation, offered nicotine patch upon DC--relayed msg to primary team  Vaping Use   Vaping Use: Never used  Substance and Sexual Activity   Alcohol use: Yes    Alcohol/week: 2.0 standard drinks    Types: 2 Cans of beer per week    Comment: per pt statement 2 cans beer on weekend with friends occassionally   Drug  use: Not Currently    Types: Marijuana    Comment: Hasn't smoked in a "long time"   Sexual activity: Not Currently

## 2021-10-25 NOTE — Telephone Encounter (Signed)
Called informed pt of K+ 3.4.  Pt has not taken potassium supplement reports was told to stop taking it at discharge from hospital.  I advised pt to take potassium 10 mEq PO QD.  Pt reports needing script will send in a script.  Pt will have f/u labs on 11/04/21 same day as pharm d OV.

## 2021-10-31 ENCOUNTER — Telehealth: Payer: Self-pay | Admitting: Internal Medicine

## 2021-10-31 NOTE — Telephone Encounter (Signed)
Cody Carney and pt called in and stated that all of these meds have been doing well expect the [place].  She stated the furosemide was working great.  She stated he is retaining fluid in his feet ankle and stomach. Best number [phone]    carvedilol (COREG) 3.125 MG tablet [id number]   1 tab by month 2X daily  potassium chloride (KLOR-CON) 10 MEQ tablet [id number]   1 tab 1X Daily  atorvastatin (LIPITOR) 80 MG tablet [id number]   1 Tab at bedtime  sacubitril-valsartan (ENTRESTO) 24-26 MG [id number] 1 Tab 2X daily  (pt stated that if BP is normal he does not take this med)  torsemide (DEMADEX) 20 MG tablet [id number]  4 tab daily (pt stated this is not working)  aspirin 81 MG EC tablet [id number]   2 tabs in the morning  nitroGLYCERIN (NITROSTAT) 0.4 MG SL tablet [id number]  as needed  clopidogrel (PLAVIX) 75 MG tablet [id number]  1 tab daily    Pt c/o swelling: STAT is pt has developed SOB within 24 hours  If swelling, where is the swelling located? Feet , ankles and Stomach   How much weight have you gained and in what time span? 9 pounds in a month   Have you gained 3 pounds in a day or 5 pounds in a week? Na   Do you have a log of your daily weights (if so, list)? No   Are you currently taking a fluid pill? Yes   Are you currently SOB? Yes some here lately   Have you traveled recently? No

## 2021-10-31 NOTE — Telephone Encounter (Signed)
New Message:    Cody Carney from Dr Rogelia Rohrer office is calling. She wants to know I  patient is supposed to be taking Furosemide or Torsemide? Also will need the correct dose for  the correct medicine. She will also need the correct dose for Coreg and Aldactone.Cody Carney

## 2021-10-31 NOTE — Telephone Encounter (Signed)
Received Bilateral Carotid Duplex from Mr. Croswell.  Mild bilateral CAS. Asymptomatic. Will follow in 2-3 years unless new sx.  Riley Lam, MD Cardiologist [facility]  [address] [place], [address] [place], [place] [address] [phone]  8:07 AM

## 2021-11-01 ENCOUNTER — Ambulatory Visit (INDEPENDENT_AMBULATORY_CARE_PROVIDER_SITE_OTHER): Payer: Medicaid Other | Admitting: Orthopedic Surgery

## 2021-11-01 ENCOUNTER — Ambulatory Visit: Payer: Self-pay

## 2021-11-01 ENCOUNTER — Other Ambulatory Visit: Payer: Self-pay

## 2021-11-01 ENCOUNTER — Encounter: Payer: Self-pay | Admitting: Orthopedic Surgery

## 2021-11-01 VITALS — BP 124/73 | HR 82 | Ht 68.0 in | Wt 220.0 lb

## 2021-11-01 DIAGNOSIS — S62211A Bennett's fracture, right hand, initial encounter for closed fracture: Secondary | ICD-10-CM

## 2021-11-01 DIAGNOSIS — M25541 Pain in joints of right hand: Secondary | ICD-10-CM | POA: Diagnosis not present

## 2021-11-01 MED ORDER — TRAMADOL HCL 50 MG PO TABS: 50.0000 mg | ORAL_TABLET | Freq: Four times a day (QID) | ORAL | 0 refills | Status: AC | PRN

## 2021-11-01 NOTE — Telephone Encounter (Signed)
I spoke with Melissa and gave her medications listed below that patient reported he was currently taking.  I let her know adjustments would be made as needed at upcoming visit with pharmacist on 1/27.  Melissa requests current med list be faxed to Dr Carolyne Fiscal at [facility] after 1/27 visit.  Fax number is [phone]

## 2021-11-01 NOTE — Telephone Encounter (Signed)
Office is calling back for update

## 2021-11-01 NOTE — Telephone Encounter (Signed)
Christell Constant, MD to Macie Burows, RN   [facility]    8:19 AM HE has a Pharm D visit coming up; we can try to get him to equivalent lasix (will wait until 11/04/21 appt), but I worry that he missing out on his [facility]

## 2021-11-03 ENCOUNTER — Telehealth: Payer: Self-pay | Admitting: Orthopedic Surgery

## 2021-11-03 DIAGNOSIS — S62211A Bennett's fracture, right hand, initial encounter for closed fracture: Secondary | ICD-10-CM | POA: Insufficient documentation

## 2021-11-03 NOTE — Progress Notes (Signed)
Office Visit Note   Patient: Cody Carney           Date of Birth: [birth year]           MRN: [id number] Visit Date: 11/01/2021              Requested by: Eber Jones, NP [address] [place],  [place] [address] PCP: Eber Jones, NP   Assessment & Plan: Visit Diagnoses:  1. Pain in thumb joint with movement of right hand   2. 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  We discussed potential pin fixation with closed versus open reduction versus cast immobilization.  The metacarpal still remains aligned with the trapezium despite the volar fracture fragment.  I looked at the thumb under live fluoroscopy and the fracture fragment was found to move with the metacarpal as a unit suggesting early healing at the fracture which would make close reduction very difficult.  We will plan to treat this nonoperatively in a cast.  We discussed that he may need a trapeziectomy with LRTI in the future if his arthritis continues to progress.  I can see him back in a few weeks with repeat x-ray to see if the fracture is consolidating.  Follow-Up Instructions: No follow-ups on file.   Orders:  Orders Placed This Encounter  Procedures   XR Hand Complete Right   Meds ordered this encounter  Medications   traMADol (ULTRAM) 50 MG tablet    Sig: Take 1 tablet (50 mg total) by mouth every 6 (six) hours as needed for up to 5 days.    Dispense:  20 tablet    Refill:  0      Procedures: No procedures performed   Clinical Data: No additional findings.   Subjective: No chief complaint on file.   Is a 61 year old right-hand-dominant male who presents with right basilar thumb pain after a ground-level fall 2 weeks ago.  His pain is improved since the fall but continues to bother him with range of motion of the thumb.  He has been wearing a thumb spica  brace.  His past medical history includes CAD with congestive heart failure, COPD, and tobacco use.  He denies any pain elsewhere in the hand.  Denies any numbness or paresthesias.   Review of Systems   Objective: Vital Signs: BP 124/73 (BP Location: Left Arm, Patient Position: Sitting)    Pulse 82    Ht 5\' 8"  (1.727 m)    Wt 220 lb (99.8 kg)    BMI 33.45 kg/m   Physical Exam  Right Hand Exam   Tenderness  Right hand tenderness location: TTP at base of thumb.  Other  Erythema: absent Sensation: normal Pulse: present  Comments:  Mild to moderate swelling at base of thumb.  Pain w/ ROM at [facility] joint.  No static or dynamic MP hyper-extension. No pain w/ ROM of wrist or remainder of fingers.      Specialty Comments:  No specialty comments available.  Imaging: No results found.   PMFS History: Patient Active Problem List   Diagnosis Date Noted   Bennett's fracture of base of metacarpal bone of right thumb 11/03/2021   Clavicle fracture 06/28/2021   Unilateral primary osteoarthritis, left knee 05/30/2021   HFrEF (heart failure with reduced ejection fraction) (HCC) 11/24/2020   Aortic  atherosclerosis (HCC) 10/12/2020   Coronary artery calcification 10/12/2020   Mixed hyperlipidemia 10/12/2020   COPD (chronic obstructive pulmonary disease) (HCC) 08/08/2020   Essential hypertension 08/08/2020   Tobacco abuse 08/08/2020   Past Medical History:  Diagnosis Date   Acute exacerbation of CHF (congestive heart failure) (HCC) 08/08/2020   Aortic atherosclerosis (HCC) 10/12/2020   CHF (congestive heart failure) (HCC)    COPD (chronic obstructive pulmonary disease) (HCC)    Coronary artery calcification 10/12/2020   Essential hypertension 08/08/2020   HFrEF (heart failure with reduced ejection fraction) (HCC) 11/24/2020   Hypertension    Ischemic chest pain (HCC) 08/08/2020   Mixed hyperlipidemia 10/12/2020   Tobacco abuse 08/08/2020    No family history on file.  Past Surgical  History:  Procedure Laterality Date   LEG SURGERY     RIGHT/LEFT HEART CATH AND CORONARY ANGIOGRAPHY N/A 11/26/2020   Procedure: RIGHT/LEFT HEART CATH AND CORONARY ANGIOGRAPHY;  Surgeon: Lennette Bihari, MD;  Location: [facility];  Service: Cardiovascular;  Laterality: N/A;   Social History   Occupational History   Not on file  Tobacco Use   Smoking status: Every Day    Packs/day: 0.25    Years: 39.00    Pack years: 9.75    Types: Cigarettes   Smokeless tobacco: Never   Tobacco comments:    encouraged smoking cessation, offered nicotine patch upon DC--relayed msg to primary team  Vaping Use   Vaping Use: Never used  Substance and Sexual Activity   Alcohol use: Yes    Alcohol/week: 2.0 standard drinks    Types: 2 Cans of beer per week    Comment: per pt statement 2 cans beer on weekend with friends occassionally   Drug use: Not Currently    Types: Marijuana    Comment: Hasn't smoked in a "long time"   Sexual activity: Not Currently

## 2021-11-03 NOTE — Telephone Encounter (Signed)
Patient's girlfriend Trula Ore called asked if patient will have a cast put on his right hand thumb or have surgery?   The number to contact patient is [phone]

## 2021-11-04 ENCOUNTER — Other Ambulatory Visit: Payer: Self-pay

## 2021-11-04 ENCOUNTER — Other Ambulatory Visit: Payer: Medicaid Other | Admitting: *Deleted

## 2021-11-04 ENCOUNTER — Ambulatory Visit (INDEPENDENT_AMBULATORY_CARE_PROVIDER_SITE_OTHER): Payer: Medicaid Other | Admitting: Pharmacist

## 2021-11-04 VITALS — BP 120/60 | HR 74

## 2021-11-04 DIAGNOSIS — I5020 Unspecified systolic (congestive) heart failure: Secondary | ICD-10-CM

## 2021-11-04 MED ORDER — FUROSEMIDE 80 MG PO TABS: 80.0000 mg | ORAL_TABLET | Freq: Every day | ORAL | 3 refills | Status: DC

## 2021-11-04 MED ORDER — NICOTINE POLACRILEX 4 MG MT GUM: 4.0000 mg | CHEWING_GUM | OROMUCOSAL | 0 refills | Status: DC | PRN

## 2021-11-04 MED ORDER — DAPAGLIFLOZIN PROPANEDIOL 10 MG PO TABS: 10.0000 mg | ORAL_TABLET | Freq: Every day | ORAL | 3 refills | Status: DC

## 2021-11-04 NOTE — Progress Notes (Signed)
Patient ID: Cody Carney                 DOB: May 13, 1961                      MRN: [id number]     HPI: Cody Carney is a 61 y.o. male referred by Dr. Izora Carney to pharmacy clinic for HF medication management. PMH is significant for HTN, CAD, TIA, tobacco abuse. Most recent LVEF 25-30% on 08/09/20. He is enrolled in the DAPA TIMI 68 trial, but done with the study drug. Was seen back in 2022 due to hypervolemia and GDMT titration. Eventually admitted due to volume overload. Then lost to follow up due to social issues. Has since been approved for medicaid.   He was seen by Dr. Izora Carney on 10/21/21. Unclear exactly what he was taking.  He presents to clinic today accompanied by his girlfriend Cody Carney. He reports that he was retaining fluid for about 9 days. Stomach was tight and he was short of breath. Finally got rid of the fluid a day or two ago. Thinks the furosemide worked better than the torsemide. He is not taking Entresto because it bottom his BP out- systolic drops to the 80's. He is also not taking [place] or spironolactone because he wasn't sure if he was supposed to be on. He states he feels good on his current medications. Is not working. Waiting on his disability. He and his girlfriend are living with a friend. This friend has 6 dogs and is not the cleanest. Is a stressful situation for him. He did start smoking again, drank a beer and picked up a cigarette. Wants to quite. Only has a few left and then is going to quite. Request the nicotine gum.  Weighs himself daily and checks BP. Weight today was 213 which is about his dry weight per pt. No swelling seen on exam.   Current CHF meds: Carvedilol 12.5 mg twice a day,  potassium 10 MEQ daily, torsemide 80mg  daily Previously tried: Entresto (hypotension) BP goal: <130/80  Family History: History of coronary artery disease notable for uncles have had MI. History of heart failure notable for no members. History of arrhythmia notable for  uncle needing pacemaker. No history of bicuspid aortic valve or aortic aneurysm or dissection  Social History: quite for 6 days then started again. < 1 ppd + ETOH  Diet: not reviewed this visit  Exercise: none due to SOB  Home BP readings: none  Wt Readings from Last 3 Encounters:  11/01/21 220 lb (99.8 kg)  10/25/21 220 lb (99.8 kg)  10/21/21 220 lb (99.8 kg)   BP Readings from Last 3 Encounters:  11/01/21 124/73  10/25/21 109/73  10/21/21 130/70   Pulse Readings from Last 3 Encounters:  11/01/21 82  10/25/21 92  10/21/21 90    Renal function: Estimated Creatinine Clearance: 99.2 mL/min (by C-G formula based on SCr of 0.93 mg/dL).  Past Medical History:  Diagnosis Date   Acute exacerbation of CHF (congestive heart failure) (HCC) 08/08/2020   Aortic atherosclerosis (HCC) 10/12/2020   CHF (congestive heart failure) (HCC)    COPD (chronic obstructive pulmonary disease) (HCC)    Coronary artery calcification 10/12/2020   Essential hypertension 08/08/2020   HFrEF (heart failure with reduced ejection fraction) (HCC) 11/24/2020   Hypertension    Ischemic chest pain (HCC) 08/08/2020   Mixed hyperlipidemia 10/12/2020   Tobacco abuse 08/08/2020    Current Outpatient Medications on File Prior to Visit  Medication Sig Dispense Refill   acetaminophen (TYLENOL) 325 MG tablet Take by mouth as needed.     albuterol (VENTOLIN HFA) 108 (90 Base) MCG/ACT inhaler Inhale into the lungs as needed.     aspirin 81 MG EC tablet Take 81 mg by mouth daily.     atorvastatin (LIPITOR) 80 MG tablet Take 80 mg by mouth at bedtime.     carvedilol (COREG) 3.125 MG tablet Take 1 tablet (3.125 mg total) by mouth in the morning. 60 tablet 3   clopidogrel (PLAVIX) 75 MG tablet Take 75 mg by mouth daily.     dapagliflozin propanediol (FARXIGA) 10 MG TABS tablet TAKE 1 TABLET (10 MG TOTAL) BY MOUTH DAILY. 30 tablet 3   furosemide (LASIX) 80 MG tablet TAKE 1 TABLET (80 MG TOTAL) BY MOUTH DAILY. TAKE EXTRA  80MG  FOR 3LB WEIGHT GAIN 40 tablet 3   guaiFENesin (ROBITUSSIN) 100 MG/5ML liquid Take 10 mLs by mouth 3 (three) times daily as needed.     ibuprofen (ADVIL) 600 MG tablet Take 600 mg by mouth every 6 (six) hours as needed.     nitroGLYCERIN (NITROSTAT) 0.4 MG SL tablet Place under the tongue as needed.     oxyCODONE-acetaminophen (PERCOCET/ROXICET) 5-325 MG tablet Take 1-2 tablets by mouth every 6 (six) hours as needed for severe pain. 30 tablet 0   potassium chloride (KLOR-CON) 10 MEQ tablet TAKE 1 TABLET (10 MEQ TOTAL) BY MOUTH DAILY. 30 tablet 3   potassium chloride (KLOR-CON) 10 MEQ tablet Take 1 tablet (10 mEq total) by mouth daily. 90 tablet 3   sacubitril-valsartan (ENTRESTO) 24-26 MG Take 1 tablet by mouth 2 (two) times daily. 180 tablet 3   spironolactone (ALDACTONE) 25 MG tablet Take 1 tablet (25 mg total) by mouth daily. 90 tablet 3   torsemide (DEMADEX) 20 MG tablet Take 80 mg by mouth daily.     traMADol (ULTRAM) 50 MG tablet Take 1 tablet (50 mg total) by mouth every 6 (six) hours as needed for up to 5 days. 20 tablet 0   traZODone (DESYREL) 50 MG tablet Take 50 mg by mouth at bedtime.     Current Facility-Administered Medications on File Prior to Visit  Medication Dose Route Frequency Provider Last Rate Last Admin   acetaminophen (TYLENOL) tablet 650 mg  650 mg Oral Q4H PRN Chandrasekhar, Mahesh A, MD       ondansetron (ZOFRAN) 4 mg in sodium chloride 0.9 % 50 mL IVPB  4 mg Intravenous Q6H PRN Chandrasekhar, Mahesh A, MD        Allergies  Allergen Reactions   Codeine Nausea And Vomiting     Assessment/Plan:  1. CHF - Patient appears euvolemic. Cannot tolerate Entresto due to hypotension. Start losartan 25mg  daily. Patient wishes to switch back to furosemide. Will stop torsemide 80mg  and start furosemide 80mg . Will no choose equivalent dose since we are also adding 10mg  daily today and plan to add spironolactone at next visit. Patient instructed to call if he gains 3  or more pounds overnight or 5 or more pounds in a week or if he starts feeing more short of breath. Follow up in about 2.5 weeks. Continue carvedilol 12.5mg  twice a day and KCL 10 MEQ daily. BMP was drawn today. Medication plan was discussed with Dr. .   2. Tobacco Abuse: Patient desires to quite. I sent in the nicotine gum to his pharmacy to see if insurance will pay for. Educated him to chew until it tingles and  then park. Can chew 1-2 pieces per hr but no more than 1 at a time. We talked about avoiding alcohol as this was a trigger for him.  Thank you,  Olene Floss, Pharm.D, BCPS, CPP [facility]  [address] [place], [place] [address]  Phone: [phone]; Fax: [phone]

## 2021-11-04 NOTE — Patient Instructions (Addendum)
STOP torsemide START furosemide 80mg  daily START losartan 25mg  daily STAR Farxiga 10mg  daily Continue carvedilol 12.5mg  twice a day and potassium chloride 10 MEQ daily  Call me if you gain 3 or more pounds overnight or 5 or more pounds in a week or if you start feeing more short of breath [phone]

## 2021-11-05 LAB — BASIC METABOLIC PANEL
BUN/Creatinine Ratio: 22 — ABNORMAL HIGH (ref 9–20)
BUN: 21 mg/dL (ref 6–24)
CO2: 27 mmol/L (ref 20–29)
Calcium: 9.6 mg/dL (ref 8.7–10.2)
Chloride: 99 mmol/L (ref 96–106)
Creatinine, Ser: 0.95 mg/dL (ref 0.76–1.27)
Glucose: 110 mg/dL — ABNORMAL HIGH (ref 70–99)
Potassium: 3.7 mmol/L (ref 3.5–5.2)
Sodium: 142 mmol/L (ref 134–144)
eGFR: 93 mL/min/{1.73_m2} (ref >59)

## 2021-11-06 NOTE — Research (Signed)
DAPA Subject:  [id number]     Visit:  screening/ visit 1   Date of service: 08-10-2020  Please review this Research note and Cosign; Make any comments as needed.

## 2021-11-06 NOTE — Research (Signed)
DAPA Subject: [id number]      Visit:  3   Date of service: 09-10-2020  Please review this Research note and Cosign; Make any comments as needed.

## 2021-11-06 NOTE — Research (Signed)
DAPA Subject: [id number]      Visit:   4  Date of service: 10-14-2020  Please review this Research note and Cosign; Make any comments as needed.

## 2021-11-06 NOTE — Research (Signed)
DAPA Subject:  [id number]     Visit:   2  Date of service: 08-17-2020  Please review this Research note and Cosign; Make any comments as needed.

## 2021-11-07 ENCOUNTER — Ambulatory Visit: Payer: Medicaid Other

## 2021-11-07 ENCOUNTER — Other Ambulatory Visit: Payer: Self-pay

## 2021-11-08 ENCOUNTER — Telehealth: Payer: Self-pay

## 2021-11-08 ENCOUNTER — Telehealth: Payer: Self-pay | Admitting: Internal Medicine

## 2021-11-08 NOTE — Telephone Encounter (Signed)
Cody Carney 61 year old male is requesting preoperative cardiac evaluation for 8 teeth to be extracted.  He was seen in the clinic on 10/21/2021.  During that time he was stable from a cardiac standpoint.  He was NYHA class III.  His ischemic work-up was negative.  He was continued on his diuresis regimen.  Echocardiogram for his CHF was planned for 3 months.  His PMH also includes aortic atherosclerosis, coronary calcification, TIA, HLD, and tobacco abuse.   May his Plavix be held prior to his procedure?  Thank you for your help.  Please direct your response to CV DIV preop pool.  Thomasene Ripple. Rupa Lagan NP-C    11/08/2021, 10:45 AM [facility] [address] Office [phone] Fax [phone]

## 2021-11-08 NOTE — Telephone Encounter (Signed)
° °  Pre-operative Risk Assessment    Patient Name: Cody Carney  DOB: 14-Oct-1960 MRN: [id number]     Request for Surgical Clearance    Procedure:  Dental Extraction - Amount of Teeth to be Pulled:  8  Date of Surgery:  Clearance TBD                                 Surgeon:  Dr. Sharilyn Sites Surgeon's Group or Practice Name:  [facility] Phone number: [phone] Fax number:  Office does not have a fax. Email clearances to [email or web address]   Type of Clearance Requested:   - Medical  - Pharmacy: Medications to be held TBD by Cardiology    Type of Anesthesia:  Local    Additional requests/questions:    Signed, Jimmey Ralph   11/08/2021, 9:48 AM

## 2021-11-08 NOTE — Telephone Encounter (Signed)
**Note De-Identified Hadessah Grennan Obfuscation** [name] PA started through covermymeds. Key: Zenia Resides

## 2021-11-08 NOTE — Telephone Encounter (Signed)
° °  Primary Cardiologist: Christell Constant, MD  Chart reviewed as part of pre-operative protocol coverage. Given past medical history and time since last visit, based on ACC/AHA guidelines, Cody Carney would be at acceptable risk for the planned procedure without further cardiovascular testing.   He does not require SBE prophylaxis.  His Plavix may be held for 5 days prior to his procedure.  Please resume as soon as hemostasis is achieved.  I will route this recommendation to the requesting party via Epic fax function and remove from pre-op pool.  Please call with questions.  Thomasene Ripple. Kierstan Auer NP-C    11/08/2021, 11:10 AM [facility] [address] Office [phone] Fax [phone]

## 2021-11-10 ENCOUNTER — Ambulatory Visit: Payer: Medicaid Other | Admitting: Orthopedic Surgery

## 2021-11-10 NOTE — Telephone Encounter (Signed)
**Note De-Identified Jeromie Gainor Obfuscation** Letter received Hennessy Bartel fax from [facility] stating that they approved the pts Farxiga PA until further notice.  I have notified [facility] of this approval.

## 2021-11-15 NOTE — Telephone Encounter (Signed)
Requesting dentist office call to check on status of request.

## 2021-11-15 NOTE — Telephone Encounter (Signed)
I am not sure if this clearance was ever emailed to the requesting office. I will email the notes today to requesting office the email that has been provided, see clearance notes.

## 2021-11-21 ENCOUNTER — Telehealth: Payer: Self-pay | Admitting: Pharmacist

## 2021-11-21 NOTE — Telephone Encounter (Signed)
[name] called reporting that the [name] makes the patient feel like he doesn't have any energy. Wants to know if he can cut in half. Advised that he can, although the correct dose for HF is 10mg . He can try cutting in half to see if that makes him feel better. Also reports gaining 6lb in a week- which is more than likely the cause of his fatigue. I have asked him to take an extra 80mg  of furosemide tonight. They have an apt with me tomorrow. Will reassess then and adjust dose accordingly.

## 2021-11-22 ENCOUNTER — Other Ambulatory Visit: Payer: Self-pay

## 2021-11-22 ENCOUNTER — Ambulatory Visit (INDEPENDENT_AMBULATORY_CARE_PROVIDER_SITE_OTHER): Payer: Medicaid Other | Admitting: Pharmacist

## 2021-11-22 VITALS — BP 120/70 | HR 78 | Wt 225.8 lb

## 2021-11-22 DIAGNOSIS — I5020 Unspecified systolic (congestive) heart failure: Secondary | ICD-10-CM | POA: Diagnosis not present

## 2021-11-22 MED ORDER — FUROSEMIDE 80 MG PO TABS: 120.0000 mg | ORAL_TABLET | Freq: Every day | ORAL | 3 refills | Status: DC

## 2021-11-22 NOTE — Progress Notes (Signed)
Patient ID: Cody Carney                 DOB: 04-Mar-1961                      MRN: [id number]     HPI: Cody Carney is a 61 y.o. male referred by Dr. Izora Ribas to pharmacy clinic for HF medication management. PMH is significant for HTN, CAD, TIA, tobacco abuse. Most recent LVEF 25-30% on 08/09/20. He is enrolled in the DAPA TIMI 68 trial, but done with the study drug. Was seen back in 2022 due to hypervolemia and GDMT titration. Eventually admitted due to volume overload. Then lost to follow up due to social issues. Has since been approved for medicaid.   He was seen by Dr. Izora Ribas on 10/21/21. Unclear exactly what he was taking. Was seen by PharmD 1/27. Sherryll Burger was stopped completely due to hypotension. He was started on losartan 25mg  daily and Farxiga 10mg  daily. He was switched from torsemide 80mg  daily to furosemide 80mg  daily per patient request.   Patient's girlfriend called the office yesterday reporting that makes the patient feel like he doesn't have any energy. Also reporting weight gain (6lb in a week). Advised to give pt an extra 80mg  of furosemide today and we will reassess at OV tomorrow.  He presents to clinic today accompanied by his girlfriend . He reports that his weight has gone from 213 when he saw me last about 3 weeks ago to 225 today. Reports swelling in his abdomen and hands. Did improve some with 160mg  of furosemide yesterday and today. Feels tired. Thinks taking Farxiga 5mg  daily makes him feel better. States blood pressure is no more than 125/70. Feels dizzy/lightheaded almost every day. SOB when walking up hill and sometime when laying flat. Is not working. Waiting on his disability. He and his girlfriend are living with a friend. This friend has 6 dogs and is not the cleanest. Is a stressful situation for him. Doesn't want to have the knee replacement surgery until he has his own place. Is going to try quitting smoking again tomorrow. Was able to get the  nicotine gum.  Feels as though all these medications are affecting his sex drive.  Current CHF meds: Carvedilol 12.5 mg twice a day, potassium 10 MEQ daily, furosemide 80mg  daily, losartan 25mg  daily, Farxiga 10mg  daily Previously tried: Entresto (hypotension) BP goal: <130/80  Family History: History of coronary artery disease notable for uncles have had MI. History of heart failure notable for no members. History of arrhythmia notable for uncle needing pacemaker. No history of bicuspid aortic valve or aortic aneurysm or dissection  Social History: quite for 6 days then started again. < 1 ppd + ETOH  Diet: chicken, pork or beef, beans, BP, going to start buying frozen vegetables  Exercise: goes for a walk daily  Home BP readings: 125/70  Wt Readings from Last 3 Encounters:  11/01/21 220 lb (99.8 kg)  10/25/21 220 lb (99.8 kg)  10/21/21 220 lb (99.8 kg)   BP Readings from Last 3 Encounters:  11/04/21 120/60  11/01/21 124/73  10/25/21 109/73   Pulse Readings from Last 3 Encounters:  11/04/21 74  11/01/21 82  10/25/21 92    Renal function: CrCl cannot be calculated (Unknown ideal weight.).  Past Medical History:  Diagnosis Date   Acute exacerbation of CHF (congestive heart failure) (HCC) 08/08/2020   Aortic atherosclerosis (HCC) 10/12/2020   CHF (congestive heart failure) (HCC)  COPD (chronic obstructive pulmonary disease) (HCC)    Coronary artery calcification 10/12/2020   Essential hypertension 08/08/2020   HFrEF (heart failure with reduced ejection fraction) (HCC) 11/24/2020   Hypertension    Ischemic chest pain (HCC) 08/08/2020   Mixed hyperlipidemia 10/12/2020   Tobacco abuse 08/08/2020    Current Outpatient Medications on File Prior to Visit  Medication Sig Dispense Refill   acetaminophen (TYLENOL) 325 MG tablet Take by mouth as needed.     aspirin 81 MG EC tablet Take 162 mg by mouth daily.     atorvastatin (LIPITOR) 80 MG tablet Take 80 mg by mouth at  bedtime.     carvedilol (COREG) 12.5 MG tablet Take 12.5 mg by mouth 2 (two) times daily with a meal.     cetirizine (ZYRTEC) 10 MG tablet Take 10 mg by mouth daily.     clopidogrel (PLAVIX) 75 MG tablet Take 75 mg by mouth daily.     dapagliflozin propanediol (FARXIGA) 10 MG TABS tablet Take 1 tablet (10 mg total) by mouth daily before breakfast. 90 tablet 3   furosemide (LASIX) 80 MG tablet Take 1 tablet (80 mg total) by mouth daily. 90 tablet 3   losartan (COZAAR) 25 MG tablet Take 1 tablet (25 mg total) by mouth daily. 90 tablet 3   nicotine polacrilex (EQ NICOTINE POLACRILEX) 4 MG gum Take 1 each (4 mg total) by mouth as needed for smoking cessation. 100 tablet 0   nitroGLYCERIN (NITROSTAT) 0.4 MG SL tablet Place under the tongue as needed.     potassium chloride (KLOR-CON) 10 MEQ tablet Take 1 tablet (10 mEq total) by mouth daily. 90 tablet 3   traZODone (DESYREL) 100 MG tablet Take 100 mg by mouth at bedtime.     Current Facility-Administered Medications on File Prior to Visit  Medication Dose Route Frequency Provider Last Rate Last Admin   acetaminophen (TYLENOL) tablet 650 mg  650 mg Oral Q4H PRN Chandrasekhar, Mahesh A, MD       ondansetron (ZOFRAN) 4 mg in sodium chloride 0.9 % 50 mL IVPB  4 mg Intravenous Q6H PRN Chandrasekhar, Mahesh A, MD        Allergies  Allergen Reactions   Codeine Nausea And Vomiting     Assessment/Plan:  1. CHF - Patient is hypervolemic. Will have him take furosemide 160mg  daily for 2 more days (4 days total) and then increase daily dose to 120mg  daily. Will repeat BMP on 2/21. Continue losartan 25mg  daily, farxiga 5mg  daily (advised that I think his fluid overload is making his feel tired not the farxiga but that he could continue farxiga 5mg  daily for a little while and re challenge again with 10mg ), carvedilol 12.5mg  daily. Will hold off on adding spironolactone or increase losartan at this time due to reports of lightheaded. Follow up in clinic in 3  weeks.   2. Tobacco Abuse: Patient to attempt quitting again tomorrow with the help of the nicotine gum.  Thank you,  , Pharm.D, BCPS, CPP [facility]  [address], [place],   Phone: [phone]; Fax: [phone]

## 2021-11-22 NOTE — Patient Instructions (Addendum)
Take 2 tablets of furosemide (160mg ) daily for the next 2 days then decrease to 1.5 tablets (120mg  daily).  Continue losartan 25mg  daily, carvedilol 12.5mg  daily, potassium 10 MEQ daily and Farxiga 5mg  daily Call me at [phone] with any problems Labs on 2/21

## 2021-11-29 ENCOUNTER — Encounter: Payer: Self-pay | Admitting: Orthopaedic Surgery

## 2021-11-29 ENCOUNTER — Other Ambulatory Visit: Payer: Self-pay

## 2021-11-29 ENCOUNTER — Other Ambulatory Visit: Payer: Medicaid Other

## 2021-11-29 ENCOUNTER — Ambulatory Visit (INDEPENDENT_AMBULATORY_CARE_PROVIDER_SITE_OTHER): Payer: Medicaid Other | Admitting: Orthopaedic Surgery

## 2021-11-29 DIAGNOSIS — S83005D Unspecified dislocation of left patella, subsequent encounter: Secondary | ICD-10-CM | POA: Diagnosis not present

## 2021-12-02 ENCOUNTER — Telehealth: Payer: Self-pay | Admitting: Pharmacist

## 2021-12-02 MED ORDER — FUROSEMIDE 80 MG PO TABS: 160.0000 mg | ORAL_TABLET | Freq: Every day | ORAL | 3 refills | Status: AC

## 2021-12-02 NOTE — Telephone Encounter (Signed)
Called pt bc he missed his lab apt this week. Spoke with pt and his girlfriend. They stated his ortho apt ran over. Also mentions that 120mg  of furosemide was not getting enough fluid off so he has been taking 160mg  daily and his weight has steadied off. Advised it was ok to continue with 160mg  daily. Will check labs on 3/1.

## 2021-12-05 DIAGNOSIS — S83005D Unspecified dislocation of left patella, subsequent encounter: Secondary | ICD-10-CM | POA: Insufficient documentation

## 2021-12-05 NOTE — Progress Notes (Signed)
Office Visit Note   Patient: Cody Carney           Date of Birth: Oct 07, 1961           MRN: [id number] Visit Date: 11/29/2021              Requested by: Barnetta Chapel, NP [address] [place],  [place] [address] PCP: Barnetta Chapel, NP   Assessment & Plan: Visit Diagnoses:  1. Patellar dislocation, left, subsequent encounter     Plan: Patient continue quad rehab.  Once his knee is doing better he is out of the cast thumb sealed up we could use either a walker or crutches after hardware removal of the tibia he can return for discussion of scheduling left tibial nail removal and interlock removal.  With the Herzog bend in the now this may be a Synthes nail.  Follow-Up Instructions: No follow-ups on file.   Orders:  No orders of the defined types were placed in this encounter.  No orders of the defined types were placed in this encounter.     Procedures: No procedures performed   Clinical Data: No additional findings.   Subjective: Chief Complaint  Patient presents with   Left Knee - Follow-up    DOI 10/17/2021    HPI 61 year old male follow-up injury from fall 10/17/2021 when he was in the yard blacked out.  Injured his thumb which is being treated by Dr. Tempie Donning in a cast.  Patient's continued to have pain in his knee feels like it pops when he rolls out of bed.  Patient had old tibial nail in his knee osteoarthritis.  Prior to his patellar subluxation dislocation he was pending hardware removal tibial nail with broken interlock screw so that he could later have total knee arthroplasty.  Patient used ibuprofen and Tylenol without relief.  Review of Systems all systems noncontributory HPI.   Objective: Vital Signs: BP (!) 142/91    Pulse 87    Ht 5\' 8"  (1.727 m)    Wt 225 lb (102.1 kg)    BMI 34.21 kg/m   Physical Exam Constitutional:      Appearance: He is well-developed.  HENT:     Head: Normocephalic and atraumatic.     Right Ear: External ear normal.      Left Ear: External ear normal.  Eyes:     Pupils: Pupils are equal, round, and reactive to light.  Neck:     Thyroid: No thyromegaly.     Trachea: No tracheal deviation.  Cardiovascular:     Rate and Rhythm: Normal rate.  Pulmonary:     Effort: Pulmonary effort is normal.     Breath sounds: No wheezing.  Abdominal:     General: Bowel sounds are normal.     Palpations: Abdomen is soft.  Musculoskeletal:     Cervical back: Neck supple.  Skin:    General: Skin is warm and dry.     Capillary Refill: Capillary refill takes less than 2 seconds.  Neurological:     Mental Status: He is alert and oriented to person, place, and time.  Psychiatric:        Behavior: Behavior normal.        Thought Content: Thought content normal.        Judgment: Judgment normal.    Ortho Exam minimal swelling left knee.  Is able to straight leg raise with slight extension lag.  Distal pulses are intact negative logroll.  Distal interlock  screw is broken by x-rays palpable medial distal tibia.  Crepitus with knee range of motion.  Specialty Comments:  No specialty comments available.  Imaging: No results found.   PMFS History: Patient Active Problem List   Diagnosis Date Noted   Patellar dislocation, left, subsequent encounter 12/05/2021   Bennett's fracture of base of metacarpal bone of right thumb 11/03/2021   Clavicle fracture 06/28/2021   Unilateral primary osteoarthritis, left knee 05/30/2021   HFrEF (heart failure with reduced ejection fraction) ([facility]) 11/24/2020   Aortic atherosclerosis ([facility]) 10/12/2020   Coronary artery calcification 10/12/2020   Mixed hyperlipidemia 10/12/2020   COPD (chronic obstructive pulmonary disease) ([facility]) 08/08/2020   Essential hypertension 08/08/2020   Tobacco abuse 08/08/2020   Past Medical History:  Diagnosis Date   Acute exacerbation of CHF (congestive heart failure) ([place]) 08/08/2020   Aortic atherosclerosis ([facility]) 10/12/2020   CHF (congestive heart failure)  ([facility])    COPD (chronic obstructive pulmonary disease) ([place])    Coronary artery calcification 10/12/2020   Essential hypertension 08/08/2020   HFrEF (heart failure with reduced ejection fraction) ([facility]) 11/24/2020   Hypertension    Ischemic chest pain ([facility]) 08/08/2020   Mixed hyperlipidemia 10/12/2020   Tobacco abuse 08/08/2020    No family history on file.  Past Surgical History:  Procedure Laterality Date   LEG SURGERY     RIGHT/LEFT HEART CATH AND CORONARY ANGIOGRAPHY N/A 11/26/2020   Procedure: RIGHT/LEFT HEART CATH AND CORONARY ANGIOGRAPHY;  Surgeon: Troy Sine, MD;  Location: [facility];  Service: Cardiovascular;  Laterality: N/A;   Social History   Occupational History   Not on file  Tobacco Use   Smoking status: Every Day    Packs/day: 0.25    Years: 39.00    Pack years: 9.75    Types: Cigarettes   Smokeless tobacco: Never   Tobacco comments:    encouraged smoking cessation, offered nicotine patch upon DC--relayed msg to primary team  Vaping Use   Vaping Use: Never used  Substance and Sexual Activity   Alcohol use: Yes    Alcohol/week: 2.0 standard drinks    Types: 2 Cans of beer per week    Comment: per pt statement 2 cans beer on weekend with friends occassionally   Drug use: Not Currently    Types: Marijuana    Comment: Hasn't smoked in a "long time"   Sexual activity: Not Currently

## 2021-12-06 ENCOUNTER — Ambulatory Visit (INDEPENDENT_AMBULATORY_CARE_PROVIDER_SITE_OTHER): Payer: Medicaid Other

## 2021-12-06 ENCOUNTER — Ambulatory Visit (INDEPENDENT_AMBULATORY_CARE_PROVIDER_SITE_OTHER): Payer: Medicaid Other | Admitting: Orthopedic Surgery

## 2021-12-06 ENCOUNTER — Other Ambulatory Visit: Payer: Self-pay

## 2021-12-06 DIAGNOSIS — S62211A Bennett's fracture, right hand, initial encounter for closed fracture: Secondary | ICD-10-CM

## 2021-12-06 NOTE — Progress Notes (Signed)
Office Visit Note   Patient: Cody Carney           Date of Birth: Aug 17, 1961           MRN: [id number] Visit Date: 12/06/2021              Requested by: Eber Jones, NP [address] [place],  [place] [address] PCP: Eber Jones, NP   Assessment & Plan: Visit Diagnoses:  1. Closed Bennett's fracture of right thumb, initial encounter     Plan: Patient is now approximately 7 weeks out from injury and has been in a cast since his last visit on 1/24.  X-rays today show a malunited Bennett's fracture in the setting of basilar thumb arthritis. He took his own cast off on Saturday.  He has no pain today.  He is happy with his result and has less pain than he had before his injury.  He can see me back as needed.   Follow-Up Instructions: No follow-ups on file.   Orders:  Orders Placed This Encounter  Procedures   XR Finger Thumb Left   No orders of the defined types were placed in this encounter.     Procedures: No procedures performed   Clinical Data: No additional findings.   Subjective: Chief Complaint  Patient presents with   Left Thumb - Follow-up, Fracture    This is a 61 year old right-hand-dominant male who presents for follow up of a right Bennett thumb fracture.  He is about 7 weeks out from his injury.  He has been in a cast for over 4 weeks.  He has no pain today.  His thumb feels better than before his injury.  He has unchanged thumb ROM from before injury.  He is overall happy with he result so far.    Review of Systems   Objective: Vital Signs: There were no vitals taken for this visit.  Physical Exam Constitutional:      Appearance: Normal appearance.  Cardiovascular:     Rate and Rhythm: Normal rate.     Pulses: Normal pulses.  Pulmonary:     Effort: Pulmonary effort is normal.  Skin:    General: Skin is warm and dry.     Capillary Refill: Capillary refill takes less than 2 seconds.  Neurological:     Mental Status: He is alert.     Right Hand Exam   Tenderness  The patient is experiencing no tenderness.   Other  Erythema: absent Sensation: normal Pulse: present  Comments:  No pain w/ [facility] grind test.  Thumb opposition to small finger DIP joint.  No swelling.       Specialty Comments:  No specialty comments available.  Imaging: No results found.   PMFS History: Patient Active Problem List   Diagnosis Date Noted   Patellar dislocation, left, subsequent encounter 12/05/2021   Bennett's fracture of base of metacarpal bone of right thumb 11/03/2021   Clavicle fracture 06/28/2021   Unilateral primary osteoarthritis, left knee 05/30/2021   HFrEF (heart failure with reduced ejection fraction) ([facility]) 11/24/2020   Aortic atherosclerosis ([facility]) 10/12/2020   Coronary artery calcification 10/12/2020   Mixed hyperlipidemia 10/12/2020   COPD (chronic obstructive pulmonary disease) ([facility]) 08/08/2020   Essential hypertension 08/08/2020   Tobacco abuse 08/08/2020   Past Medical History:  Diagnosis Date   Acute exacerbation of CHF (congestive heart failure) ([facility]) 08/08/2020   Aortic atherosclerosis ([facility]) 10/12/2020   CHF (congestive heart failure) ([facility])    COPD (  chronic obstructive pulmonary disease) ([facility])    Coronary artery calcification 10/12/2020   Essential hypertension 08/08/2020   HFrEF (heart failure with reduced ejection fraction) ([facility]) 11/24/2020   Hypertension    Ischemic chest pain ([facility]) 08/08/2020   Mixed hyperlipidemia 10/12/2020   Tobacco abuse 08/08/2020    No family history on file.  Past Surgical History:  Procedure Laterality Date   LEG SURGERY     RIGHT/LEFT HEART CATH AND CORONARY ANGIOGRAPHY N/A 11/26/2020   Procedure: RIGHT/LEFT HEART CATH AND CORONARY ANGIOGRAPHY;  Surgeon: Lennette Bihari, MD;  Location: [facility];  Service: Cardiovascular;  Laterality: N/A;   Social History   Occupational History   Not on file  Tobacco Use   Smoking status: Every Day    Packs/day: 0.25     Years: 39.00    Pack years: 9.75    Types: Cigarettes   Smokeless tobacco: Never   Tobacco comments:    encouraged smoking cessation, offered nicotine patch upon DC--relayed msg to primary team  Vaping Use   Vaping Use: Never used  Substance and Sexual Activity   Alcohol use: Yes    Alcohol/week: 2.0 standard drinks    Types: 2 Cans of beer per week    Comment: per pt statement 2 cans beer on weekend with friends occassionally   Drug use: Not Currently    Types: Marijuana    Comment: Hasn't smoked in a "long time"   Sexual activity: Not Currently

## 2021-12-07 ENCOUNTER — Other Ambulatory Visit: Payer: Medicaid Other | Admitting: *Deleted

## 2021-12-07 DIAGNOSIS — I5020 Unspecified systolic (congestive) heart failure: Secondary | ICD-10-CM

## 2021-12-07 LAB — BASIC METABOLIC PANEL
BUN/Creatinine Ratio: 17 (ref 9–20)
BUN: 16 mg/dL (ref 6–24)
CO2: 28 mmol/L (ref 20–29)
Calcium: 10 mg/dL (ref 8.7–10.2)
Chloride: 93 mmol/L — ABNORMAL LOW (ref 96–106)
Creatinine, Ser: 0.95 mg/dL (ref 0.76–1.27)
Glucose: 119 mg/dL — ABNORMAL HIGH (ref 70–99)
Potassium: 4 mmol/L (ref 3.5–5.2)
Sodium: 139 mmol/L (ref 134–144)
eGFR: 93 mL/min/{1.73_m2} (ref >59)

## 2021-12-15 ENCOUNTER — Ambulatory Visit (INDEPENDENT_AMBULATORY_CARE_PROVIDER_SITE_OTHER): Payer: Medicaid Other | Admitting: Pharmacist

## 2021-12-15 ENCOUNTER — Other Ambulatory Visit: Payer: Self-pay

## 2021-12-15 VITALS — BP 118/70 | HR 76 | Wt 224.0 lb

## 2021-12-15 DIAGNOSIS — I5020 Unspecified systolic (congestive) heart failure: Secondary | ICD-10-CM

## 2021-12-15 NOTE — Patient Instructions (Addendum)
Start taking spironolactone 12.5mg  daily ?Continue carvedilol 12.5 mg twice a day, potassium 10 MEQ daily, furosemide 160mg  daily, losartan 25mg  daily, Farxiga 5mg  daily ?Continue checking weight and blood pressure at home ?

## 2021-12-15 NOTE — Progress Notes (Signed)
Patient ID: Cody Carney                 DOB: [birth year]                      MRN: [id number] ? ? ? ? ?HPI: ?Cody Carney is a 61 y.o. male referred by Dr. Izora Ribas to pharmacy clinic for HF medication management. PMH is significant for HTN, CAD, TIA, tobacco abuse. Most recent LVEF 25-30% on 08/09/20. He is enrolled in the DAP[employer] TIMI 68 trial, but done with the study drug. Was seen back in 2022 due to hypervolemia and GDMT titration. Eventually admitted due to volume overload. Then lost to follow up due to social issues. Has since been approved for medicaid.  ? ?He was seen by Dr. Izora Ribas on 10/21/21. Unclear exactly what he was taking. ?Was seen by PharmD 1/27. Sherryll Burger was stopped completely due to hypotension. He was started on losartan 25mg  daily and Farxiga 10mg  daily. He was switched from torsemide 80mg  daily to furosemide 80mg  daily per patient request.  ? ?Patient's girlfriend called the office reporting that makes the patient feel like he doesn't have any energy. Also reported a weight gain (6lb in a week). Patient was seen in clinic. He was instructed to take furosemide 160mg  daily for 2 more days (4 days total) and then increase daily dose to 120mg  daily. I spoke with Christina on 2/24. She stated that furosemide 120mg  was not getting enough fluid off so she has increased to 160mg  daily. BMP on 3/1 was stable. Furosemide 160mg  daily was continued. ? ?He presents to clinic today accompanied by his girlfriend . He reports that his weight will increase 5-7lb overnight and then come back down over the next 2-3 days. Reports mild swelling in his abdomen. Feels tired. Does not sleep well because of chronic pain. Living situation is still stressful. Moved in with friend to help him out. Friend binge drinks. Has 6 dogs.  Does get food stamps and girlfriend does have an income. They report ok on food. Waiting for his disability to be approved. Has his name on the low income housing list. Still  smoking. Tries to get out and do things, but then is tired and hurts for days afterwards. ? ?Current CHF meds: Carvedilol 12.5 mg twice a day, potassium 10 MEQ daily, furosemide 160mg  daily, losartan 25mg  daily, Farxiga 5mg  daily ?Previously tried: Entresto (hypotension) ?BP goal: <130/80 ? ?Family History: History of coronary artery disease notable for uncles have had MI. ?History of heart failure notable for no members. ?History of arrhythmia notable for uncle needing pacemaker. ?No history of bicuspid aortic valve or aortic aneurysm or dissection ? ?Social History: quite for 6 days then started again. < 1 ppd + ETOH ? ?Diet: chicken, pork or beef, beans, BP, going to start buying frozen vegetables ? ?Exercise: goes for a walk daily ? ?Home BP readings: 117-120/<80 ? ?Wt Readings from Last 3 Encounters:  ?11/29/21 225 lb (102.1 kg)  ?11/22/21 225 lb 12.8 oz (102.4 kg)  ?11/01/21 220 lb (99.8 kg)  ? ?BP Readings from Last 3 Encounters:  ?11/29/21 (!) 142/91  ?11/22/21 120/70  ?11/04/21 120/60  ? ?Pulse Readings from Last 3 Encounters:  ?11/29/21 87  ?11/22/21 78  ?11/04/21 74  ? ? ?Renal function: ?CrCl cannot be calculated (Unknown ideal weight.). ? ?Past Medical History:  ?Diagnosis Date  ? Acute exacerbation of CHF (congestive heart failure) ([facility]) 08/08/2020  ? Aortic atherosclerosis ([facility]) 10/12/2020  ?  CHF (congestive heart failure) ([facility])   ? COPD (chronic obstructive pulmonary disease) ([facility])   ? Coronary artery calcification 10/12/2020  ? Essential hypertension 08/08/2020  ? HFrEF (heart failure with reduced ejection fraction) ([facility]) 11/24/2020  ? Hypertension   ? Ischemic chest pain ([facility]) 08/08/2020  ? Mixed hyperlipidemia 10/12/2020  ? Tobacco abuse 08/08/2020  ? ? ?Current Outpatient Medications on File Prior to Visit  ?Medication Sig Dispense Refill  ? acetaminophen (TYLENOL) 325 MG tablet Take by mouth as needed.    ? aspirin 81 MG EC tablet Take 162 mg by mouth daily.    ? atorvastatin (LIPITOR) 80 MG tablet  Take 80 mg by mouth at bedtime.    ? carvedilol (COREG) 12.5 MG tablet Take 12.5 mg by mouth 2 (two) times daily with a meal.    ? cetirizine (ZYRTEC) 10 MG tablet Take 10 mg by mouth daily.    ? clopidogrel (PLAVIX) 75 MG tablet Take 75 mg by mouth daily.    ? dapagliflozin propanediol (FARXIGA) 10 MG TABS tablet Take 1 tablet (10 mg total) by mouth daily before breakfast. (Patient taking differently: Take 5 mg by mouth daily before breakfast.) 90 tablet 3  ? furosemide (LASIX) 80 MG tablet Take 2 tablets (160 mg total) by mouth daily. 180 tablet 3  ? losartan (COZAAR) 25 MG tablet Take 1 tablet (25 mg total) by mouth daily. 90 tablet 3  ? nicotine polacrilex (EQ NICOTINE POLACRILEX) 4 MG gum Take 1 each (4 mg total) by mouth as needed for smoking cessation. 100 tablet 0  ? nitroGLYCERIN (NITROSTAT) 0.4 MG SL tablet Place under the tongue as needed.    ? potassium chloride (KLOR-CON) 10 MEQ tablet Take 1 tablet (10 mEq total) by mouth daily. 90 tablet 3  ? traZODone (DESYREL) 100 MG tablet Take 100 mg by mouth at bedtime.    ? ?Current Facility-Administered Medications on File Prior to Visit  ?Medication Dose Route Frequency Provider Last Rate Last Admin  ? acetaminophen (TYLENOL) tablet 650 mg  650 mg Oral Q4H PRN Chandrasekhar, Mahesh A, MD      ? ondansetron (ZOFRAN) 4 mg in sodium chloride 0.9 % 50 mL IVPB  4 mg Intravenous Q6H PRN Chandrasekhar, Mahesh A, MD      ? ? ?Allergies  ?Allergen Reactions  ? Codeine Nausea And Vomiting  ? ? ? ?Assessment/Plan: ? ?1. CHF - Patient weight is stable from last visit. Continue furosemide 160mg  daily. Will add spironolactone 12.5mg  daily. Continue carvedilol 12.5 mg twice a day, potassium 10 MEQ daily, furosemide 160mg  daily, losartan 25mg  daily, Farxiga 5mg  daily. Will need to address increasing Farxiga to 10mg  at next visit. Follow up in ~ 2 weeks. Will need BMP at hat time.  ? ?2. Tobacco Abuse: Patient wants to quite but has not attempted to quite again. Reports stress  being a barrier. ? ?Thank you, ? ? , Pharm.D, BCPS, CPP ?[facility]  ?[address], [place],   ?Phone: [phone]; Fax: [phone]  ? ? ?

## 2021-12-21 ENCOUNTER — Telehealth: Payer: Self-pay | Admitting: *Deleted

## 2021-12-21 NOTE — Telephone Encounter (Signed)
? ?  Pre-operative Risk Assessment  ?  ?Patient Name: Cody Carney  ?DOB: 05-22-1961 ?MRN: [id number]  ? ?  ? ?Request for Surgical Clearance   ? ?Procedure:   L TIBIA NAIL / SCREW REMOVAL ? ?Date of Surgery:  Clearance TBD                              ?   ?Surgeon:   ?Surgeon's Group or Practice Name:  [facility]  ?Phone number:  [phone] ?Fax number:  [phone] ?  ?Type of Clearance Requested:   ?- Pharmacy:  Hold Clopidogrel (Plavix) NOT INDICATED, NEEDS INSTRUCTIONS ?  ?Type of Anesthesia:  General  VS. Spinal ?  ?Additional requests/questions:   ? ?Signed, ?Jeanann Lewandowsky   ?12/21/2021, 11:21 AM  ? ? ?

## 2021-12-22 NOTE — Telephone Encounter (Signed)
Pre-op covering staff, patient will need a telehealth visit to complete pre-op evaluation. Can you please call and consent patient for this and add to an APP pre-op schedule? ? ?Dr. Izora Ribas previously gave the OK to hold Plavix for 5 days before multiple teeth extraction in 10/2021. We should be able to use this same recommendation.  ? ?Thank you! ?Corrin Parker, PA-C ?12/22/2021 5:58 PM ? ?

## 2021-12-23 ENCOUNTER — Telehealth: Payer: Self-pay | Admitting: *Deleted

## 2021-12-23 NOTE — Telephone Encounter (Signed)
?  Patient Consent for Virtual Visit  ? ? ?   ? ?Cody Carney has provided verbal consent on 12/23/2021 for a virtual visit (video or telephone). ? ? ?CONSENT FOR VIRTUAL VISIT FOR:  Cody Carney  ?By participating in this virtual visit I agree to the following: ? ?I hereby voluntarily request, consent and authorize [facility] and its employed or contracted physicians, physician assistants, nurse practitioners or other licensed health care professionals (the Practitioner), to provide me with telemedicine health care services (the ?Services") as deemed necessary by the treating Practitioner. I acknowledge and consent to receive the Services by the Practitioner via telemedicine. I understand that the telemedicine visit will involve communicating with the Practitioner through live audiovisual communication technology and the disclosure of certain medical information by electronic transmission. I acknowledge that I have been given the opportunity to request an in-person assessment or other available alternative prior to the telemedicine visit and am voluntarily participating in the telemedicine visit. ? ?I understand that I have the right to withhold or withdraw my consent to the use of telemedicine in the course of my care at any time, without affecting my right to future care or treatment, and that the Practitioner or I may terminate the telemedicine visit at any time. I understand that I have the right to inspect all information obtained and/or recorded in the course of the telemedicine visit and may receive copies of available information for a reasonable fee.  I understand that some of the potential risks of receiving the Services via telemedicine include:  ?Delay or interruption in medical evaluation due to technological equipment failure or disruption; ?Information transmitted may not be sufficient (e.g. poor resolution of images) to allow for appropriate medical decision making by the Practitioner; and/or  ?In  rare instances, security protocols could fail, causing a breach of personal health information. ? ?Furthermore, I acknowledge that it is my responsibility to provide information about my medical history, conditions and care that is complete and accurate to the best of my ability. I acknowledge that Practitioner's advice, recommendations, and/or decision may be based on factors not within their control, such as incomplete or inaccurate data provided by me or distortions of diagnostic images or specimens that may result from electronic transmissions. I understand that the practice of medicine is not an exact science and that Practitioner makes no warranties or guarantees regarding treatment outcomes. I acknowledge that a copy of this consent can be made available to me via my patient portal [facility] MyChart), or I can request a printed copy by calling the office of [facility].   ? ?I understand that my insurance will be billed for this visit.  ? ?I have read or had this consent read to me. ?I understand the contents of this consent, which adequately explains the benefits and risks of the Services being provided via telemedicine.  ?I have been provided ample opportunity to ask questions regarding this consent and the Services and have had my questions answered to my satisfaction. ?I give my informed consent for the services to be provided through the use of telemedicine in my medical care ? ? ? ?

## 2021-12-23 NOTE — Telephone Encounter (Signed)
Pt agreeable to plan of care for tele pre op appt 12/26/21 @ 3 pm. Med rec and consent are done. ?

## 2021-12-26 ENCOUNTER — Encounter: Payer: Self-pay | Admitting: Physician Assistant

## 2021-12-26 ENCOUNTER — Other Ambulatory Visit: Payer: Self-pay

## 2021-12-26 ENCOUNTER — Ambulatory Visit (INDEPENDENT_AMBULATORY_CARE_PROVIDER_SITE_OTHER): Payer: Medicaid Other | Admitting: Physician Assistant

## 2021-12-26 DIAGNOSIS — Z01810 Encounter for preprocedural cardiovascular examination: Secondary | ICD-10-CM

## 2021-12-26 NOTE — Progress Notes (Signed)
? ?Virtual Visit via Telephone Note  ? ?This visit type was conducted due to national recommendations for restrictions regarding the COVID-19 Pandemic (e.g. social distancing) in an effort to limit this patient's exposure and mitigate transmission in our community.  Due to his co-morbid illnesses, this patient is at least at moderate risk for complications without adequate follow up.  This format is felt to be most appropriate for this patient at this time.  The patient did not have access to video technology/had technical difficulties with video requiring transitioning to audio format only (telephone).  All issues noted in this document were discussed and addressed.  No physical exam could be performed with this format.  Please refer to the patient's chart for his  consent to telehealth for [facility]. ?Evaluation Performed:  Preoperative cardiovascular risk assessment ? ?This visit type was conducted due to national recommendations for restrictions regarding the COVID-19 Pandemic (e.g. social distancing).  This format is felt to be most appropriate for this patient at this time.  All issues noted in this document were discussed and addressed.  No physical exam was performed (except for noted visual exam findings with Video Visits).  Please refer to the patient's chart (MyChart message for video visits and phone note for telephone visits) for the patient's consent to telehealth for [facility]. ?_____________  ? ?Date:  12/26/2021  ? ?Patient ID:  Cody Carney, DOB [birth year], MRN [id number] ?Patient Location:  ?Home ?Provider location:   ?Office ? ?Primary Care Provider:  Eber Jones, NP ?Primary Cardiologist:  Christell Constant, MD ? ?Chief Complaint  ?  ?61 y.o. y/o male with a h/o aortic atherosclerosis, coronary calcifications on CT, hyperlipidemia, history of TIA, chronic systolic heart failure, who is pending left tibia nail and screw removal, and presents today for telephonic preoperative  cardiovascular risk assessment. ? ?Past Medical History  ?  ?Past Medical History:  ?Diagnosis Date  ? Acute exacerbation of CHF (congestive heart failure) (HCC) 08/08/2020  ? Aortic atherosclerosis (HCC) 10/12/2020  ? CHF (congestive heart failure) (HCC)   ? COPD (chronic obstructive pulmonary disease) (HCC)   ? Coronary artery calcification 10/12/2020  ? Essential hypertension 08/08/2020  ? HFrEF (heart failure with reduced ejection fraction) (HCC) 11/24/2020  ? Hypertension   ? Ischemic chest pain (HCC) 08/08/2020  ? Mixed hyperlipidemia 10/12/2020  ? Tobacco abuse 08/08/2020  ? ?Past Surgical History:  ?Procedure Laterality Date  ? LEG SURGERY    ? RIGHT/LEFT HEART CATH AND CORONARY ANGIOGRAPHY N/A 11/26/2020  ? Procedure: RIGHT/LEFT HEART CATH AND CORONARY ANGIOGRAPHY;  Surgeon: Lennette Bihari, MD;  Location: [facility] INVASIVE CV LAB;  Service: Cardiovascular;  Laterality: N/A;  ? ? ?Allergies ? ?Allergies  ?Allergen Reactions  ? Codeine Nausea And Vomiting  ? ? ?History of Present Illness  ?  ?Cody Carney is a 61 y.o. male who presents via Web designer for a telehealth visit today.  Pt was last seen in cardiology clinic on 10/21/2021 by Dr. Izora Ribas.  At that time Cody Carney was doing well but had doubled his lasix at home intermittently.  he is now pending left tibia nail and screw removal.  Since his last visit, he has had stable volume and BP. He thinks he is on a good medication regimen. He has a nonischemic cardiomyopathy by heart cath 11/2020. He is having no chest pain. His mobility is limited by knee and back pain. He is sedentary and is unable to complete 4.0 METS. He does not have angina  with his minimal activity.  ? ? ?Home Medications  ?  ?Prior to Admission medications   ?Medication Sig Start Date End Date Taking? Authorizing Provider  ?acetaminophen (TYLENOL) 325 MG tablet Take by mouth as needed. 06/13/21   [provider]  ?aspirin 81 MG EC tablet Take 162 mg by mouth daily.     [provider]  ?atorvastatin (LIPITOR) 80 MG tablet Take 80 mg by mouth at bedtime.    [provider]  ?carvedilol (COREG) 12.5 MG tablet Take 12.5 mg by mouth 2 (two) times daily with a meal.    [provider]  ?cetirizine (ZYRTEC) 10 MG tablet Take 10 mg by mouth daily.    [provider]  ?clopidogrel (PLAVIX) 75 MG tablet Take 75 mg by mouth daily. 10/03/21   [provider]  ?dapagliflozin propanediol (FARXIGA) 10 MG TABS tablet Take 1 tablet (10 mg total) by mouth daily before breakfast. ?Patient taking differently: Take 5 mg by mouth daily before breakfast. 11/04/21   Riley Lam A, MD  ?furosemide (LASIX) 80 MG tablet Take 2 tablets (160 mg total) by mouth daily. 12/02/21   Christell Constant, MD  ?losartan (COZAAR) 25 MG tablet Take 1 tablet (25 mg total) by mouth daily. 11/04/21   Christell Constant, MD  ?nicotine polacrilex (EQ NICOTINE POLACRILEX) 4 MG gum Take 1 each (4 mg total) by mouth as needed for smoking cessation. 11/04/21   Christell Constant, MD  ?nitroGLYCERIN (NITROSTAT) 0.4 MG SL tablet Place under the tongue as needed. 08/29/21   [provider]  ?potassium chloride (KLOR-CON) 10 MEQ tablet Take 1 tablet (10 mEq total) by mouth daily. 10/25/21   Christell Constant, MD  ?spironolactone (ALDACTONE) 25 MG tablet Take 0.5 tablets (12.5 mg total) by mouth daily. 12/15/21   Christell Constant, MD  ?traZODone (DESYREL) 100 MG tablet Take 100 mg by mouth at bedtime.    [provider]  ? ? ?Physical Exam  ?  ?Vital Signs:  Cody Carney does not have vital signs available for review today. ? ?Given telephonic nature of communication, physical exam is limited. ?AAOx3. NAD. Normal affect.  Speech and respirations are unlabored. ? ?Accessory Clinical Findings  ?  ?None ? ?Assessment & Plan  ?  ?1.  Preoperative Cardiovascular Risk Assessment: ? ?He has normal coronaries by heart cath 11/26/20 and a NICM. He is  unable to complete METS. He was just seen in the clinic by Dr. Izora Ribas 10/2021. Upon further questioning, he states he will not schedule surgery until after his disability appts. He has an appt with Dr. Izora Ribas 01/17/22. I will ask him to address preop clearance at that appt.  ? ?I will not charge for this visit. I will send a message to Dr. Izora Ribas. ? ? ?2. Guidance to hold antiplatelet medication ?Dr. Izora Ribas previously gave the OK to hold Plavix for 5 days before multiple teeth extraction in 10/2021. Since there has been no interval change in his cardiovascular history, it is reasonable to follow these guidelines again.  ? ? ?COVID-19 Education: ?The signs and symptoms of COVID-19 were discussed with the patient and how to seek care for testing (follow up with PCP or arrange E-visit).  The importance of social distancing was discussed today. ? ?Patient Risk:   ?After full review of this patient's history and clinical status, I feel that he is at least moderate risk for cardiac complications at this time, thus necessitating a telehealth visit sooner than  our first available in office visit. ? ?Time:   ?Today, I have spent 10 minutes with the patient with telehealth technology discussing medical history, symptoms, and management plan.   ? ? ?Marcelino Duster, PA ? ?12/26/2021, 2:27 PM ?

## 2022-01-03 ENCOUNTER — Ambulatory Visit: Payer: Medicaid Other

## 2022-01-04 ENCOUNTER — Telehealth: Payer: Self-pay

## 2022-01-04 NOTE — Telephone Encounter (Signed)
Called and was able to let them know that their appt for echo was rescheduled and they very much appreciated it  ?

## 2022-01-12 ENCOUNTER — Ambulatory Visit (INDEPENDENT_AMBULATORY_CARE_PROVIDER_SITE_OTHER): Payer: Medicaid Other

## 2022-01-12 ENCOUNTER — Other Ambulatory Visit (HOSPITAL_COMMUNITY): Payer: Medicaid Other

## 2022-01-12 DIAGNOSIS — I5020 Unspecified systolic (congestive) heart failure: Secondary | ICD-10-CM | POA: Diagnosis not present

## 2022-01-12 DIAGNOSIS — I700 Atherosclerosis of aorta: Secondary | ICD-10-CM | POA: Diagnosis not present

## 2022-01-12 LAB — ECHOCARDIOGRAM COMPLETE
Area-P 1/2: 3.11 cm2
Calc EF: 45.8 %
S' Lateral: 4.6 cm
Single Plane A2C EF: 47.7 %
Single Plane A4C EF: 43 %

## 2022-01-16 NOTE — Progress Notes (Signed)
?Cardiology Office Note:   ? ?Date:  01/17/2022  ? ?ID:  Cody Carney, DOB Nov 12, 1960, MRN [id number] ? ?PCP:  Barnetta Chapel, NP  ?[facility] Cardiologist:  Werner Lean, MD  ?[facility] Electrophysiologist:  None  ? ?CC: Preop eval ?Dr. Lorin Mercy- [facility] ? ?History of Present Illness:   ? ?Cody Carney is a 61 y.o. male with a hx of HTN, Aortic Atherosclerosis with Coronary Artery Calcifications, COPD and Tobacco Abuse with lymph node seen on CT who presented 08/2020 with decompensated HF.  ?2021: uptitrate GDMT, and patient had increase in his lasix to attempt to hold of admission.  Needed admission  2022: transition to lasix 80 mg BID, restarted of aldactone and transition from losartan 25 mg.  He has been lost to follow up due to some social issues TIA in November 2022. ?2023: returned to [place], smoke free LVEF improved over 45%. ? ?Patient notes that he is doing well.   ?This is the best he had felt in years.  Still toward the end of his disability case. ?There are no interval hospital/ED visit.   ? ?No chest pain or pressure .   ?No SOB/DOE and no PND/Orthopnea.  ?Since cutting back to 5 mg of Farxiga he has has had intermittent increases in hand swelling, weight gain, and abdominal swelling.  He has been able to improve this with extra lasix. ?He has changed his diet but will still have these issues; these have also occurred since his Delene Loll was stopped for hypotension. ? ? No weight gain or leg swelling.  No palpitations or syncope.  Does not some easy bruising on DAPT. ? ?He notes that he has some erectile dysfunction.  Has desire but unable to maintain full erection.  No PRN nitro. ? ? ?Past Medical History:  ?Diagnosis Date  ? Acute exacerbation of CHF (congestive heart failure) ([place]) 08/08/2020  ? Aortic atherosclerosis ([place]) 10/12/2020  ? CHF (congestive heart failure) ([place])   ? COPD (chronic obstructive pulmonary disease) ([place])   ? Coronary artery calcification 10/12/2020  ? Essential  hypertension 08/08/2020  ? HFrEF (heart failure with reduced ejection fraction) ([place]) 11/24/2020  ? Hypertension   ? Ischemic chest pain ([place]) 08/08/2020  ? Mixed hyperlipidemia 10/12/2020  ? Tobacco abuse 08/08/2020  ? ? ?Past Surgical History:  ?Procedure Laterality Date  ? LEG SURGERY    ? RIGHT/LEFT HEART CATH AND CORONARY ANGIOGRAPHY N/A 11/26/2020  ? Procedure: RIGHT/LEFT HEART CATH AND CORONARY ANGIOGRAPHY;  Surgeon: Troy Sine, MD;  Location: [place] CV [facility];  Service: Cardiovascular;  Laterality: N/A;  ? ? ?Current Medications: ?Current Meds  ?Medication Sig  ? acetaminophen (TYLENOL) 650 MG CR tablet Take 650 mg by mouth every 8 (eight) hours as needed for pain or fever.  ? aspirin 81 MG EC tablet Take 162 mg by mouth daily.  ? atorvastatin (LIPITOR) 80 MG tablet Take 80 mg by mouth at bedtime.  ? carvedilol (COREG) 12.5 MG tablet Take 12.5 mg by mouth 2 (two) times daily with a meal.  ? cetirizine (ZYRTEC) 10 MG tablet Take 10 mg by mouth daily.  ? clopidogrel (PLAVIX) 75 MG tablet Take 75 mg by mouth daily.  ? dapagliflozin propanediol (FARXIGA) 10 MG TABS tablet Take 1 tablet (10 mg total) by mouth daily before breakfast. (Patient taking differently: Take 5 mg by mouth daily before breakfast.)  ? furosemide (LASIX) 80 MG tablet Take 2 tablets (160 mg total) by mouth daily.  ? losartan (  COZAAR) 25 MG tablet Take 1 tablet (25 mg total) by mouth daily.  ? nicotine polacrilex (EQ NICOTINE POLACRILEX) 4 MG gum Take 1 each (4 mg total) by mouth as needed for smoking cessation.  ? potassium chloride (KLOR-CON) 10 MEQ tablet Take 1 tablet (10 mEq total) by mouth daily.  ? sildenafil (VIAGRA) 25 MG tablet Take 1 tablet (25 mg total) by mouth daily as needed for erectile dysfunction.  ? spironolactone (ALDACTONE) 25 MG tablet Take 0.5 tablets (12.5 mg total) by mouth daily.  ? traZODone (DESYREL) 100 MG tablet Take 100 mg by mouth at bedtime.  ? [DISCONTINUED] acetaminophen (TYLENOL) 325 MG tablet Take 650 mg  by mouth as needed.  ? [DISCONTINUED] nitroGLYCERIN (NITROSTAT) 0.4 MG SL tablet Place under the tongue as needed.  ? ?Current Facility-Administered Medications for the 01/17/22 encounter (Office Visit) with Werner Lean, MD  ?Medication  ? acetaminophen (TYLENOL) tablet 650 mg  ? ondansetron (ZOFRAN) 4 mg in sodium chloride 0.9 % 50 mL IVPB  ?  ? ?Allergies:   Codeine  ? ?Social History  ? ?Socioeconomic History  ? Marital status: Single  ?  Spouse name: Not on file  ? Number of children: Not on file  ? Years of education: Not on file  ? Highest education level: Not on file  ?Occupational History  ? Not on file  ?Tobacco Use  ? Smoking status: Every Day  ?  Packs/day: 0.25  ?  Years: 39.00  ?  Pack years: 9.75  ?  Types: Cigarettes  ? Smokeless tobacco: Never  ? Tobacco comments:  ?  encouraged smoking cessation, offered nicotine patch upon DC--relayed msg to primary team  ?Vaping Use  ? Vaping Use: Never used  ?Substance and Sexual Activity  ? Alcohol use: Yes  ?  Alcohol/week: 2.0 standard drinks  ?  Types: 2 Cans of beer per week  ?  Comment: per pt statement 2 cans beer on weekend with friends occassionally  ? Drug use: Not Currently  ?  Types: Marijuana  ?  Comment: Hasn't smoked in a "long time"  ? Sexual activity: Not Currently  ?Other Topics Concern  ? Not on file  ?Social History Narrative  ? Not on file  ? ?Social Determinants of Health  ? ?Financial Resource Strain: Not on file  ?Food Insecurity: Not on file  ?Transportation Needs: Not on file  ?Physical Activity: Not on file  ?Stress: Not on file  ?Social Connections: Not on file  ?  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 ?Family History: ?History of coronary artery disease notable for uncles have had MI. ?History of heart failure notable for no members. ?History of arrhythmia notable for uncle needing pacemaker. ?No history of  bicuspid aortic valve or aortic aneurysm or dissection. ? ?ROS:   ?Please see the history of present illness.    ?Bilateral knee and back pain ?All other systems reviewed and are negative. ? ?EKGs/Labs/Other Studies Reviewed:   ? ?The following studies were reviewed today: ? ?EKG:  ?01/17/22: SR 73  ?08/09/20:  Sinus tachycardia rate 113 bpm ? ?Transthoracic Echocardiogram: ?Date:01/12/22 ?Results: ?1. Improved LVEF - was 35-40% (08/2021), now 45-50%. MR from moderate  ?decreased now to mild. GLS -11.9. Left ventricular ejection fraction, by  ?estimation, is 45 to 50%. The left ventricle has mildly decreased  ?function. The left ventricle has  no regional  ? wall motion abnormalities. Left ventricular diastolic parameters are  ?consistent with Grade II diastolic dysfunction (pseudonormalization).  ? 2. Right ventricular systolic function is normal. The right ventricular  ?size is normal. There is normal pulmonary artery systolic pressure.  ? 3. The mitral valve is normal in structure. Mild mitral valve  ?regurgitation. No evidence of mitral stenosis.  ? 4. The aortic valve is normal in structure. Aortic valve regurgitation is  ?not visualized. No aortic stenosis is present.  ? 5. The inferior vena cava is normal in size with greater than 50%  ?respiratory variability, suggesting right atrial pressure of 3 mmHg.  ? ?NonCardiac CT: ?Date: 08/08/2020 ?Results: Stable mediastinal lymph node; aortic atherosclerosis, CAC ? ?Left/Right Heart Catheterizations: ?Date: 12/06/20 ?Normal coronary arteries with a dominant left circumflex system. ?  ?Low/normal right heart pressures/ ?  ?Findings are compatible with a nonischemic cardiomyopathy. ?  ?RECOMMENDATION: ?Guideline directed medical therapy for HFrEF.  Smoking cessation. ? ? ?Recent Labs: ?10/21/2021: Hemoglobin 14.0; NT-Pro BNP 131; Platelets 197 ?12/07/2021: BUN 16; Creatinine, Ser 0.95; Potassium 4.0; Sodium 139  ?Recent Lipid Panel ?   ?Component Value Date/Time  ? CHOL 143  10/21/2021 0902  ? TRIG 119 10/21/2021 0902  ? HDL 38 (L) 10/21/2021 0902  ? CHOLHDL 3.8 10/21/2021 0902  ? CHOLHDL 7.6 11/26/2020 0027  ? VLDL UNABLE TO CALCULATE IF TRIGLYCERIDE OVER 400 mg/dL 11/26/18

## 2022-01-17 ENCOUNTER — Other Ambulatory Visit (HOSPITAL_COMMUNITY): Payer: Medicaid Other

## 2022-01-17 ENCOUNTER — Encounter: Payer: Self-pay | Admitting: Internal Medicine

## 2022-01-17 ENCOUNTER — Ambulatory Visit (INDEPENDENT_AMBULATORY_CARE_PROVIDER_SITE_OTHER): Payer: Medicaid Other | Admitting: Internal Medicine

## 2022-01-17 VITALS — BP 113/74 | HR 73 | Wt 225.0 lb

## 2022-01-17 DIAGNOSIS — I700 Atherosclerosis of aorta: Secondary | ICD-10-CM | POA: Diagnosis not present

## 2022-01-17 DIAGNOSIS — I2510 Atherosclerotic heart disease of native coronary artery without angina pectoris: Secondary | ICD-10-CM

## 2022-01-17 DIAGNOSIS — E782 Mixed hyperlipidemia: Secondary | ICD-10-CM

## 2022-01-17 DIAGNOSIS — Z8673 Personal history of transient ischemic attack (TIA), and cerebral infarction without residual deficits: Secondary | ICD-10-CM | POA: Diagnosis not present

## 2022-01-17 DIAGNOSIS — I5020 Unspecified systolic (congestive) heart failure: Secondary | ICD-10-CM

## 2022-01-17 DIAGNOSIS — I2584 Coronary atherosclerosis due to calcified coronary lesion: Secondary | ICD-10-CM

## 2022-01-17 DIAGNOSIS — I10 Essential (primary) hypertension: Secondary | ICD-10-CM

## 2022-01-17 DIAGNOSIS — Z720 Tobacco use: Secondary | ICD-10-CM

## 2022-01-17 MED ORDER — SILDENAFIL CITRATE 25 MG PO TABS
25.0000 mg | ORAL_TABLET | Freq: Every day | ORAL | 1 refills | Status: DC | PRN
Start: 2022-01-17 — End: 2022-04-28

## 2022-01-17 NOTE — Patient Instructions (Signed)
Medication Instructions:  ?Your physician has recommended you make the following change in your medication:  ?STOP: Aspirin 81 mg ?STOP: Nitroglycerin ?CONTINUE: Farxiga 10 mg by mouth once daily, call into the office if you develop low BP <110 ?START: sildenafil 25 mg by mouth once daily as needed  ? ? ?*If you need a refill on your cardiac medications before your next appointment, please call your pharmacy* ? ? ?Lab Work: ?NONE ?If you have labs (blood work) drawn today and your tests are completely normal, you will receive your results only by: ?MyChart Message (if you have MyChart) OR ?A paper copy in the mail ?If you have any lab test that is abnormal or we need to change your treatment, we will call you to review the results. ? ? ?Testing/Procedures: ?NONE ? ? ?Follow-Up: ?At [facility], you and your health needs are our priority.  As part of our continuing mission to provide you with exceptional heart care, we have created designated Provider Care Teams.  These Care Teams include your primary Cardiologist (physician) and Advanced Practice Providers (APPs -  Physician Assistants and Nurse Practitioners) who all work together to provide you with the care you need, when you need it. ? ?We recommend signing up for the patient portal called "MyChart".  Sign up information is provided on this After Visit Summary.  MyChart is used to connect with patients for Virtual Visits (Telemedicine).  Patients are able to view lab/test results, encounter notes, upcoming appointments, etc.  Non-urgent messages can be sent to your provider as well.   ?To learn more about what you can do with MyChart, go to [email or web address].   ? ?Your next appointment:   ?6 month(s) ? ?The format for your next appointment:   ?In Person ? ?Provider:   ?Christell Constant, MD   ? ? ?Important Information About Sugar ? ? ? ? ?  ?

## 2022-01-17 NOTE — Progress Notes (Signed)
Stop Plavix when before surgery? ?

## 2022-01-31 ENCOUNTER — Ambulatory Visit: Payer: Medicaid Other

## 2022-01-31 NOTE — Patient Instructions (Addendum)
Continue to weigh yourself daily and keep sodium intake to less than 2000 mg every day.  ? ? ?

## 2022-01-31 NOTE — Progress Notes (Deleted)
Patient ID: Cody Carney                 DOB: 08/19/1961                      MRN: [id number] ? ? ? ? ?HPI: ?Cody Carney is a 61 y.o. male referred by Dr. Izora Ribas to pharmacy clinic for HF medication management. PMH is significant for HTN, CAD, TIA, tobacco abuse. Most recent LVEF 45-50% on 01/12/22 improved from 25-30% in 08/2020. He is enrolled in the DAP[employer] TIMI 68 trial, but done with the study drug. Was seen back in 2022 due to hypervolemia and GDMT titration. Eventually admitted due to volume overload. Then lost to follow up due to social issues. Has since been approved for medicaid.  ? ?He was seen by Dr. Izora Ribas on 10/21/21. Unclear exactly what he was taking. ?Was seen by PharmD 11/04/21. Sherryll Burger was stopped completely due to hypotension. He was started on losartan 25mg  daily and Farxiga 10mg  daily. He was switched from torsemide 80mg  daily to furosemide 80mg  daily per patient request. Patient's girlfriend called the office reporting that makes the patient feel like he doesn't have any energy. Also reported a weight gain (6lb in a week). Patient was seen in clinic. Reported that taking Farxiga 5 mg made him feel better. He was instructed to take furosemide 160mg  daily for 2 more days (4 days total) and then increase daily dose to 120mg  daily. I spoke with Christina on 2/24. She stated that furosemide 120mg  was not getting enough fluid off so she has increased to 160mg  daily. BMP on 3/1 was stable. Furosemide 160mg  daily was continued and spironolactone 12.5 mg daily added. Visit with Dr. 4/11, [name] increased to 10 mg daily. ? ?He presents to clinic today accompanied by his girlfriend . He reports that his weight will increase 5-7lb overnight and then come back down over the next 2-3 days. Reports mild swelling in his abdomen. *** dizziness, lightheadedness, and fatigue. Feels tired. Does not sleep well because of chronic pain. Living situation is still stressful. Moved in  with friend to help him out. Friend binge drinks. Has 6 dogs.  Does get food stamps and girlfriend does have an income. They report ok on food. Waiting for his disability to be approved. Has his name on the low income housing list. Still smoking. Tries to get out and do things, but then is tired and hurts for days afterwards. ? ?BMET today (need to order) - farxiga incr'd to 10 mg, no bmp since increasing spiro at 3/9 visit ?Any changes since increasing Farxiga 10 mg, adding spiro ?Lasix dose? ? ?Home BP readings? ?Sx - dizziness, lightheadedness, and fatigue. CP/palpitations, SOB. Edema, orthopnea? ADL's? ?Home weights  ? ?Options - if BP ok, push losartan to 50 or spiro to 25 ? ?Smoking Cessation - given gum at Feb visit but reported no further attempts to quit at March PharmD visit (stress as barrier) ?- Goal of making it to surgery without smoking  ? ?Current CHF meds:  ?Carvedilol 12.5 mg twice a day ?Losartan 25 mg daily ?Farxiga 10 mg daily ?Spironolactone 12.5 mg daily ?Furosemide 160mg  daily (reports taking BID at 01/17/22 visit with Dr. ) ?Potassium 10 MEQ daily ? ?Previously tried:  ?Entresto 24/26 mg BID - hypotension, systolic in 80s  ?Carvedilol 25 mg BID - hypotension ? ?BP goal: <130/80 ? ?Family History: History of coronary artery disease notable for uncles have had MI. ?History of  heart failure notable for no members. ?History of arrhythmia notable for uncle needing pacemaker. ?No history of bicuspid aortic valve or aortic aneurysm or dissection ? ?Social History: quit for 6 days then started again. < 1 ppd + ETOH ? ?Diet: chicken, pork or beef, beans, BP, going to start buying frozen vegetables ? ?Exercise: goes for a walk daily ? ?Home BP readings: 117-120/<80 ? ?Wt Readings from Last 3 Encounters:  ?01/17/22 225 lb (102.1 kg)  ?12/15/21 224 lb (101.6 kg)  ?11/29/21 225 lb (102.1 kg)  ? ?BP Readings from Last 3 Encounters:  ?01/17/22 113/74  ?12/15/21 118/70  ?11/29/21 (!) 142/91   ? ?Pulse Readings from Last 3 Encounters:  ?01/17/22 73  ?12/15/21 76  ?11/29/21 87  ? ? ?Renal function: ?CrCl cannot be calculated (Patient's most recent lab result is older than the maximum 21 days allowed.). ? ?Past Medical History:  ?Diagnosis Date  ? Acute exacerbation of CHF (congestive heart failure) (HCC) 08/08/2020  ? Aortic atherosclerosis (HCC) 10/12/2020  ? CHF (congestive heart failure) (HCC)   ? COPD (chronic obstructive pulmonary disease) (HCC)   ? Coronary artery calcification 10/12/2020  ? Essential hypertension 08/08/2020  ? HFrEF (heart failure with reduced ejection fraction) (HCC) 11/24/2020  ? Hypertension   ? Ischemic chest pain (HCC) 08/08/2020  ? Mixed hyperlipidemia 10/12/2020  ? Tobacco abuse 08/08/2020  ? ? ?Current Outpatient Medications on File Prior to Visit  ?Medication Sig Dispense Refill  ? acetaminophen (TYLENOL) 650 MG CR tablet Take 650 mg by mouth every 8 (eight) hours as needed for pain or fever.    ? aspirin 81 MG EC tablet Take 162 mg by mouth daily.    ? atorvastatin (LIPITOR) 80 MG tablet Take 80 mg by mouth at bedtime.    ? carvedilol (COREG) 12.5 MG tablet Take 12.5 mg by mouth 2 (two) times daily with a meal.    ? cetirizine (ZYRTEC) 10 MG tablet Take 10 mg by mouth daily.    ? clopidogrel (PLAVIX) 75 MG tablet Take 75 mg by mouth daily.    ? dapagliflozin propanediol (FARXIGA) 10 MG TABS tablet Take 1 tablet (10 mg total) by mouth daily before breakfast. (Patient taking differently: Take 5 mg by mouth daily before breakfast.) 90 tablet 3  ? furosemide (LASIX) 80 MG tablet Take 2 tablets (160 mg total) by mouth daily. 180 tablet 3  ? losartan (COZAAR) 25 MG tablet Take 1 tablet (25 mg total) by mouth daily. 90 tablet 3  ? nicotine polacrilex (EQ NICOTINE POLACRILEX) 4 MG gum Take 1 each (4 mg total) by mouth as needed for smoking cessation. 100 tablet 0  ? potassium chloride (KLOR-CON) 10 MEQ tablet Take 1 tablet (10 mEq total) by mouth daily. 90 tablet 3  ? sildenafil  (VIAGRA) 25 MG tablet Take 1 tablet (25 mg total) by mouth daily as needed for erectile dysfunction. 20 tablet 1  ? spironolactone (ALDACTONE) 25 MG tablet Take 0.5 tablets (12.5 mg total) by mouth daily. 45 tablet 3  ? traZODone (DESYREL) 100 MG tablet Take 100 mg by mouth at bedtime.    ? ?Current Facility-Administered Medications on File Prior to Visit  ?Medication Dose Route Frequency Provider Last Rate Last Admin  ? acetaminophen (TYLENOL) tablet 650 mg  650 mg Oral Q4H PRN Chandrasekhar, Mahesh A, MD      ? ondansetron (ZOFRAN) 4 mg in sodium chloride 0.9 % 50 mL IVPB  4 mg Intravenous Q6H PRN Chandrasekhar, Mahesh A, MD      ? ? ?  Allergies  ?Allergen Reactions  ? Codeine Nausea And Vomiting  ? ? ? ?Assessment/Plan: ? ?1. CHF - Patient weight is stable from last visit. Continue furosemide 160mg  daily. Will add spironolactone 12.5mg  daily. Continue carvedilol 12.5 mg twice a day, potassium 10 MEQ daily, furosemide 160mg  daily, losartan 25mg  daily, Farxiga 5mg  daily. Will need to address increasing Farxiga to 10mg  at next visit. Follow up in ~ 2 weeks. Will need BMP at hat time.  ? ?2. Tobacco Abuse: Patient wants to quit but has not attempted to quit again. Reports stress being a barrier. ? ?Thank you, ? ?Filbert Schilder, PharmD ?PGY1 Pharmacy Resident ? ?Olene Floss, Pharm.D, BCPS, CPP ?[facility]  ?[address] [place], [place] [address]  ?Phone: [phone]; Fax: [phone]  ? ? ?

## 2022-02-10 ENCOUNTER — Telehealth: Payer: Self-pay | Admitting: Pharmacist

## 2022-02-10 NOTE — Telephone Encounter (Signed)
Cody Carney called for Cody Carney since he missed his last apt with me. States he is doing well. BP staying around 110-115 systolic. No symptoms. Meds have been optimized. Do not need to reschedule unless pt feels like he needs to be seen. Taking whole dose of farxiga and doing well. ?

## 2022-03-20 ENCOUNTER — Other Ambulatory Visit: Payer: Self-pay

## 2022-03-30 ENCOUNTER — Encounter: Payer: Self-pay | Admitting: Surgery

## 2022-03-30 ENCOUNTER — Ambulatory Visit (INDEPENDENT_AMBULATORY_CARE_PROVIDER_SITE_OTHER): Payer: Medicaid Other | Admitting: Surgery

## 2022-03-30 VITALS — BP 179/93 | HR 69

## 2022-03-30 DIAGNOSIS — T8484XA Pain due to internal orthopedic prosthetic devices, implants and grafts, initial encounter: Secondary | ICD-10-CM

## 2022-04-04 ENCOUNTER — Encounter (HOSPITAL_COMMUNITY): Payer: Self-pay | Admitting: Orthopaedic Surgery

## 2022-04-04 ENCOUNTER — Other Ambulatory Visit: Payer: Self-pay

## 2022-04-04 NOTE — Anesthesia Preprocedure Evaluation (Addendum)
Anesthesia Evaluation  Patient identified by MRN, date of birth, ID band Patient awake    Reviewed: Allergy & Precautions, H&P , NPO status , Patient's Chart, lab work & pertinent test results  Airway Mallampati: II   Neck ROM: full    Dental   Pulmonary COPD, Current Smoker,    breath sounds clear to auscultation       Cardiovascular hypertension, +CHF   Rhythm:regular Rate:Normal     Neuro/Psych    GI/Hepatic   Endo/Other    Renal/GU      Musculoskeletal  (+) Arthritis ,   Abdominal   Peds  Hematology   Anesthesia Other Findings   Reproductive/Obstetrics                            Anesthesia Physical Anesthesia Plan  ASA: 3  Anesthesia Plan: General   Post-op Pain Management:    Induction: Intravenous  PONV Risk Score and Plan: 1 and Ondansetron, Dexamethasone, Midazolam and Treatment may vary due to age or medical condition  Airway Management Planned: Oral ETT  Additional Equipment:   Intra-op Plan:   Post-operative Plan: Extubation in OR  Informed Consent: I have reviewed the patients History and Physical, chart, labs and discussed the procedure including the risks, benefits and alternatives for the proposed anesthesia with the patient or authorized representative who has indicated his/her understanding and acceptance.     Dental advisory given  Plan Discussed with: CRNA, Anesthesiologist and Surgeon  Anesthesia Plan Comments: (PAT note by Antionette Poles, PA-C: Follows with cardiology for history nonischemic cardiopathy (EF 45 to 50% by echo 01/2022), prior TIA maintained on Plavix.  Normal coronaries by cath 11/26/2020.  Seen by Dr. Izora Ribas 01/28/22 for preop eval. Per note, "Preoperative Risk Assessment - The Revised Cardiac Risk Index =2CHF, CVA;6.6%: moderate risk of perioperative myocardial infarction, pulmonary edema, ventricular fibrillation, cardiac arrest, or  complete heart block. - despite this he is well compensated from his comorbidity - The patient may proceed to surgery at acceptable risk.  - Our service is available as needed in the peri-operative period.- we have decrease his anti-platelet therapy to plavix monotherapy, this can be stopped as needed prior to surgery."  Patient will need day of surgery labs and evaluation.  EKG 01/17/2022: Sinus rhythm.  Rate 73.  TTE 01/12/2022: 1. Improved LVEF - was 35-40% (08/2021), now 45-50%. MR from moderate  decreased now to mild. GLS -11.9. Left ventricular ejection fraction, by  estimation, is 45 to 50%. The left ventricle has mildly decreased  function. The left ventricle has no regional  wall motion abnormalities. Left ventricular diastolic parameters are  consistent with Grade II diastolic dysfunction (pseudonormalization).  2. Right ventricular systolic function is normal. The right ventricular  size is normal. There is normal pulmonary artery systolic pressure.  3. The mitral valve is normal in structure. Mild mitral valve  regurgitation. No evidence of mitral stenosis.  4. The aortic valve is normal in structure. Aortic valve regurgitation is  not visualized. No aortic stenosis is present.  5. The inferior vena cava is normal in size with greater than 50%  respiratory variability, suggesting right atrial pressure of 3 mmHg.   Cath 11/26/2020: Normal coronary arteries with a dominant left circumflex system.  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  RECOMMENDATION: Guideline directed medical therapy for HFrEF.  Smoking cessation. )       Anesthesia Quick Evaluation

## 2022-04-05 ENCOUNTER — Other Ambulatory Visit: Payer: Self-pay

## 2022-04-05 ENCOUNTER — Ambulatory Visit (HOSPITAL_COMMUNITY)
Admission: RE | Admit: 2022-04-05 | Discharge: 2022-04-05 | Disposition: A | Discharge status: Home / Self Care | Payer: Medicare Other | Attending: Orthopaedic Surgery | Admitting: Orthopaedic Surgery

## 2022-04-05 ENCOUNTER — Encounter (HOSPITAL_COMMUNITY): Payer: Self-pay | Admitting: Orthopaedic Surgery

## 2022-04-05 ENCOUNTER — Ambulatory Visit (HOSPITAL_BASED_OUTPATIENT_CLINIC_OR_DEPARTMENT_OTHER): Payer: Medicare Other | Admitting: Physician Assistant

## 2022-04-05 ENCOUNTER — Ambulatory Visit (HOSPITAL_COMMUNITY): Payer: Medicare Other | Admitting: Physician Assistant

## 2022-04-05 ENCOUNTER — Ambulatory Visit (HOSPITAL_COMMUNITY): Payer: Medicare Other

## 2022-04-05 ENCOUNTER — Encounter (HOSPITAL_COMMUNITY)
Admission: RE | Disposition: A | Discharge status: Home / Self Care | Payer: Self-pay | Source: Home / Self Care | Attending: Orthopaedic Surgery

## 2022-04-05 DIAGNOSIS — F17210 Nicotine dependence, cigarettes, uncomplicated: Secondary | ICD-10-CM | POA: Diagnosis not present

## 2022-04-05 DIAGNOSIS — I110 Hypertensive heart disease with heart failure: Secondary | ICD-10-CM | POA: Diagnosis not present

## 2022-04-05 DIAGNOSIS — Z8781 Personal history of (healed) traumatic fracture: Secondary | ICD-10-CM | POA: Diagnosis not present

## 2022-04-05 DIAGNOSIS — J449 Chronic obstructive pulmonary disease, unspecified: Secondary | ICD-10-CM

## 2022-04-05 DIAGNOSIS — I5020 Unspecified systolic (congestive) heart failure: Secondary | ICD-10-CM | POA: Diagnosis not present

## 2022-04-05 DIAGNOSIS — M1712 Unilateral primary osteoarthritis, left knee: Secondary | ICD-10-CM | POA: Diagnosis not present

## 2022-04-05 DIAGNOSIS — Y793 Surgical instruments, materials and orthopedic devices (including sutures) associated with adverse incidents: Secondary | ICD-10-CM | POA: Insufficient documentation

## 2022-04-05 DIAGNOSIS — T8484XA Pain due to internal orthopedic prosthetic devices, implants and grafts, initial encounter: Secondary | ICD-10-CM | POA: Diagnosis not present

## 2022-04-05 DIAGNOSIS — Z01818 Encounter for other preprocedural examination: Secondary | ICD-10-CM

## 2022-04-05 DIAGNOSIS — Z7902 Long term (current) use of antithrombotics/antiplatelets: Secondary | ICD-10-CM | POA: Insufficient documentation

## 2022-04-05 DIAGNOSIS — I509 Heart failure, unspecified: Secondary | ICD-10-CM | POA: Diagnosis not present

## 2022-04-05 HISTORY — PX: HARDWARE REMOVAL: SHX979

## 2022-04-05 LAB — BASIC METABOLIC PANEL
Anion gap: 9 (ref 5–15)
BUN: 16 mg/dL (ref 6–20)
CO2: 26 mmol/L (ref 22–32)
Calcium: 9.2 mg/dL (ref 8.9–10.3)
Chloride: 104 mmol/L (ref 98–111)
Creatinine, Ser: 0.92 mg/dL (ref 0.61–1.24)
GFR, Estimated: >60 mL/min (ref >60)
Glucose, Bld: 118 mg/dL — ABNORMAL HIGH (ref 70–99)
Potassium: 4.5 mmol/L (ref 3.5–5.1)
Sodium: 139 mmol/L (ref 135–145)

## 2022-04-05 LAB — CBC
HCT: 40.5 % (ref 39.0–52.0)
Hemoglobin: 14.4 g/dL (ref 13.0–17.0)
MCH: 33 pg (ref 26.0–34.0)
MCHC: 35.6 g/dL (ref 30.0–36.0)
MCV: 92.7 fL (ref 80.0–100.0)
Platelets: 149 10*3/uL — ABNORMAL LOW (ref 150–400)
RBC: 4.37 MIL/uL (ref 4.22–5.81)
RDW: 12.4 % (ref 11.5–15.5)
WBC: 7 10*3/uL (ref 4.0–10.5)
nRBC: 0 % (ref 0.0–0.2)

## 2022-04-05 SURGERY — REMOVAL, HARDWARE
Anesthesia: General | Site: Leg Lower | Laterality: Left

## 2022-04-05 MED ORDER — ONDANSETRON HCL 4 MG/2ML IJ SOLN: 4.0000 mg | Freq: Four times a day (QID) | INTRAMUSCULAR | Status: DC | PRN

## 2022-04-05 MED ORDER — MIDAZOLAM HCL 2 MG/2ML IJ SOLN
INTRAMUSCULAR | Status: AC
  Filled 2022-04-05: qty 2

## 2022-04-05 MED ORDER — OXYCODONE-ACETAMINOPHEN 7.5-325 MG PO TABS: 1.0000 | ORAL_TABLET | Freq: Four times a day (QID) | ORAL | 0 refills | Status: DC | PRN

## 2022-04-05 MED ORDER — DEXMEDETOMIDINE (PRECEDEX) IN NS 20 MCG/5ML (4 MCG/ML) IV SYRINGE
PREFILLED_SYRINGE | INTRAVENOUS | Status: DC | PRN
  Administered 2022-04-05 (×2): 8 ug via INTRAVENOUS

## 2022-04-05 MED ORDER — ORAL CARE MOUTH RINSE: 15.0000 mL | Freq: Once | OROMUCOSAL | Status: AC

## 2022-04-05 MED ORDER — BUPIVACAINE-EPINEPHRINE (PF) 0.25% -1:200000 IJ SOLN
INTRAMUSCULAR | Status: AC
  Filled 2022-04-05: qty 30

## 2022-04-05 MED ORDER — FENTANYL CITRATE (PF) 250 MCG/5ML IJ SOLN
INTRAMUSCULAR | Status: DC | PRN
Start: 2022-04-05 — End: 2022-04-05
  Administered 2022-04-05: 25 ug via INTRAVENOUS
  Administered 2022-04-05 (×2): 50 ug via INTRAVENOUS

## 2022-04-05 MED ORDER — CHLORHEXIDINE GLUCONATE 0.12 % MT SOLN: 15.0000 mL | Freq: Once | OROMUCOSAL | Status: AC

## 2022-04-05 MED ORDER — OXYCODONE HCL 5 MG/5ML PO SOLN: 5.0000 mg | Freq: Once | ORAL | Status: AC | PRN

## 2022-04-05 MED ORDER — CHLORHEXIDINE GLUCONATE 0.12 % MT SOLN
OROMUCOSAL | Status: AC
  Administered 2022-04-05: 15 mL via OROMUCOSAL
  Filled 2022-04-05: qty 15

## 2022-04-05 MED ORDER — OXYCODONE HCL 5 MG PO TABS
ORAL_TABLET | ORAL | Status: AC
  Filled 2022-04-05: qty 1

## 2022-04-05 MED ORDER — DEXAMETHASONE SODIUM PHOSPHATE 10 MG/ML IJ SOLN
INTRAMUSCULAR | Status: AC
  Filled 2022-04-05: qty 1

## 2022-04-05 MED ORDER — PROPOFOL 10 MG/ML IV BOLUS
INTRAVENOUS | Status: AC
  Filled 2022-04-05: qty 20

## 2022-04-05 MED ORDER — ONDANSETRON HCL 4 MG/2ML IJ SOLN
INTRAMUSCULAR | Status: DC | PRN
  Administered 2022-04-05: 4 mg via INTRAVENOUS

## 2022-04-05 MED ORDER — LACTATED RINGERS IV SOLN: INTRAVENOUS | Status: DC

## 2022-04-05 MED ORDER — MIDAZOLAM HCL 2 MG/2ML IJ SOLN
INTRAMUSCULAR | Status: DC | PRN
  Administered 2022-04-05: 2 mg via INTRAVENOUS

## 2022-04-05 MED ORDER — BUPIVACAINE-EPINEPHRINE 0.25% -1:200000 IJ SOLN
INTRAMUSCULAR | Status: DC | PRN
  Administered 2022-04-05: 10 mL

## 2022-04-05 MED ORDER — ONDANSETRON HCL 4 MG/2ML IJ SOLN
INTRAMUSCULAR | Status: AC
  Filled 2022-04-05: qty 2

## 2022-04-05 MED ORDER — FENTANYL CITRATE (PF) 100 MCG/2ML IJ SOLN
INTRAMUSCULAR | Status: AC
  Filled 2022-04-05: qty 2

## 2022-04-05 MED ORDER — PROPOFOL 10 MG/ML IV BOLUS
INTRAVENOUS | Status: DC | PRN
  Administered 2022-04-05: 150 mg via INTRAVENOUS

## 2022-04-05 MED ORDER — METHOCARBAMOL 500 MG PO TABS
500.0000 mg | ORAL_TABLET | Freq: Four times a day (QID) | ORAL | 0 refills | Status: DC | PRN
Start: 2022-04-05 — End: 2023-06-04

## 2022-04-05 MED ORDER — LIDOCAINE 2% (20 MG/ML) 5 ML SYRINGE
INTRAMUSCULAR | Status: DC | PRN
  Administered 2022-04-05: 60 mg via INTRAVENOUS

## 2022-04-05 MED ORDER — DEXAMETHASONE SODIUM PHOSPHATE 10 MG/ML IJ SOLN
INTRAMUSCULAR | Status: DC | PRN
  Administered 2022-04-05: 10 mg via INTRAVENOUS

## 2022-04-05 MED ORDER — FENTANYL CITRATE (PF) 250 MCG/5ML IJ SOLN
INTRAMUSCULAR | Status: AC
  Filled 2022-04-05: qty 5

## 2022-04-05 MED ORDER — FENTANYL CITRATE (PF) 100 MCG/2ML IJ SOLN
25.0000 ug | INTRAMUSCULAR | Status: DC | PRN
  Administered 2022-04-05 (×2): 50 ug via INTRAVENOUS

## 2022-04-05 MED ORDER — CEFAZOLIN SODIUM-DEXTROSE 2-4 GM/100ML-% IV SOLN
2.0000 g | INTRAVENOUS | Status: AC
  Administered 2022-04-05: 2 g via INTRAVENOUS
  Filled 2022-04-05: qty 100

## 2022-04-05 MED ORDER — OXYCODONE HCL 5 MG PO TABS
5.0000 mg | ORAL_TABLET | Freq: Once | ORAL | Status: AC | PRN
  Administered 2022-04-05: 5 mg via ORAL

## 2022-04-05 MED ORDER — LIDOCAINE 2% (20 MG/ML) 5 ML SYRINGE
INTRAMUSCULAR | Status: AC
  Filled 2022-04-05: qty 5

## 2022-04-05 SURGICAL SUPPLY — 47 items
BAG COUNTER SPONGE SURGICOUNT (BAG) ×2 IMPLANT
BANDAGE ESMARK 6X9 LF (GAUZE/BANDAGES/DRESSINGS) IMPLANT
BNDG ELASTIC 4X5.8 VLCR STR LF (GAUZE/BANDAGES/DRESSINGS) ×1 IMPLANT
BNDG ELASTIC 6X10 VLCR STRL LF (GAUZE/BANDAGES/DRESSINGS) ×1 IMPLANT
BNDG ESMARK 6X9 LF (GAUZE/BANDAGES/DRESSINGS) ×2
BNDG GAUZE ELAST 4 BULKY (GAUZE/BANDAGES/DRESSINGS) ×2 IMPLANT
COVER SURGICAL LIGHT HANDLE (MISCELLANEOUS) ×2 IMPLANT
CUFF TOURN SGL QUICK 34 NS (TOURNIQUET CUFF) ×1 IMPLANT
DRAPE C-ARM 42X72 X-RAY (DRAPES) ×1 IMPLANT
DRAPE HALF SHEET 40X57 (DRAPES) ×1 IMPLANT
DRAPE ORTHO SPLIT 77X108 STRL (DRAPES) ×2
DRAPE SURG ORHT 6 SPLT 77X108 (DRAPES) IMPLANT
DRSG EMULSION OIL 3X3 NADH (GAUZE/BANDAGES/DRESSINGS) ×2 IMPLANT
DRSG PAD ABDOMINAL 8X10 ST (GAUZE/BANDAGES/DRESSINGS) ×2 IMPLANT
DRSG XEROFORM 1X8 (GAUZE/BANDAGES/DRESSINGS) ×1 IMPLANT
ELECT REM PT RETURN 9FT ADLT (ELECTROSURGICAL) ×2
ELECTRODE REM PT RTRN 9FT ADLT (ELECTROSURGICAL) ×1 IMPLANT
GAUZE SPONGE 4X4 12PLY STRL (GAUZE/BANDAGES/DRESSINGS) ×2 IMPLANT
GAUZE SPONGE 4X4 12PLY STRL LF (GAUZE/BANDAGES/DRESSINGS) ×1 IMPLANT
GLOVE BIOGEL PI IND STRL 8 (GLOVE) ×2 IMPLANT
GLOVE BIOGEL PI INDICATOR 8 (GLOVE) ×2
GLOVE ORTHO TXT STRL SZ7.5 (GLOVE) ×4 IMPLANT
GOWN STRL REUS W/ TWL LRG LVL3 (GOWN DISPOSABLE) ×1 IMPLANT
GOWN STRL REUS W/ TWL XL LVL3 (GOWN DISPOSABLE) ×1 IMPLANT
GOWN STRL REUS W/TWL 2XL LVL3 (GOWN DISPOSABLE) ×2 IMPLANT
GOWN STRL REUS W/TWL LRG LVL3 (GOWN DISPOSABLE) ×6
GOWN STRL REUS W/TWL XL LVL3 (GOWN DISPOSABLE) ×2
KIT BASIN OR (CUSTOM PROCEDURE TRAY) ×2 IMPLANT
KIT TURNOVER KIT B (KITS) ×2 IMPLANT
MANIFOLD NEPTUNE II (INSTRUMENTS) ×2 IMPLANT
NS IRRIG 1000ML POUR BTL (IV SOLUTION) ×2 IMPLANT
PACK GENERAL/GYN (CUSTOM PROCEDURE TRAY) ×2 IMPLANT
PAD ARMBOARD 7.5X6 YLW CONV (MISCELLANEOUS) ×4 IMPLANT
PAD CAST 4YDX4 CTTN HI CHSV (CAST SUPPLIES) ×1 IMPLANT
PADDING CAST COTTON 4X4 STRL (CAST SUPPLIES) ×2
STAPLER VISISTAT 35W (STAPLE) ×2 IMPLANT
STOCKINETTE IMPERVIOUS 9X36 MD (GAUZE/BANDAGES/DRESSINGS) ×1 IMPLANT
SUCTION FRAZIER TIP 8 FR DISP (SUCTIONS) ×2
SUCTION TUBE FRAZIER 8FR DISP (SUCTIONS) IMPLANT
SUT ETHILON 4 0 FS 1 (SUTURE) IMPLANT
SUT VIC AB 0 CT1 27 (SUTURE)
SUT VIC AB 0 CT1 27XBRD ANBCTR (SUTURE) IMPLANT
SUT VIC AB 2-0 CT1 27 (SUTURE)
SUT VIC AB 2-0 CT1 TAPERPNT 27 (SUTURE) IMPLANT
TOWEL GREEN STERILE (TOWEL DISPOSABLE) ×2 IMPLANT
TOWEL GREEN STERILE FF (TOWEL DISPOSABLE) ×2 IMPLANT
WATER STERILE IRR 1000ML POUR (IV SOLUTION) ×2 IMPLANT

## 2022-04-05 NOTE — Transfer of Care (Signed)
Immediate Anesthesia Transfer of Care Note  Patient: Rudy Luhmann  Procedure(s) Performed: LEFT TIBIA NAIL AND INTERLOCK SCREWS REMOVAL (Left: Leg Lower)  Patient Location: PACU  Anesthesia Type:General  Level of Consciousness: drowsy and patient cooperative  Airway & Oxygen Therapy: Patient Spontanous Breathing  Post-op Assessment: Report given to RN and Post -op Vital signs reviewed and stable  Post vital signs: Reviewed and stable  Last Vitals:  Vitals Value Taken Time  BP 141/83 04/05/22 1420  Temp    Pulse 84 04/05/22 1423  Resp 17 04/05/22 1423  SpO2 98 % 04/05/22 1423  Vitals shown include unvalidated device data.  Last Pain:  Vitals:   04/05/22 1048  TempSrc:   PainSc: 8       Patients Stated Pain Goal: 3 (04/05/22 1048)  Complications: No notable events documented.

## 2022-04-05 NOTE — Op Note (Signed)
Preop diagnosis: Healed left tibia fracture with retained tibial nail and broken screw.  Postop diagnosis: Same  Procedure: Removal of left tibial nail and interlock screws.  Surgeon: Annell Greening, MD   Assistant: Zonia Kief, PA-C medically necessary and present for the entire procedure.  Tourniquet: 300 times less than 45 minutes.  Brief history 61 year old male had tibial nail placed elsewhere appears to be a Synthes with interlock.  Distal interlock screw broke and nail migrated distally which is immediately against the articular surface of the ankle with ankle pain.  Patient has severe left knee osteoarthritis and hardware removal is required for he can proceed with needed total knee arthroplasty.  Procedure: After induction of LMA tube placement proximal thigh tourniquet prepping from the tourniquet tip of toes Ancef prophylaxis DuraPrep, extremity sheets split sheets and drapes sterile gloves over the toes timeout procedure was completed.  Proximal to transverse screws were palpable and leg was elevated wrapped in Esmarch tourniquet inflated.  2 transverse screws went from medial to lateral in the pes bursa region small incision was made 15 blade cleaned off and the 2.5 Synthes screwdriver was used to remove the 2 interlock screws.  Distally the screw was more difficult to identify C arm sterilely draped brought in a needle was used and once the screw was exposed through a 2 cm incision screw which is at a 45 degree angle was removed.  Approximately the old scar was used for opening.  Nail was countersunk 1.5 to 2 cm inside the bone osteotome was used and bone was removed until the tip of the rod was visualized cleaned out inserter attached and then pounded out with a hammer.  Proximal portion of the nail was solid and triangular-shaped.  Copious irrigation tourniquet deflation 0 Vicryl reapproximation of the capsule.  Joint was not entered during the removal of the nail.  Toe and subtenons tissue  skin staple closure.  2-0 Vicryl distally where the distal screws removed and then skin with skin staples at all interlock removal spots.  Soft dressing and cam boot was applied.  Patient stay overnight be discharged in a.m.  Marcaine was infiltrated for analgesia.

## 2022-04-05 NOTE — Discharge Instructions (Signed)
Do not remove dressing or get wet  Boot must be on at all times.  Can 50% partial weight-bear in boot only with crutches.  No driving  Elevate foot above heart level is much as possible to help decrease pain and swelling

## 2022-04-05 NOTE — OR Nursing (Signed)
Left tibial nail and three screws removed intraoperatively.  Placed in bag and transported to recovery room with patient.  Waiver attached with explant.

## 2022-04-05 NOTE — H&P (Signed)
Cody Carney is an 61 y.o. male.   Chief Complaint: Painful retained hardware left tibia HPI:  61 year old white male with history of painful left tibia hardware comes in for prep evaluation.  States that symptoms unchanged from previous visit.  He is wanting to proceed with left tibial nail and interlock screws removal.  He received preop cardiac clearance.  Patient has already stopped Plavix.  History of physical performed.  Review of systems negative.  We will proceed with surgical procedure as scheduled. Past Medical History:  Diagnosis Date   Acute exacerbation of CHF (congestive heart failure) (HCC) 08/08/2020   Aortic atherosclerosis (HCC) 10/12/2020   CHF (congestive heart failure) (HCC)    COPD (chronic obstructive pulmonary disease) (HCC)    Coronary artery calcification 10/12/2020   Essential hypertension 08/08/2020   HFrEF (heart failure with reduced ejection fraction) (HCC) 11/24/2020   Hypertension    Ischemic chest pain (HCC) 08/08/2020   Mixed hyperlipidemia 10/12/2020   Tobacco abuse 08/08/2020    Past Surgical History:  Procedure Laterality Date   LEG SURGERY     RIGHT/LEFT HEART CATH AND CORONARY ANGIOGRAPHY N/A 11/26/2020   Procedure: RIGHT/LEFT HEART CATH AND CORONARY ANGIOGRAPHY;  Surgeon: Lennette Bihari, MD;  Location: [facility];  Service: Cardiovascular;  Laterality: N/A;    History reviewed. No pertinent family history. Social History:  reports that he has been smoking cigarettes. He has a 9.75 pack-year smoking history. He has never used smokeless tobacco. He reports that he does not currently use alcohol after a past usage of about 2.0 standard drinks of alcohol per week. He reports that he does not currently use drugs after having used the following drugs: Marijuana.  Allergies:  Allergies  Allergen Reactions   Codeine Nausea And Vomiting    Facility-Administered Medications Prior to Admission  Medication Dose Route Frequency Provider Last Rate Last  Admin   acetaminophen (TYLENOL) tablet 650 mg  650 mg Oral Q4H PRN Chandrasekhar, Mahesh A, MD       ondansetron (ZOFRAN) 4 mg in sodium chloride 0.9 % 50 mL IVPB  4 mg Intravenous Q6H PRN Chandrasekhar, Mahesh A, MD       Medications Prior to Admission  Medication Sig Dispense Refill   atorvastatin (LIPITOR) 80 MG tablet Take 80 mg by mouth at bedtime.     carvedilol (COREG) 12.5 MG tablet Take 12.5 mg by mouth 2 (two) times daily with a meal.     cetirizine (ZYRTEC) 10 MG tablet Take 10 mg by mouth daily.     clopidogrel (PLAVIX) 75 MG tablet Take 75 mg by mouth daily.     dapagliflozin propanediol (FARXIGA) 10 MG TABS tablet Take 1 tablet (10 mg total) by mouth daily before breakfast. 90 tablet 3   furosemide (LASIX) 80 MG tablet Take 2 tablets (160 mg total) by mouth daily. 180 tablet 3   losartan (COZAAR) 25 MG tablet Take 1 tablet (25 mg total) by mouth daily. 90 tablet 3   potassium chloride (KLOR-CON) 10 MEQ tablet Take 1 tablet (10 mEq total) by mouth daily. 90 tablet 3   sildenafil (VIAGRA) 25 MG tablet Take 1 tablet (25 mg total) by mouth daily as needed for erectile dysfunction. 20 tablet 1   spironolactone (ALDACTONE) 25 MG tablet Take 25 mg by mouth daily. 45 tablet 3   traZODone (DESYREL) 100 MG tablet Take 50 mg by mouth at bedtime as needed for sleep.     nicotine polacrilex (EQ NICOTINE POLACRILEX) 4  MG gum Take 1 each (4 mg total) by mouth as needed for smoking cessation. 100 tablet 0    Results for orders placed or performed during the hospital encounter of 04/05/22 (from the past 48 hour(s))  CBC per protocol     Status: Abnormal   Collection Time: 04/05/22 10:47 AM  Result Value Ref Range   WBC 7.0 4.0 - 10.5 K/uL   RBC 4.37 4.22 - 5.81 MIL/uL   Hemoglobin 14.4 13.0 - 17.0 g/dL   HCT 59.0 59.0 - 29.5 %   MCV 92.7 80.0 - 100.0 fL   MCH 33.0 26.0 - 34.0 pg   MCHC 35.6 30.0 - 36.0 g/dL   RDW 28.4 13.2 - 44.0 %   Platelets 149 (L) 150 - 400 K/uL   nRBC 0.0 0.0 - 0.2  %    Comment: Performed at [facility], [address] [place], [place] [address]  Basic metabolic panel per protocol     Status: Abnormal   Collection Time: 04/05/22 10:47 AM  Result Value Ref Range   Sodium 139 135 - 145 mmol/L   Potassium 4.5 3.5 - 5.1 mmol/L    Comment: HEMOLYSIS AT THIS LEVEL MAY AFFECT RESULT   Chloride 104 98 - 111 mmol/L   CO2 26 22 - 32 mmol/L   Glucose, Bld 118 (H) 70 - 99 mg/dL    Comment: Glucose reference range applies only to samples taken after fasting for at least 8 hours.   BUN 16 6 - 20 mg/dL   Creatinine, Ser 5.36 0.61 - 1.24 mg/dL   Calcium 9.2 8.9 - 64.4 mg/dL   GFR, Estimated >03 >47 mL/min    Comment: (NOTE) Calculated using the CKD-EPI Creatinine Equation (2021)    Anion gap 9 5 - 15    Comment: Performed at [facility], [address] [place], [place] [address]   No results found.  Review of Systems No current cardiopulmonary GI/GU issue Blood pressure (!) 151/73, pulse 72, temperature (!) 97.5 F (36.4 C), temperature source Oral, resp. rate 18, height 5\' 8"  (1.727 m), weight 99.8 kg, SpO2 96 %. Physical Exam HENT:     Head: Normocephalic and atraumatic.     Nose: Nose normal.  Cardiovascular:     Rate and Rhythm: Normal rate and regular rhythm.  Pulmonary:     Effort: Pulmonary effort is normal. No respiratory distress.     Breath sounds: Normal breath sounds.  Neurological:     Mental Status: He is alert and oriented to person, place, and time.      Assessment/Plan Painful retained hardware left tibia.  Will Proceed with left tibia nail and interlock screws removal.  Surgical procedure discussed.  All questions answered.  , PA-C 04/05/2022, 12:12 PM

## 2022-04-05 NOTE — Progress Notes (Signed)
Orthopedic Tech Progress Note Patient Details:  Cody Carney Aug 07, 1961 [id number]  Ortho Devices Type of Ortho Device: CAM walker Ortho Device/Splint Interventions: Ordered  Dropped off cam walker boot to the OR desk    Layah Skousen OTR/L 04/05/2022, 2:26 PM

## 2022-04-05 NOTE — Interval H&P Note (Signed)
History and Physical Interval Note:  04/05/2022 12:33 PM  Cody Carney  has presented today for surgery, with the diagnosis of painful left tibia nail, broken screws.  The various methods of treatment have been discussed with the patient and family. After consideration of risks, benefits and other options for treatment, the patient has consented to  Procedure(s): LEFT TIBIA NAIL AND INTERLOCK SCREWS REMOVAL (Left) as a surgical intervention.  The patient's history has been reviewed, patient examined, no change in status, stable for surgery.  I have reviewed the patient's chart and labs.  Questions were answered to the patient's satisfaction.     [name]

## 2022-04-05 NOTE — Progress Notes (Signed)
Orthopedic Tech Progress Note Patient Details:  Cody Carney 1961/09/02 [id number]  Ortho Devices Type of Ortho Device: Crutches Ortho Device/Splint Interventions: Ordered, Application, Adjustment   Post Interventions Instructions Provided: Care of device Crutches dropped off with patient in PACU; stated he already knows how to use crutches.  Darleen Crocker 04/05/2022, 3:44 PM

## 2022-04-06 ENCOUNTER — Encounter (HOSPITAL_COMMUNITY): Payer: Self-pay | Admitting: Orthopaedic Surgery

## 2022-04-06 NOTE — Anesthesia Postprocedure Evaluation (Signed)
Anesthesia Post Note  Patient: Cody Carney  Procedure(s) Performed: LEFT TIBIA NAIL AND INTERLOCK SCREWS REMOVAL (Left: Leg Lower)     Patient location during evaluation: PACU Anesthesia Type: General Level of consciousness: awake and alert Pain management: pain level controlled Vital Signs Assessment: post-procedure vital signs reviewed and stable Respiratory status: spontaneous breathing, nonlabored ventilation, respiratory function stable and patient connected to nasal cannula oxygen Cardiovascular status: blood pressure returned to baseline and stable Postop Assessment: no apparent nausea or vomiting Anesthetic complications: no   No notable events documented.  Last Vitals:  Vitals:   04/05/22 1435 04/05/22 1450  BP: (!) 139/98 128/90  Pulse: 79 73  Resp: 17 18  Temp:  36.8 C  SpO2: 98% 96%    Last Pain:  Vitals:   04/05/22 1450  TempSrc:   PainSc: 0-No pain                 Dearius Hoffmann S

## 2022-04-12 ENCOUNTER — Other Ambulatory Visit: Payer: Self-pay | Admitting: Orthopaedic Surgery

## 2022-04-12 ENCOUNTER — Ambulatory Visit (INDEPENDENT_AMBULATORY_CARE_PROVIDER_SITE_OTHER): Payer: Medicaid Other | Admitting: Surgery

## 2022-04-12 ENCOUNTER — Telehealth: Payer: Self-pay | Admitting: Orthopaedic Surgery

## 2022-04-12 ENCOUNTER — Encounter: Payer: Self-pay | Admitting: Surgery

## 2022-04-12 ENCOUNTER — Ambulatory Visit: Payer: Self-pay

## 2022-04-12 VITALS — Ht 68.0 in

## 2022-04-12 DIAGNOSIS — M79605 Pain in left leg: Secondary | ICD-10-CM

## 2022-04-12 MED ORDER — OXYCODONE-ACETAMINOPHEN 5-325 MG PO TABS: 1.0000 | ORAL_TABLET | Freq: Four times a day (QID) | ORAL | 0 refills | Status: DC | PRN

## 2022-04-12 NOTE — Progress Notes (Signed)
Post-Op Visit Note   Patient: Cody Carney           Date of Birth: 02-03-1961           MRN: [id number] Visit Date: 04/12/2022 PCP: Eber Jones, NP   Assessment & Plan:  Chief Complaint:  Chief Complaint  Patient presents with   Left Leg - Routine Post Op  61 year old white male returns.  Status post tibial nail and screw removal. Visit Diagnoses:  1. Pain in left leg   Hardware removal left tibia  Plan: I stressed patient importance of being compliant with weightbearing instructions.  Per Dr. Ophelia Charter before leaving the hospital instructions were partial weightbearing 50% left leg.  I instructed patient to continue doing this at least until he sees Dr. Ophelia Charter next week for wound check and possible suture and staple removal.  He also understands that he may have limited weightbearing at least until 6 weeks postop.  He understands that poor compliance could lead to another fracture of his tibia which would require another nail versus plate and screws potentially. Follow-Up Instructions: Return in about 1 week (around 04/19/2022) for With Dr. Ophelia Charter for wound check and possible suture and staple removal.   Orders:  Orders Placed This Encounter  Procedures   XR Tibia/Fibula Left   No orders of the defined types were placed in this encounter.   Imaging: No results found.  PMFS History: Patient Active Problem List   Diagnosis Date Noted   Painful orthopaedic hardware [facility])    Patellar dislocation, left, subsequent encounter 12/05/2021   Bennett's fracture of base of metacarpal bone of right thumb 11/03/2021   Clavicle fracture 06/28/2021   Unilateral primary osteoarthritis, left knee 05/30/2021   HFrEF (heart failure with reduced ejection fraction) (HCC) 11/24/2020   Aortic atherosclerosis (HCC) 10/12/2020   Coronary artery calcification 10/12/2020   Mixed hyperlipidemia 10/12/2020   COPD (chronic obstructive pulmonary disease) (HCC) 08/08/2020   Essential hypertension  08/08/2020   Tobacco abuse 08/08/2020   Past Medical History:  Diagnosis Date   Acute exacerbation of CHF (congestive heart failure) (HCC) 08/08/2020   Aortic atherosclerosis (HCC) 10/12/2020   CHF (congestive heart failure) (HCC)    COPD (chronic obstructive pulmonary disease) (HCC)    Coronary artery calcification 10/12/2020   Essential hypertension 08/08/2020   HFrEF (heart failure with reduced ejection fraction) (HCC) 11/24/2020   Hypertension    Ischemic chest pain (HCC) 08/08/2020   Mixed hyperlipidemia 10/12/2020   Tobacco abuse 08/08/2020    History reviewed. No pertinent family history.  Past Surgical History:  Procedure Laterality Date   HARDWARE REMOVAL Left 04/05/2022   Procedure: LEFT TIBIA NAIL AND INTERLOCK SCREWS REMOVAL;  Surgeon: [name], MD;  Location[facility];  Service: Orthopedics;  Laterality: Left;   LEG SURGERY     RIGHT/LEFT HEART CATH AND CORONARY ANGIOGRAPHY N/A 11/26/2020   Procedure: RIGHT/LEFT HEART CATH AND CORONARY ANGIOGRAPHY;  Surgeon: Lennette Bihari, MD;  Location: [facility] INVASIVE [facility];  Service: Cardiovascular;  Laterality: N/A;   Social History   Occupational History   Not on file  Tobacco Use   Smoking status: Every Day    Packs/day: 0.25    Years: 39.00    Total pack years: 9.75    Types: Cigarettes   Smokeless tobacco: Never   Tobacco comments:    encouraged smoking cessation, offered nicotine patch upon DC--relayed msg to primary team  Vaping Use   Vaping Use: Never used  Substance and  Sexual Activity   Alcohol use: Not Currently    Alcohol/week: 2.0 standard drinks of alcohol    Types: 2 Cans of beer per week    Comment: per pt statement 2 cans beer on weekend with friends occassionally   Drug use: Not Currently    Types: Marijuana    Comment: Hasn't smoked in a "long time"   Sexual activity: Not Currently   Exam Sutures and staples intact.  No drainage or signs of infection.  Calf nontender.

## 2022-04-12 NOTE — Telephone Encounter (Signed)
Patient states he went to pick up Oxycodone and it was not ready he would like it sent to the [facility] in [place] at [address], [place], [place] [address], being that it is closer, states he will not have a way to pick it up later so could it be done ASAP please

## 2022-04-12 NOTE — Telephone Encounter (Signed)
Please advise 

## 2022-04-13 NOTE — Telephone Encounter (Signed)
Called and advised pt.

## 2022-04-19 ENCOUNTER — Ambulatory Visit (INDEPENDENT_AMBULATORY_CARE_PROVIDER_SITE_OTHER): Payer: Medicaid Other | Admitting: Orthopaedic Surgery

## 2022-04-19 ENCOUNTER — Encounter: Payer: Self-pay | Admitting: Orthopaedic Surgery

## 2022-04-19 VITALS — BP 117/77 | HR 84 | Ht 68.0 in | Wt 225.0 lb

## 2022-04-19 DIAGNOSIS — T8484XA Pain due to internal orthopedic prosthetic devices, implants and grafts, initial encounter: Secondary | ICD-10-CM

## 2022-04-19 NOTE — Progress Notes (Signed)
Post-Op Visit Note   Patient: Cody Carney           Date of Birth: May 14, 1961           MRN: [id number] Visit Date: 04/19/2022 PCP: Eber Jones, NP   Assessment & Plan: Postop intramedullary rod removal and interlock screw removal left tibia so that he can later proceed with total knee arthroplasty.  He can now use his boot weightbearing in his boot in the house.  He can fix and weight-bear when he is out of the house well with his crutches and progress with weightbearing as tolerated with his boot.  Remove it to wash take shower.  Chief Complaint:  Chief Complaint  Patient presents with   Left Leg - Routine Post Op   Visit Diagnoses:  1. Painful orthopaedic hardware [facility])     Plan: Return in 6 weeks.  AP lateral left knee on return visit for preoperative planning for left total knee arthroplasty.  Follow-Up Instructions: Return in about 6 weeks (around 05/31/2022).   Orders:  No orders of the defined types were placed in this encounter.  No orders of the defined types were placed in this encounter.   Imaging: No results found.  PMFS History: Patient Active Problem List   Diagnosis Date Noted   Painful orthopaedic hardware [facility])    Patellar dislocation, left, subsequent encounter 12/05/2021   Bennett's fracture of base of metacarpal bone of right thumb 11/03/2021   Clavicle fracture 06/28/2021   Unilateral primary osteoarthritis, left knee 05/30/2021   HFrEF (heart failure with reduced ejection fraction) (HCC) 11/24/2020   Aortic atherosclerosis (HCC) 10/12/2020   Coronary artery calcification 10/12/2020   Mixed hyperlipidemia 10/12/2020   COPD (chronic obstructive pulmonary disease) (HCC) 08/08/2020   Essential hypertension 08/08/2020   Tobacco abuse 08/08/2020   Past Medical History:  Diagnosis Date   Acute exacerbation of CHF (congestive heart failure) (HCC) 08/08/2020   Aortic atherosclerosis (HCC) 10/12/2020   CHF (congestive heart failure) (HCC)    COPD  (chronic obstructive pulmonary disease) (HCC)    Coronary artery calcification 10/12/2020   Essential hypertension 08/08/2020   HFrEF (heart failure with reduced ejection fraction) (HCC) 11/24/2020   Hypertension    Ischemic chest pain (HCC) 08/08/2020   Mixed hyperlipidemia 10/12/2020   Tobacco abuse 08/08/2020    History reviewed. No pertinent family history.  Past Surgical History:  Procedure Laterality Date   HARDWARE REMOVAL Left 04/05/2022   Procedure: LEFT TIBIA NAIL AND INTERLOCK SCREWS REMOVAL;  Surgeon: [name], MD;  Location[facility];  Service: Orthopedics;  Laterality: Left;   LEG SURGERY     RIGHT/LEFT HEART CATH AND CORONARY ANGIOGRAPHY N/A 11/26/2020   Procedure: RIGHT/LEFT HEART CATH AND CORONARY ANGIOGRAPHY;  Surgeon: Lennette Bihari, MD;  [facility] INV[facility];  Service: Cardiovascular;  Laterality: N/A;   Social History   Occupational History   Not on file  Tobacco Use   Smoking status: Every Day    Packs/day: 0.25    Years: 39.00    Total pack years: 9.75    Types: Cigarettes   Smokeless tobacco: Never   Tobacco comments:    encouraged smoking cessation, offered nicotine patch upon DC--relayed msg to primary team  Vaping Use   Vaping Use: Never used  Substance and Sexual Activity   Alcohol use: Not Currently    Alcohol/week: 2.0 standard drinks of alcohol    Types: 2 Cans of beer per week    Comment:  per pt statement 2 cans beer on weekend with friends occassionally   Drug use: Not Currently    Types: Marijuana    Comment: Hasn't smoked in a "long time"   Sexual activity: Not Currently

## 2022-04-28 ENCOUNTER — Telehealth: Payer: Self-pay | Admitting: Internal Medicine

## 2022-04-28 MED ORDER — SILDENAFIL CITRATE 50 MG PO TABS: 50.0000 mg | ORAL_TABLET | Freq: Every day | ORAL | 1 refills | Status: AC | PRN

## 2022-04-28 NOTE — Telephone Encounter (Signed)
Sent in script for sildenafil 50 mg PO QD PRN ED.  #20 1 refill.

## 2022-04-28 NOTE — Telephone Encounter (Signed)
Called pt to inquire sildenafil dose and any side effects.  Pt reports takes x2 25 mg tablets and hasn't noticed any negative effects.  Denies symptoms of low BP.  Will route to MD for okay to prescribe sildenafil 50 mg PO QD PRN .

## 2022-04-28 NOTE — Telephone Encounter (Signed)
Pt c/o medication issue:  1. Name of Medication: sildenafil (VIAGRA) 25 MG tablet  2. How are you currently taking this medication (dosage and times per day)? Take 1 tablet (25 mg total) by mouth daily as needed for erectile dysfunction.  3. Are you having a reaction (difficulty breathing--STAT)? No   4. What is your medication issue? Received call from [facility] from [facility] and said that patient has been taking 2 tablets when needed instead of 1 and they want to check with Dr. Izora Ribas to see if its ok to increase to 2 when needed due to concern of blood pressure raising.

## 2022-05-31 ENCOUNTER — Encounter: Payer: Medicaid Other | Admitting: Orthopaedic Surgery

## 2022-06-14 ENCOUNTER — Encounter: Payer: Medicaid Other | Admitting: Orthopaedic Surgery

## 2022-06-21 IMAGING — CT CT ANGIO CHEST
2 of 7 series · 18 of 46 positions shown · IV contrast (omnipaque)
Comparison: October 12, 2019

CLINICAL DATA: Elevated D-dimer.

EXAM:
CT ANGIOGRAPHY CHEST WITH CONTRAST
TECHNIQUE: Multidetector CT imaging of the chest was performed using the
standard protocol during bolus administration of intravenous
contrast. Multiplanar CT image reconstructions and MIPs were
obtained to evaluate the vascular anatomy.
CONTRAST:  100mL OMNIPAQUE IOHEXOL 350 MG/ML SOLN

[Series 7: thins · axial · 0.85mm/px · z∈[+1037,+1360]mm · 15 of 519 slices shown]
[im 29/519  lung]
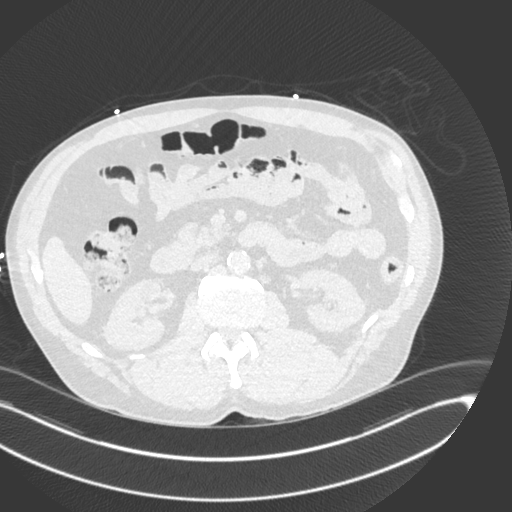
[im 58/519  soft-tissue]
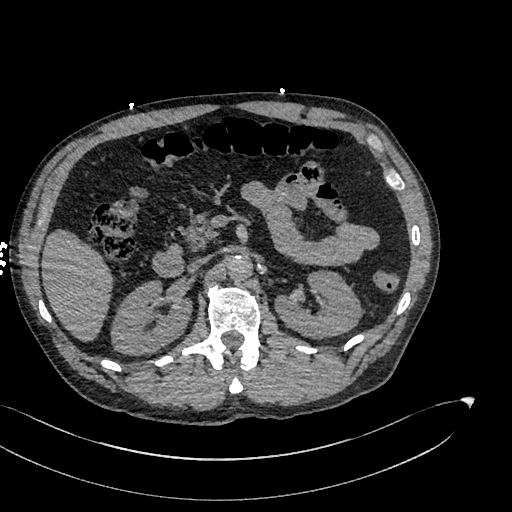
[im 87/519  lung]
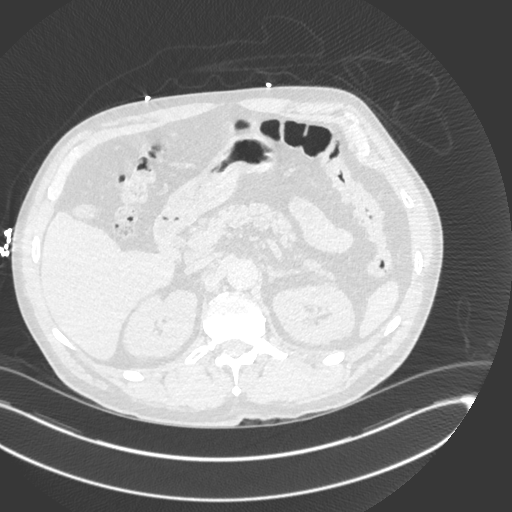
[im 116/519  soft-tissue]
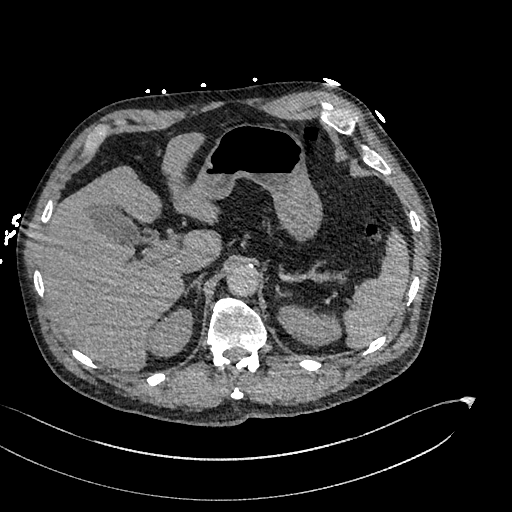
[im 173/519  lung]
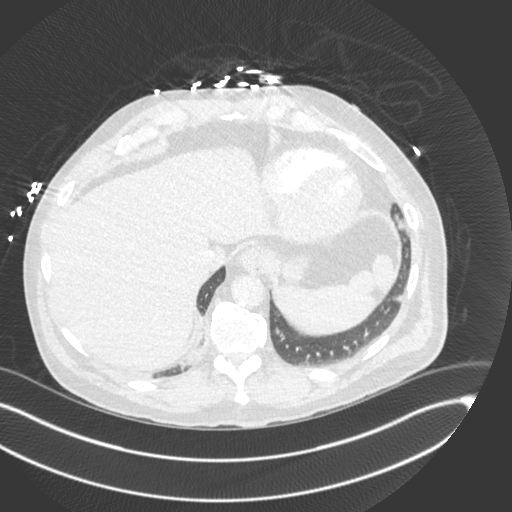
[im 202/519  soft-tissue]
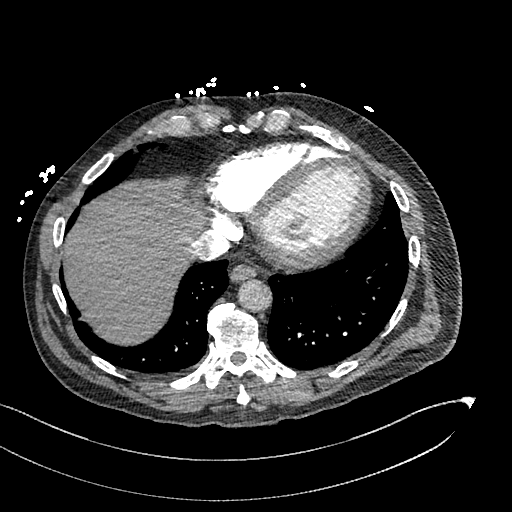
[im 231/519  lung]
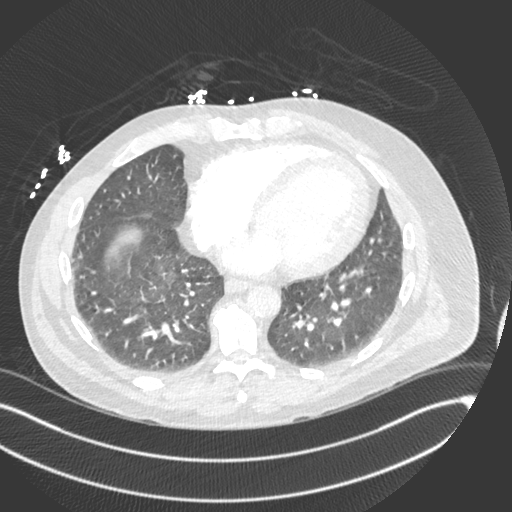
[im 260/519  soft-tissue]
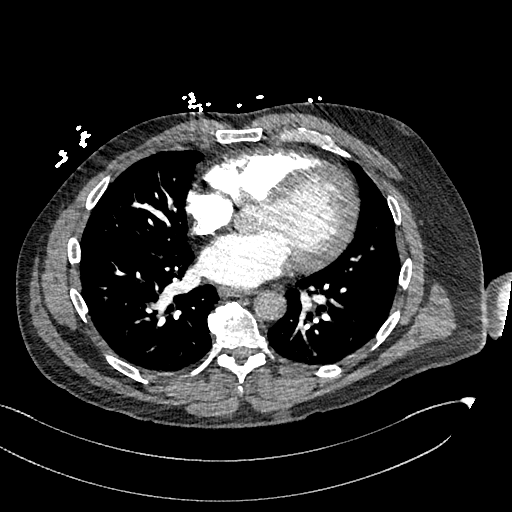
[im 288/519  lung]
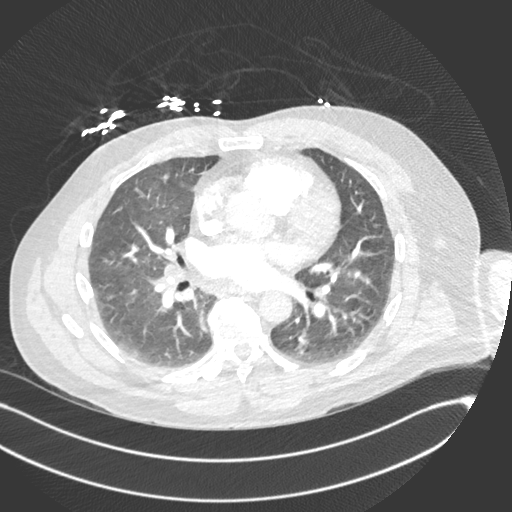
[im 317/519  soft-tissue]
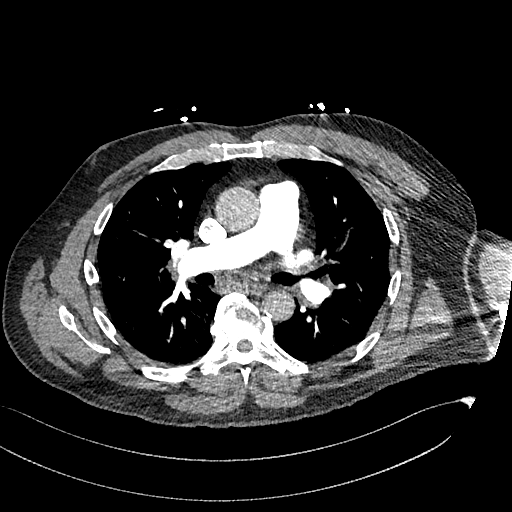
[im 346/519  lung]
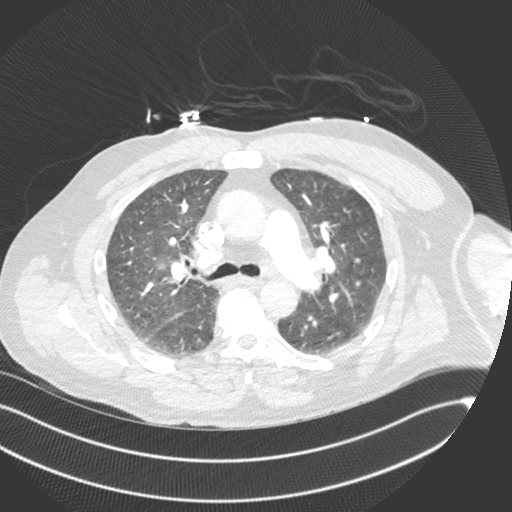
[im 403/519  soft-tissue]
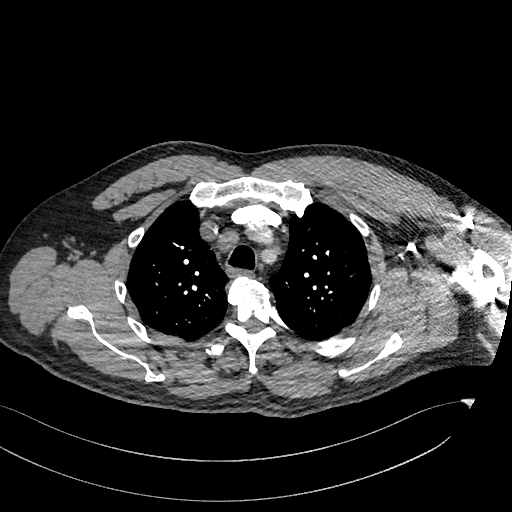
[im 432/519  lung]
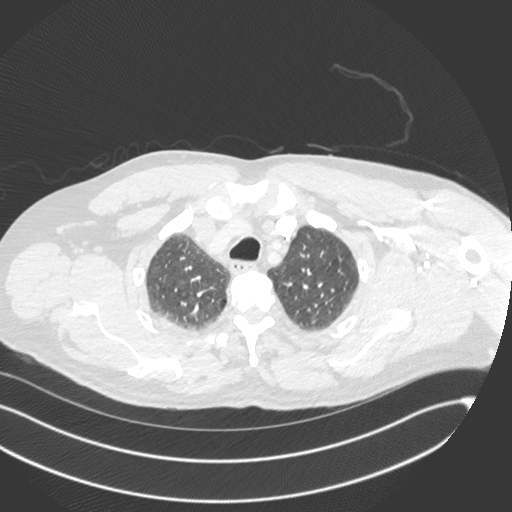
[im 461/519  soft-tissue]
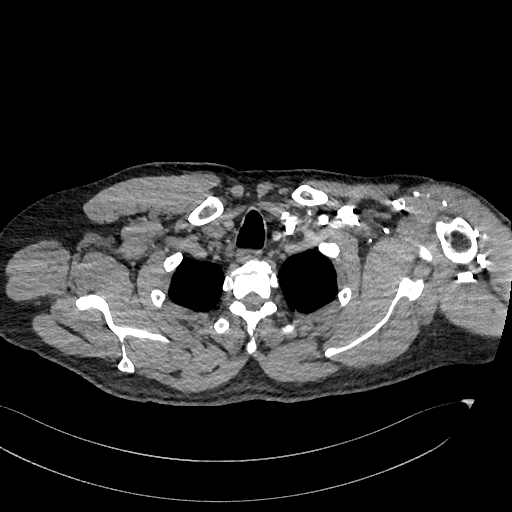
[im 490/519  lung]
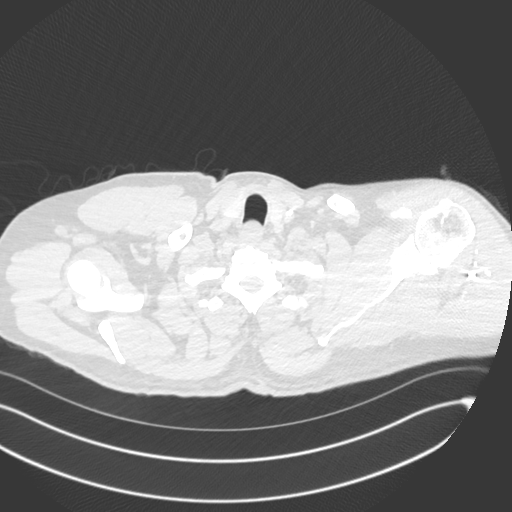

[Series 8: cor · coronal · 0.75mm/px · 3 of 155 slices shown]
[im 39/155  soft-tissue]
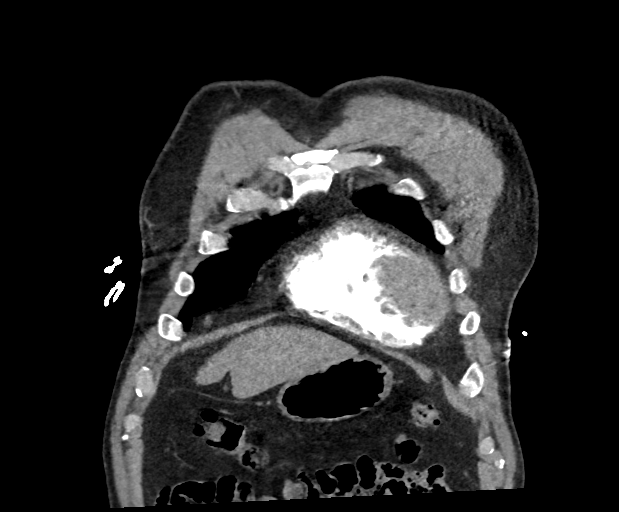
[im 78/155  soft-tissue]
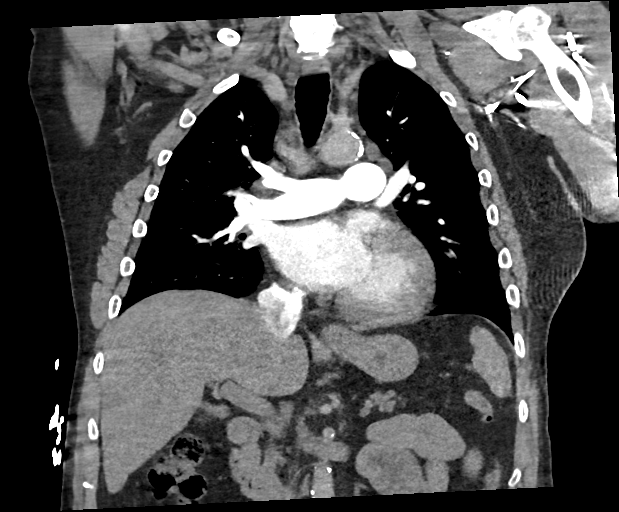
[im 116/155  soft-tissue]
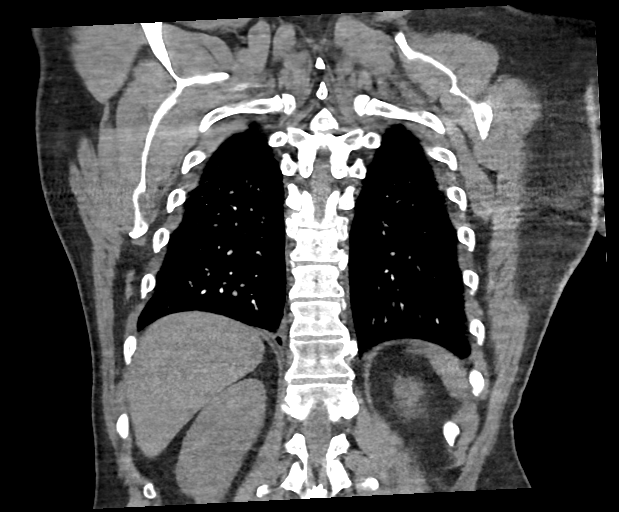

[18 of 46 positions shown; findings below may reference images not displayed]

FINDINGS: Cardiovascular: There is moderate severity calcification of the
aortic arch and descending thoracic aorta. Satisfactory
opacification of the pulmonary arteries to the segmental level. No
evidence of pulmonary embolism. Normal heart size. No pericardial
effusion.

Mediastinum/Nodes: Stable 1.8 cm x 1.7 cm and 2.5 cm x 1.1 cm
pretracheal lymph nodes are seen. Additional stable 1.5 cm x 1.0 cm
and 2.4 cm x 1.4 cm AP window lymph nodes are also noted. Mild right
hilar lymphadenopathy is present. Thyroid gland, trachea, and
esophagus demonstrate no significant findings.

Lungs/Pleura: Lungs are clear. No pleural effusion or pneumothorax.

Upper Abdomen: No acute abnormality.

Musculoskeletal: Multilevel degenerative changes seen throughout the
thoracic spine.

Review of the MIP images confirms the above findings.
IMPRESSION: 1. No evidence of pulmonary embolus.
2. Stable mediastinal and right hilar lymphadenopathy.
3. Aortic atherosclerosis.

Aortic Atherosclerosis ([id number]-WS5.5).

## 2022-11-06 ENCOUNTER — Other Ambulatory Visit: Payer: Self-pay | Admitting: Internal Medicine

## 2022-11-07 ENCOUNTER — Encounter: Payer: Self-pay | Admitting: Internal Medicine

## 2022-11-09 ENCOUNTER — Other Ambulatory Visit: Payer: Self-pay | Admitting: Internal Medicine

## 2022-12-05 ENCOUNTER — Encounter: Payer: Self-pay | Admitting: Orthopaedic Surgery

## 2022-12-05 ENCOUNTER — Ambulatory Visit (INDEPENDENT_AMBULATORY_CARE_PROVIDER_SITE_OTHER): Payer: Medicare Other | Admitting: Orthopaedic Surgery

## 2022-12-05 VITALS — BP 136/82 | HR 76 | Ht 68.0 in | Wt 218.0 lb

## 2022-12-05 DIAGNOSIS — M1712 Unilateral primary osteoarthritis, left knee: Secondary | ICD-10-CM | POA: Diagnosis not present

## 2022-12-05 DIAGNOSIS — G5603 Carpal tunnel syndrome, bilateral upper limbs: Secondary | ICD-10-CM | POA: Diagnosis not present

## 2022-12-05 NOTE — Progress Notes (Addendum)
Office Visit Note   Patient: Cody Carney           Date of Birth: [birth year]           MRN: [id number] Visit Date: 12/05/2022              Requested by: Ricard Dillon, NP [address] [place],  [place] [address] PCP: Ricard Dillon, NP   Assessment & Plan: Visit Diagnoses:  1. Unilateral primary osteoarthritis, left knee   2. Bilateral carpal tunnel syndrome     Plan: Patient like proceed with left total knee arthroplasty.  We discussed likely overnight stay in the hospital.  Home physical therapy x 2 weeks to outpatient therapy.  Importance of compliance with therapy and strengthening.  He is likely to have some increased problems with his bilateral carpal tunnel syndrome postop since he will be using a walker.  Will place him in [place] wrist splints that he can use at night currently.  He may need to use the splints postoperatively after his total knee using the walker.  Questions were elicited answered he understands request we proceed.  EMGs nerve conduction velocities reviewed with patient.  Radiographs left knee reviewed and discussed with patient.  Pathophysiology discussed.  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  Follow-Up Instructions: No follow-ups on file.   Orders:  No orders of the defined types were placed in this encounter.  No orders of the defined types were placed in this encounter.     Procedures: No procedures performed   Clinical Data: No additional findings.   Subjective: Chief Complaint  Patient presents with   Right Hand - Numbness   Left Hand - Numbness    HPI patient with bone-on-bone changes left knee post rod removal from his tibia June 2023 in order for him to proceed with a left total knee arthroplasty.  He has bone-on-bone changes failure of anti-inflammatories repetitive cortisone injections.  He has used a cane but is having problems  with bilateral carpal tunnel syndrome worse on the right than the left with positive electrical test.  He states his knee is so painful he wants to do his knee first and then later addresses chronic carpal tunnel syndrome right greater than left hand.  X-rays show bone-on-bone medial compartment marginal osteophytes.  Varus deformity.  Patient is at EMGs nerve conduction velocities at [facility] which showed severe carpal tunnel syndrome worse on the right than left.  Review of Systems positive for history of heart failure.  He was cleared for his hardware removal cardiology last year.   Objective: Vital Signs: BP 136/82   Pulse 76   Ht '5\' 8"'$  (1.727 m)   Wt 218 lb (98.9 kg)   BMI 33.15 kg/m   Physical Exam Constitutional:      Appearance: He is well-developed.  HENT:     Head: Normocephalic and atraumatic.     Right Ear: External ear normal.     Left Ear: External ear normal.  Eyes:     Pupils: Pupils are equal, round, and reactive to light.  Neck:     Thyroid: No thyromegaly.     Trachea: No tracheal deviation.  Cardiovascular:     Rate and Rhythm: Normal rate.  Pulmonary:     Effort: Pulmonary effort is normal.     Breath sounds: No wheezing.  Abdominal:  General: Bowel sounds are normal.     Palpations: Abdomen is soft.  Musculoskeletal:     Cervical back: Neck supple.  Skin:    General: Skin is warm and dry.     Capillary Refill: Capillary refill takes less than 2 seconds.  Neurological:     Mental Status: He is alert and oriented to person, place, and time.  Psychiatric:        Behavior: Behavior normal.        Thought Content: Thought content normal.        Judgment: Judgment normal.     Ortho Exam well-healed oral tibial nail removal incisions interlocks left tibia.  Crepitus knee range of motion 10 to 15 degrees varus deformity negative logroll to hips.  Bilateral carpal compression test bilateral Phalen's.  Thenar atrophy worse on the right  than the left.  Good cervical range of motion no brachial plexus tenderness.  Specialty Comments:  No specialty comments available.  Imaging: No results found.   PMFS History: Patient Active Problem List   Diagnosis Date Noted   Bilateral carpal tunnel syndrome 12/05/2022   Painful orthopaedic hardware [facility])    Patellar dislocation, left, subsequent encounter 12/05/2021   Bennett's fracture of base of metacarpal bone of right thumb 11/03/2021   Clavicle fracture 06/28/2021   Unilateral primary osteoarthritis, left knee 05/30/2021   HFrEF (heart failure with reduced ejection fraction) ([place]) 11/24/2020   Aortic atherosclerosis ([place]) 10/12/2020   Coronary artery calcification 10/12/2020   Mixed hyperlipidemia 10/12/2020   COPD (chronic obstructive pulmonary disease) ([place]) 08/08/2020   Essential hypertension 08/08/2020   Tobacco abuse 08/08/2020   Past Medical History:  Diagnosis Date   Acute exacerbation of CHF (congestive heart failure) ([place]) 08/08/2020   Aortic atherosclerosis ([place]) 10/12/2020   CHF (congestive heart failure) (HCC)    COPD (chronic obstructive pulmonary disease) ([place])    Coronary artery calcification 10/12/2020   Essential hypertension 08/08/2020   HFrEF (heart failure with reduced ejection fraction) ([place]) 11/24/2020   Hypertension    Ischemic chest pain ([place]) 08/08/2020   Mixed hyperlipidemia 10/12/2020   Tobacco abuse 08/08/2020    No family history on file.  Past Surgical History:  Procedure Laterality Date   HARDWARE REMOVAL Left 04/05/2022   Procedure: LEFT TIBIA NAIL AND INTERLOCK SCREWS REMOVAL;  Surgeon: Marybelle Killings, MD;  Location: [place];  Service: Orthopedics;  Laterality: Left;   LEG SURGERY     RIGHT/LEFT HEART CATH AND CORONARY ANGIOGRAPHY N/A 11/26/2020   Procedure: RIGHT/LEFT HEART CATH AND CORONARY ANGIOGRAPHY;  Surgeon: Troy Sine, MD;  Location: [place] [facility];  Service: Cardiovascular;  Laterality: N/A;   Social History    Occupational History   Not on file  Tobacco Use   Smoking status: Every Day    Packs/day: 0.25    Years: 39.00    Total pack years: 9.75    Types: Cigarettes   Smokeless tobacco: Never   Tobacco comments:    encouraged smoking cessation, offered nicotine patch upon DC--relayed msg to primary team  Vaping Use   Vaping Use: Never used  Substance and Sexual Activity   Alcohol use: Not Currently    Alcohol/week: 2.0 standard drinks of alcohol    Types: 2 Cans of beer per week    Comment: per pt statement 2 cans beer on weekend with friends occassionally   Drug use: Not Currently    Types: Marijuana    Comment: Hasn't smoked in a "long time"  Sexual activity: Not Currently

## 2022-12-12 ENCOUNTER — Telehealth: Payer: Self-pay | Admitting: *Deleted

## 2022-12-12 NOTE — Telephone Encounter (Signed)
    Primary Cardiologist:Mahesh A Chandrasekhar, MD  Chart reviewed as part of pre-operative protocol coverage. Because of Shan Figler past medical history and time since last visit, he/she will require a follow-up visit in order to better assess preoperative cardiovascular risk.  Last seen in clinic 01/17/2022.  During that time follow-up plan for 6 months or sooner.  Please schedule office visit  Pre-op covering staff: - Please schedule appointment and call patient to inform them. - Please contact requesting surgeon's office via preferred method (i.e, phone, fax) to inform them of need for appointment prior to surgery.  If applicable, this message will also be routed to pharmacy pool and/or primary cardiologist for input on holding anticoagulant/antiplatelet agent as requested below so that this information is available at time of patient's appointment.   Deberah Pelton, NP  12/12/2022, 1:36 PM

## 2022-12-12 NOTE — Telephone Encounter (Signed)
Patient scheduled to see Nicholes Rough, PA-C on 12/22/22 for preop clearance

## 2022-12-12 NOTE — Telephone Encounter (Signed)
   Pre-operative Risk Assessment    Patient Name: Cody Carney  DOB: 1961/06/23 MRN: [id number]      Request for Surgical Clearance    Procedure:   Left total knee arthroplasty.  Date of Surgery:  Clearance TBD                                 Surgeon:  Dr. Rodell Perna Surgeon's Group or Practice Name:  [facility] Phone number:  [phone] Fax number:  [phone]   Type of Clearance Requested:   - Medical  - Pharmacy:  Hold Clopidogrel (Plavix) Not Indicated   Type of Anesthesia:  Spinal   Additional requests/questions:    Signed, Greer Ee   12/12/2022, 12:52 PM

## 2022-12-22 ENCOUNTER — Ambulatory Visit: Payer: Medicare Other | Admitting: Physician Assistant

## 2022-12-22 NOTE — Progress Notes (Deleted)
Office Visit    Patient Name: Cody Carney Date of Encounter: 12/22/2022  PCP:  Ricard Dillon, NP   [place]  Cardiologist:  Werner Lean, MD  Advanced Practice Provider:  No care team member to display Electrophysiologist:  None    HPI    Cody Carney is a 62 y.o. male  with a past medical history of HTN, aortic atherosclerosis with CAD, COPD, and tobacco abuse, lymph node seen on CT, decompensated heart failure resents today for Preop clearance appointment.  In 2021 his medications were uptitrated.  Patient had increase in Lasix to attempt to hold off admission.  Ended up needing admission eventually and was transition to 80 mg twice daily of Lasix.  Restarted on Aldactone and transitioned from losartan 25 mg daily.  Had been lost to follow-up due to some social issues.  TIA November 2022.  2023 returned to GDMT, smoke-free, LVEF improved to 45%.  Patient had been doing well at that time.  No interval hospitalization or ED visits.  He was last seen 01/17/2022 and at that time had no chest pain or pressure.  No SOB/DOE.  No PND/orthopnea.  Since cutting back to 5 mg of Farxiga had had intermittent increase in hand swelling, weight gain, and abdominal swelling.  This have not improved with extra Lasix.  He had changed his diet but was still having these issues.  Delene Loll was stopped for hypotension.  Today, he***   Past Medical History    Past Medical History:  Diagnosis Date   Acute exacerbation of CHF (congestive heart failure) ([place]) 08/08/2020   Aortic atherosclerosis ([place]) 10/12/2020   CHF (congestive heart failure) (HCC)    COPD (chronic obstructive pulmonary disease) (HCC)    Coronary artery calcification 10/12/2020   Essential hypertension 08/08/2020   HFrEF (heart failure with reduced ejection fraction) ([place]) 11/24/2020   Hypertension    Ischemic chest pain ([place]) 08/08/2020   Mixed hyperlipidemia 10/12/2020   Tobacco abuse 08/08/2020    Past Surgical History:  Procedure Laterality Date   HARDWARE REMOVAL Left 04/05/2022   Procedure: LEFT TIBIA NAIL AND INTERLOCK SCREWS REMOVAL;  Surgeon: Marybelle Killings, MD;  Location: [place];  Service: Orthopedics;  Laterality: Left;   LEG SURGERY     RIGHT/LEFT HEART CATH AND CORONARY ANGIOGRAPHY N/A 11/26/2020   Procedure: RIGHT/LEFT HEART CATH AND CORONARY ANGIOGRAPHY;  Surgeon: Troy Sine, MD;  Location: [place] [facility];  Service: Cardiovascular;  Laterality: N/A;    Allergies  Allergies  Allergen Reactions   Codeine Nausea And Vomiting     EKGs/Labs/Other Studies Reviewed:   The following studies were reviewed today:  Transthoracic Echocardiogram: Date:01/12/22 Results: 1. Improved LVEF - was 35-40% (08/2021), now 45-50%. MR from moderate  decreased now to mild. GLS -11.9. Left ventricular ejection fraction, by  estimation, is 45 to 50%. The left ventricle has mildly decreased  function. The left ventricle has no regional   wall motion abnormalities. Left ventricular diastolic parameters are  consistent with Grade II diastolic dysfunction (pseudonormalization).   2. Right ventricular systolic function is normal. The right ventricular  size is normal. There is normal pulmonary artery systolic pressure.   3. The mitral valve is normal in structure. Mild mitral valve  regurgitation. No evidence of mitral stenosis.   4. The aortic valve is normal in structure. Aortic valve regurgitation is  not visualized. No aortic stenosis is present.   5. The inferior vena cava is normal in  size with greater than 50%  respiratory variability, suggesting right atrial pressure of 3 mmHg.    NonCardiac CT: Date: 08/08/2020 Results: Stable mediastinal lymph node; aortic atherosclerosis, CAC   Left/Right Heart Catheterizations: Date: 12/06/20 Normal coronary arteries with a dominant left circumflex system.   Low/normal right heart pressures/   Findings are compatible with a  nonischemic cardiomyopathy.   RECOMMENDATION: Guideline directed medical therapy for HFrEF.  Smoking cessation.    EKG:  EKG is *** ordered today.  The ekg ordered today demonstrates ***  Recent Labs: 04/05/2022: BUN 16; Creatinine, Ser 0.92; Hemoglobin 14.4; Platelets 149; Potassium 4.5; Sodium 139  Recent Lipid Panel    Component Value Date/Time   CHOL 143 10/21/2021 0902   TRIG 119 10/21/2021 0902   HDL 38 (L) 10/21/2021 0902   CHOLHDL 3.8 10/21/2021 0902   CHOLHDL 7.6 11/26/2020 0027   VLDL UNABLE TO CALCULATE IF TRIGLYCERIDE OVER 400 mg/dL 11/26/2020 0027   LDLCALC 83 10/21/2021 0902   LDLDIRECT 155.3 (H) 11/26/2020 0027    Risk Assessment/Calculations:  {Does this patient have ATRIAL FIBRILLATION?:[id number]}  Home Medications   No outpatient medications have been marked as taking for the 12/22/22 encounter (Appointment) with Elgie Collard, PA-C.   Current Facility-Administered Medications for the 12/22/22 encounter (Appointment) with Elgie Collard, PA-C  Medication   ondansetron [facility]) 4 mg in sodium chloride 0.9 % 50 mL IVPB     Review of Systems   ***   All other systems reviewed and are otherwise negative except as noted above.  Physical Exam    VS:  There were no vitals taken for this visit. , BMI There is no height or weight on file to calculate BMI.  Wt Readings from Last 3 Encounters:  12/05/22 218 lb (98.9 kg)  04/19/22 225 lb (102.1 kg)  04/05/22 220 lb (99.8 kg)     GEN: Well nourished, well developed, in no acute distress. HEENT: normal. Neck: Supple, no JVD, carotid bruits, or masses. Cardiac: ***RRR, no murmurs, rubs, or gallops. No clubbing, cyanosis, edema.  ***Radials/PT 2+ and equal bilaterally.  Respiratory:  ***Respirations regular and unlabored, clear to auscultation bilaterally. GI: Soft, nontender, nondistended. MS: No deformity or atrophy. Skin: Warm and dry, no rash. Neuro:  Strength and sensation are intact. Psych: Normal  affect.  Assessment & Plan    Preop clearance {Click Here to Calculate RCRI      :0000000  { Click Here to Calculate DASI      :VJ:[id number] {Select to add RCRI Risk (<1%=LOW; >/=1%=HIGH) (Optional):21036017}  {Select if HIGH (RCRI >/=1%) Risk (Optional):21036030} Recommendations: {2014 ACC/AHA Perioperative Guidelines  :21036001} Antiplatelet and/or Anticoagulation Recommendations: {Antiplatelet Recommendations                  :21036016} {Anticoagulation Recommendations           :21036019}  HFrEF History of TIA Aortic atherosclerosis CAD Mixed hyperlipidemia Hypertension Tobacco abuse  No BP recorded.  {Refresh Note OR Click here to enter BP  :1}***      Disposition: Follow up {follow up:15908} with Werner Lean, MD or APP.  Signed, Elgie Collard, PA-C 12/22/2022, 10:40 AM [place]

## 2023-02-08 NOTE — Progress Notes (Signed)
Office Visit    Patient Name: Cody Carney Date of Encounter: 02/09/2023  PCP:  Lars Mage, NP   [facility]  Cardiologist:  Christell Constant, MD  Advanced Practice Provider:  No care team member to display Electrophysiologist:  None   HPI    Cody Carney is a 62 y.o. male with a past medical history of hypertension, TIA, aortic atherosclerosis with coronary artery calcifications, COPD and tobacco abuse with lymph node seen on CT scan who originally presented in 08/2020 with decompensated heart failure presents today for follow-up appointment.  In 2021: Uptitrate GDMT, patient had increase in his Lasix to attempt to hold off admission.  Ended up needing admission.  2022: Transition to Lasix 80 mg twice daily restarted spironolactone and transition from losartan 25 mg.  Has been lost to follow-up due to some social issues.  TIA November 2022.  2023: Return to GDMT: Smoke-free, LVEF improved over 45%  Patient was seen April 2023 and was not having any chest pain or pressure.  No shortness of breath or DOE.  No PND orthopnea.  Since cutting back to 5 mg of Farxiga he had had intermittent increases in hand swelling, weight gain, and abdominal swelling.  He had been able to improve with extra Lasix.  He has changed his diet but still having these issues.  These have also occurred since Entresto was stopped for hypotension.  No weight gain or leg swelling.  No palpitations or syncope.  Does not easily bruise on DAPT.  Has some ED.  Today, he presents today for preop clearance.  He tells me he was recently started on metformin 500 mg twice daily for his sugars.  He had a TIA back in November.  Overall, he has been doing well from a cardiac standpoint.  No chest pain but does have shortness of breath which is chronic.  He used to smoke 2 packs a day and is now down to a half a pack a day.  He has tried to quit multiple times.  He is having a difficult time walking due  to his knee but he can go up and down a flight of stairs and he does reach over 4 METS today on the DASI.  He was recently up and down ladders working on a roof.  Since his mini stroke he has helped with a nurse that comes in and organize his medications.  He states he has trouble remembering things.  Some days he is more fluid overloaded than others.  He appears euvolemic on exam today.  Dry weight is 218.  Reports no chest pain, pressure, or tightness. No edema, orthopnea, PND. Reports no palpitations.   Past Medical History    Past Medical History:  Diagnosis Date   Acute exacerbation of CHF (congestive heart failure) (HCC) 08/08/2020   Aortic atherosclerosis (HCC) 10/12/2020   CHF (congestive heart failure) (HCC)    COPD (chronic obstructive pulmonary disease) (HCC)    Coronary artery calcification 10/12/2020   Essential hypertension 08/08/2020   HFrEF (heart failure with reduced ejection fraction) (HCC) 11/24/2020   Hypertension    Ischemic chest pain (HCC) 08/08/2020   Mixed hyperlipidemia 10/12/2020   Tobacco abuse 08/08/2020   Past Surgical History:  Procedure Laterality Date   HARDWARE REMOVAL Left 04/05/2022   Procedure: LEFT TIBIA NAIL AND INTERLOCK SCREWS REMOVAL;  Surgeon: [name], MD;  Location: [facility];  Service: Orthopedics;  Laterality: Left;   LEG SURGERY  RIGHT/LEFT HEART CATH AND CORONARY ANGIOGRAPHY N/A 11/26/2020   Procedure: RIGHT/LEFT HEART CATH AND CORONARY ANGIOGRAPHY;  Surgeon: Lennette Bihari, MD;  Location: [facility];  Service: Cardiovascular;  Laterality: N/A;    Allergies  Allergies  Allergen Reactions   Codeine Nausea And Vomiting    EKGs/Labs/Other Studies Reviewed:   The following studies were reviewed today: Cardiac Studies & Procedures   CARDIAC CATHETERIZATION  CARDIAC CATHETERIZATION 11/26/2020  Narrative Normal coronary arteries with a dominant left circumflex system.  Low/normal right heart pressures/  Findings are  compatible with a nonischemic cardiomyopathy.  RECOMMENDATION: Guideline directed medical therapy for HFrEF.  Smoking cessation.  Findings Coronary Findings Diagnostic  Dominance: Left  No diagnostic findings have been documented. Intervention  No interventions have been documented.     ECHOCARDIOGRAM  ECHOCARDIOGRAM COMPLETE 01/12/2022  Narrative ECHOCARDIOGRAM REPORT    Patient Name:   Cody Carney Date of Exam: 01/12/2022 Medical Rec #:  [id number]  Height:       68.0 in Accession #:    [id number] Weight:       224.0 lb Date of Birth:  17-May-1961  BSA:          2.145 m Patient Age:    60 years   BP:           118/70 mmHg Patient Gender: M          HR:           72 bpm. Exam Location:  [place]  Procedure: 2D Echo, Cardiac Doppler, Color Doppler and Strain Analysis  Indications:    HFrEF (heart failure with reduced ejection fraction) (HCC) [I50.20 (ICD-10-CM)]; Aortic atherosclerosis (HCC) [I70.0 (ICD-10-CM)]  History:        Patient has prior history of Echocardiogram examinations, most recent 08/29/2021. Coronary artery calcification, Aortic atherosclerosis; Risk Factors:Hypertension and Current Smoker.  Sonographer:    Louie Boston RDCS Referring Phys: [id number] [facility] A CHANDRASEKHAR  IMPRESSIONS   1. Improved LVEF - was 35-40% (08/2021), now 45-50%. MR from moderate decreased now to mild. GLS -11.9. Left ventricular ejection fraction, by estimation, is 45 to 50%. The left ventricle has mildly decreased function. The left ventricle has no regional wall motion abnormalities. Left ventricular diastolic parameters are consistent with Grade II diastolic dysfunction (pseudonormalization). 2. Right ventricular systolic function is normal. The right ventricular size is normal. There is normal pulmonary artery systolic pressure. 3. The mitral valve is normal in structure. Mild mitral valve regurgitation. No evidence of mitral stenosis. 4. The aortic valve is normal in  structure. Aortic valve regurgitation is not visualized. No aortic stenosis is present. 5. The inferior vena cava is normal in size with greater than 50% respiratory variability, suggesting right atrial pressure of 3 mmHg.  FINDINGS Left Ventricle: Improved LVEF - was 35-40% (08/2021), now 45-50%. MR from moderate decreased now to mild. GLS -11.9. Left ventricular ejection fraction, by estimation, is 45 to 50%. The left ventricle has mildly decreased function. The left ventricle has no regional wall motion abnormalities. The left ventricular internal cavity size was normal in size. There is borderline left ventricular hypertrophy. Left ventricular diastolic parameters are consistent with Grade II diastolic dysfunction (pseudonormalization).  Right Ventricle: The right ventricular size is normal. No increase in right ventricular wall thickness. Right ventricular systolic function is normal. There is normal pulmonary artery systolic pressure. The tricuspid regurgitant velocity is 1.70 m/s, and with an assumed right atrial pressure of 3 mmHg, the estimated right ventricular systolic pressure is  14.6 mmHg.  Left Atrium: Left atrial size was normal in size.  Right Atrium: Right atrial size was normal in size.  Pericardium: There is no evidence of pericardial effusion.  Mitral Valve: The mitral valve is normal in structure. Mild mitral valve regurgitation. No evidence of mitral valve stenosis.  Tricuspid Valve: The tricuspid valve is normal in structure. Tricuspid valve regurgitation is not demonstrated. No evidence of tricuspid stenosis.  Aortic Valve: The aortic valve is normal in structure. Aortic valve regurgitation is not visualized. No aortic stenosis is present.  Pulmonic Valve: The pulmonic valve was normal in structure. Pulmonic valve regurgitation is not visualized. No evidence of pulmonic stenosis.  Aorta: The aortic root is normal in size and structure.  Venous: The inferior vena  cava is normal in size with greater than 50% respiratory variability, suggesting right atrial pressure of 3 mmHg.  IAS/Shunts: No atrial level shunt detected by color flow Doppler.   LEFT VENTRICLE PLAX 2D LVIDd:         5.50 cm      Diastology LVIDs:         4.60 cm      LV e' medial:    5.87 cm/s LV PW:         1.10 cm      LV E/e' medial:  14.9 LV IVS:        1.10 cm      LV e' lateral:   8.05 cm/s LVOT diam:     2.10 cm      LV E/e' lateral: 10.9 LV SV:         60 LV SV Index:   28 LVOT Area:     3.46 cm  LV Volumes (MOD) LV vol d, MOD A2C: 115.0 ml LV vol d, MOD A4C: 125.0 ml LV vol s, MOD A2C: 60.1 ml LV vol s, MOD A4C: 71.3 ml LV SV MOD A2C:     54.9 ml LV SV MOD A4C:     125.0 ml LV SV MOD BP:      57.6 ml  RIGHT VENTRICLE            IVC RV S prime:     9.14 cm/s  IVC diam: 1.60 cm TAPSE (M-mode): 2.5 cm  LEFT ATRIUM             Index        RIGHT ATRIUM           Index LA diam:        3.90 cm 1.82 cm/m   RA Area:     15.40 cm LA Vol (A2C):   50.0 ml 23.31 ml/m  RA Volume:   40.10 ml  18.70 ml/m LA Vol (A4C):   47.6 ml 22.20 ml/m LA Biplane Vol: 52.6 ml 24.53 ml/m AORTIC VALVE LVOT Vmax:   80.30 cm/s LVOT Vmean:  58.300 cm/s LVOT VTI:    0.172 m  AORTA Ao Root diam: 3.10 cm Ao Asc diam:  3.35 cm Ao Desc diam: 2.00 cm  MITRAL VALVE               TRICUSPID VALVE MV Area (PHT): 3.11 cm    TR Peak grad:   11.6 mmHg MV Decel Time: 244 msec    TR Vmax:        170.00 cm/s MV E velocity: 87.40 cm/s MV A velocity: 92.10 cm/s  SHUNTS MV E/A ratio:  0.95        Systemic  VTI:  0.17 m Systemic Diam: 2.10 cm  Gypsy Balsam MD Electronically signed by Gypsy Balsam MD Signature Date/Time: 01/12/2022/4:02:06 PM    Final              EKG:  EKG is  ordered today.  The ekg ordered today demonstrates normal sinus rhythm, rate 95 bpm (has not taken his carvedilol today).   Recent Labs: 04/05/2022: BUN 16; Creatinine, Ser 0.92; Hemoglobin 14.4; Platelets  149; Potassium 4.5; Sodium 139  Recent Lipid Panel    Component Value Date/Time   CHOL 143 10/21/2021 0902   TRIG 119 10/21/2021 0902   HDL 38 (L) 10/21/2021 0902   CHOLHDL 3.8 10/21/2021 0902   CHOLHDL 7.6 11/26/2020 0027   VLDL UNABLE TO CALCULATE IF TRIGLYCERIDE OVER 400 mg/dL 95/62/1308 6578   LDLCALC 83 10/21/2021 0902   LDLDIRECT 155.3 (H) 11/26/2020 0027     Home Medications   Current Meds  Medication Sig   atorvastatin (LIPITOR) 80 MG tablet Take 80 mg by mouth at bedtime.   carvedilol (COREG) 12.5 MG tablet Take 12.5 mg by mouth 2 (two) times daily with a meal.   cetirizine (ZYRTEC) 10 MG tablet Take 10 mg by mouth daily.   clopidogrel (PLAVIX) 75 MG tablet Take 75 mg by mouth daily.   FARXIGA 10 MG TABS tablet TAKE ONE TABLET BY MOUTH DAILY AT 9AM BEFORE BREAKFAST   furosemide (LASIX) 80 MG tablet Take 2 tablets (160 mg total) by mouth daily.   losartan (COZAAR) 25 MG tablet Take 1 tablet (25 mg total) by mouth daily.   metFORMIN (GLUCOPHAGE) 500 MG tablet Take 500 mg by mouth 2 (two) times daily with a meal.   methocarbamol (ROBAXIN) 500 MG tablet Take 1 tablet (500 mg total) by mouth every 6 (six) hours as needed for muscle spasms.   nicotine polacrilex (EQ NICOTINE POLACRILEX) 4 MG gum Take 1 each (4 mg total) by mouth as needed for smoking cessation.   potassium chloride (KLOR-CON) 10 MEQ tablet Take 1 tablet (10 mEq total) by mouth daily.   sildenafil (VIAGRA) 50 MG tablet Take 1 tablet (50 mg total) by mouth daily as needed for erectile dysfunction.   spironolactone (ALDACTONE) 25 MG tablet Take 25 mg by mouth daily.   traZODone (DESYREL) 100 MG tablet Take 50 mg by mouth at bedtime as needed for sleep.   Current Facility-Administered Medications for the 02/09/23 encounter (Office Visit) with Sharlene Dory, PA-C  Medication   ondansetron [facility]) 4 mg in sodium chloride 0.9 % 50 mL IVPB     Review of Systems      All other systems reviewed and are otherwise  negative except as noted above.  Physical Exam    VS:  BP 137/78 (BP Location: Left Arm, Patient Position: Sitting, Cuff Size: Normal)   Pulse 95   Ht 5\' 8"  (1.727 m)   Wt 225 lb (102.1 kg)   BMI 34.21 kg/m  , BMI Body mass index is 34.21 kg/m.  Wt Readings from Last 3 Encounters:  02/09/23 225 lb (102.1 kg)  12/05/22 218 lb (98.9 kg)  04/19/22 225 lb (102.1 kg)     GEN: Well nourished, well developed, in no acute distress. HEENT: normal. Neck: Supple, no JVD, carotid bruits, or masses. Cardiac: RRR, no murmurs, rubs, or gallops. No clubbing, cyanosis, edema.  Radials/PT 2+ and equal bilaterally.  Respiratory:  Respirations regular and unlabored, clear to auscultation bilaterally. GI: Soft, nontender, nondistended. MS: No deformity or atrophy.  Skin: Warm and dry, no rash. Neuro:  Strength and sensation are intact. Psych: Normal affect.  Assessment & Plan    Preop clearance Mr. Richner perioperative risk of a major cardiac event is 11% according to the Revised Cardiac Risk Index (RCRI).  Therefore, he is at high risk for perioperative complications.   His functional capacity is fair at 4.73 METs according to the [facility] Activity Status Index (DASI). Recommendations: According to ACC/AHA guidelines, no further cardiovascular testing needed.  The patient may proceed to surgery at acceptable risk.   Antiplatelet and/or Anticoagulation Recommendations: Clopidogrel (Plavix) can be held for 5 days prior to his surgery and resumed as soon as possible post op. -patient also with hx of TIA, would get guidance from PCP as well.  Heart failure with reduced ejection fraction -Euvolemic on exam today -Patient does tell me that his dry weight is 218 so he is a few pounds over -He has not taken his 80 of Lasix this morning yet -Continue current diuretic regimen which includes Lasix 80 mg twice a day, Farxiga 10 mg daily, potassium 10 mEq daily  Aortic atherosclerosis/CAD -Continue Plavix 75  mg daily, Lipitor 80 mg daily, carvedilol 12.5 mg twice a day, Farxiga 10 mg daily, Lasix 80 mg twice a day, losartan 25 mg daily, spironolactone 25 mg daily  HLD -Continue current medications -LDL 83, and triglycerides 161  History of TIA -in November  -he is on plavix  Tobacco abuse -Smokes a half a pack a day -he used to smoke 2 packs a day -cessation advised     Disposition: Follow up 1 year with Christell Constant, MD or APP.  Signed, Sharlene Dory, PA-C 02/09/2023, 11:07 AM [facility]

## 2023-02-09 ENCOUNTER — Ambulatory Visit: Payer: Medicare Other | Attending: Physician Assistant | Admitting: Physician Assistant

## 2023-02-09 ENCOUNTER — Encounter: Payer: Self-pay | Admitting: Physician Assistant

## 2023-02-09 VITALS — BP 137/78 | HR 95 | Ht 68.0 in | Wt 225.0 lb

## 2023-02-09 DIAGNOSIS — I700 Atherosclerosis of aorta: Secondary | ICD-10-CM

## 2023-02-09 DIAGNOSIS — Z8673 Personal history of transient ischemic attack (TIA), and cerebral infarction without residual deficits: Secondary | ICD-10-CM

## 2023-02-09 DIAGNOSIS — I2510 Atherosclerotic heart disease of native coronary artery without angina pectoris: Secondary | ICD-10-CM

## 2023-02-09 DIAGNOSIS — Z01810 Encounter for preprocedural cardiovascular examination: Secondary | ICD-10-CM | POA: Diagnosis not present

## 2023-02-09 DIAGNOSIS — Z720 Tobacco use: Secondary | ICD-10-CM

## 2023-02-09 DIAGNOSIS — I5020 Unspecified systolic (congestive) heart failure: Secondary | ICD-10-CM

## 2023-02-09 DIAGNOSIS — I2584 Coronary atherosclerosis due to calcified coronary lesion: Secondary | ICD-10-CM

## 2023-02-09 NOTE — Patient Instructions (Signed)
Medication Instructions:    Your physician recommends that you continue on your current medications as directed. Please refer to the Current Medication list given to you today.   *If you need a refill on your cardiac medications before your next appointment, please call your pharmacy*   Lab Work: NONE ORDERED  TODAY    If you have labs (blood work) drawn today and your tests are completely normal, you will receive your results only by: MyChart Message (if you have MyChart) OR A paper copy in the mail If you have any lab test that is abnormal or we need to change your treatment, we will call you to review the results.   Testing/Procedures: NONE ORDERED  TODAY     Follow-Up: At [facility], you and your health needs are our priority.  As part of our continuing mission to provide you with exceptional heart care, we have created designated Provider Care Teams.  These Care Teams include your primary Cardiologist (physician) and Advanced Practice Providers (APPs -  Physician Assistants and Nurse Practitioners) who all work together to provide you with the care you need, when you need it.  We recommend signing up for the patient portal called "MyChart".  Sign up information is provided on this After Visit Summary.  MyChart is used to connect with patients for Virtual Visits (Telemedicine).  Patients are able to view lab/test results, encounter notes, upcoming appointments, etc.  Non-urgent messages can be sent to your provider as well.   To learn more about what you can do with MyChart, go to [email or web address].    Your next appointment:   1 year(s)  Provider:   Christell Constant, MD     Other Instructions

## 2023-06-07 ENCOUNTER — Other Ambulatory Visit: Payer: Self-pay | Admitting: Physician Assistant

## 2023-06-12 ENCOUNTER — Ambulatory Visit: Payer: 59 | Admitting: Physician Assistant

## 2023-06-12 NOTE — Pre-Procedure Instructions (Addendum)
Surgical Instructions   Your procedure is scheduled on June 29, 2023. Report to [facility] Main Entrance "A" at 7:50 A.M., then check in with the Admitting office. Any questions or running late day of surgery: call [phone]  Questions prior to your surgery date: call [phone], Monday-Friday, 8am-4pm. If you experience any cold or flu symptoms such as cough, fever, chills, shortness of breath, etc. between now and your scheduled surgery, please notify us at the above number.     Remember:  Do not eat after midnight the night before your surgery  You may drink clear liquids until 6:50 AM the morning of your surgery.   Clear liquids allowed are: Water, Non-Citrus Juices (without pulp), Carbonated Beverages, Clear Tea, Black Coffee Only (NO MILK, CREAM OR POWDERED CREAMER of any kind), and Gatorade.  Patient Instructions  The night before surgery:  No food after midnight. ONLY clear liquids after midnight  The day of surgery (if you have diabetes): Drink ONE (1) 12 oz G2 given to you in your pre admission testing appointment by 6:50 AM the morning of surgery. Drink in one sitting. Do not sip.  This drink was given to you during your hospital  pre-op appointment visit.  Nothing else to drink after completing the  12 oz bottle of G2.         If you have questions, please contact your surgeon's office.   Take these medicines the morning of surgery with A SIP OF WATER: carvedilol (COREG)  cetirizine (ZYRTEC)  omeprazole (PRILOSEC)    STOP taking FARXIGA three days prior to surgery. Your last dose will be September 9th.    Follow your surgeon's instructions on when to stop clopidogrel (PLAVIX).  If no instructions were given by your surgeon then you will need to call the office to get those instructions.     One week prior to surgery, STOP taking any Aspirin (unless otherwise instructed by your surgeon) Aleve, Naproxen, Ibuprofen, Motrin, Advil, Goody's, BC's, all  herbal medications, fish oil, and non-prescription vitamins.   WHAT DO I DO ABOUT MY DIABETES MEDICATION?   Do not take metFORMIN (GLUCOPHAGE) the morning of surgery.   HOW TO MANAGE YOUR DIABETES BEFORE AND AFTER SURGERY  Why is it important to control my blood sugar before and after surgery? Improving blood sugar levels before and after surgery helps healing and can limit problems. A way of improving blood sugar control is eating a healthy diet by:  Eating less sugar and carbohydrates  Increasing activity/exercise  Talking with your doctor about reaching your blood sugar goals High blood sugars (greater than 180 mg/dL) can raise your risk of infections and slow your recovery, so you will need to focus on controlling your diabetes during the weeks before surgery. Make sure that the doctor who takes care of your diabetes knows about your planned surgery including the date and location.  How do I manage my blood sugar before surgery? Check your blood sugar at least 4 times a day, starting 2 days before surgery, to make sure that the level is not too high or low.  Check your blood sugar the morning of your surgery when you wake up and every 2 hours until you get to the Short Stay unit.  If your blood sugar is less than 70 mg/dL, you will need to treat for low blood sugar: Do not take insulin. Treat a low blood sugar (less than 70 mg/dL) with  cup of clear juice (cranberry or apple), 4  glucose tablets, OR glucose gel. Recheck blood sugar in 15 minutes after treatment (to make sure it is greater than 70 mg/dL). If your blood sugar is not greater than 70 mg/dL on recheck, call [phone] for further instructions. Report your blood sugar to the short stay nurse when you get to Short Stay.  If you are admitted to the hospital after surgery: Your blood sugar will be checked by the staff and you will probably be given insulin after surgery (instead of oral diabetes medicines) to make sure  you have good blood sugar levels. The goal for blood sugar control after surgery is 80-180 mg/dL.                      Do NOT Smoke (Tobacco/Vaping) for 24 hours prior to your procedure.  If you use a CPAP at night, you may bring your mask/headgear for your overnight stay.   You will be asked to remove any contacts, glasses, piercing's, hearing aid's, dentures/partials prior to surgery. Please bring cases for these items if needed.    Patients discharged the day of surgery will not be allowed to drive home, and someone needs to stay with them for 24 hours.  SURGICAL WAITING ROOM VISITATION Patients may have no more than 2 support people in the waiting area - these visitors may rotate.   Pre-op nurse will coordinate an appropriate time for 1 ADULT support person, who may not rotate, to accompany patient in pre-op.  Children under the age of 63 must have an adult with them who is not the patient and must remain in the main waiting area with an adult.  If the patient needs to stay at the hospital during part of their recovery, the visitor guidelines for inpatient rooms apply.  Please refer to the [facility] website for the visitor guidelines for any additional information.   If you received a COVID test during your pre-op visit  it is requested that you wear a mask when out in public, stay away from anyone that may not be feeling well and notify your surgeon if you develop symptoms. If you have been in contact with anyone that has tested positive in the last 10 days please notify you surgeon.      Pre-operative 5 CHG Bathing Instructions   You can play a key role in reducing the risk of infection after surgery. Your skin needs to be as free of germs as possible. You can reduce the number of germs on your skin by washing with CHG (chlorhexidine gluconate) soap before surgery. CHG is an antiseptic soap that kills germs and continues to kill germs even after washing.   DO NOT use if you  have an allergy to chlorhexidine/CHG or antibacterial soaps. If your skin becomes reddened or irritated, stop using the CHG and notify one of our RNs at [phone].   Please shower with the CHG soap starting 4 days before surgery using the following schedule:     Please keep in mind the following:  DO NOT shave, including legs and underarms, starting the day of your first shower.   You may shave your face at any point before/day of surgery.  Place clean sheets on your bed the day you start using CHG soap. Use a clean washcloth (not used since being washed) for each shower. DO NOT sleep with pets once you start using the CHG.   CHG Shower Instructions:  If you choose to wash your hair and private  area, wash first with your normal shampoo/soap.  After you use shampoo/soap, rinse your hair and body thoroughly to remove shampoo/soap residue.  Turn the water OFF and apply about 3 tablespoons (45 ml) of CHG soap to a CLEAN washcloth.  Apply CHG soap ONLY FROM YOUR NECK DOWN TO YOUR TOES (washing for 3-5 minutes)  DO NOT use CHG soap on face, private areas, open wounds, or sores.  Pay special attention to the area where your surgery is being performed.  If you are having back surgery, having someone wash your back for you may be helpful. Wait 2 minutes after CHG soap is applied, then you may rinse off the CHG soap.  Pat dry with a clean towel  Put on clean clothes/pajamas   If you choose to wear lotion, please use ONLY the CHG-compatible lotions on the back of this paper.   Additional instructions for the day of surgery: DO NOT APPLY any lotions, deodorants, cologne, or perfumes.   Do not bring valuables to the hospital. [facility] is not responsible for any belongings/valuables. Do not wear nail polish, gel polish, artificial nails, or any other type of covering on natural nails (fingers and toes) Do not wear jewelry or makeup Put on clean/comfortable clothes.  Please brush your teeth.   Ask your nurse before applying any prescription medications to the skin.     CHG Compatible Lotions   Aveeno Moisturizing lotion  Cetaphil Moisturizing Cream  Cetaphil Moisturizing Lotion  Clairol Herbal Essence Moisturizing Lotion, Dry Skin  Clairol Herbal Essence Moisturizing Lotion, Extra Dry Skin  Clairol Herbal Essence Moisturizing Lotion, Normal Skin  Curel Age Defying Therapeutic Moisturizing Lotion with Alpha Hydroxy  Curel Extreme Care Body Lotion  Curel Soothing Hands Moisturizing Hand Lotion  Curel Therapeutic Moisturizing Cream, Fragrance-Free  Curel Therapeutic Moisturizing Lotion, Fragrance-Free  Curel Therapeutic Moisturizing Lotion, Original Formula  Eucerin Daily Replenishing Lotion  Eucerin Dry Skin Therapy Plus Alpha Hydroxy Crme  Eucerin Dry Skin Therapy Plus Alpha Hydroxy Lotion  Eucerin Original Crme  Eucerin Original Lotion  Eucerin Plus Crme Eucerin Plus Lotion  Eucerin TriLipid Replenishing Lotion  Keri Anti-Bacterial Hand Lotion  Keri Deep Conditioning Original Lotion Dry Skin Formula Softly Scented  Keri Deep Conditioning Original Lotion, Fragrance Free Sensitive Skin Formula  Keri Lotion Fast Absorbing Fragrance Free Sensitive Skin Formula  Keri Lotion Fast Absorbing Softly Scented Dry Skin Formula  Keri Original Lotion  Keri Skin Renewal Lotion Keri Silky Smooth Lotion  Keri Silky Smooth Sensitive Skin Lotion  Nivea Body Creamy Conditioning Oil  Nivea Body Extra Enriched Lotion  Nivea Body Original Lotion  Nivea Body Sheer Moisturizing Lotion Nivea Crme  Nivea Skin Firming Lotion  NutraDerm 30 Skin Lotion  NutraDerm Skin Lotion  NutraDerm Therapeutic Skin Cream  NutraDerm Therapeutic Skin Lotion  ProShield Protective Hand Cream  Provon moisturizing lotion  Please read over the following fact sheets that you were given.

## 2023-06-13 ENCOUNTER — Other Ambulatory Visit: Payer: Self-pay

## 2023-06-13 ENCOUNTER — Ambulatory Visit: Payer: 59 | Admitting: Physician Assistant

## 2023-06-13 ENCOUNTER — Encounter (HOSPITAL_COMMUNITY): Payer: Self-pay

## 2023-06-13 ENCOUNTER — Ambulatory Visit (HOSPITAL_COMMUNITY)
Admission: RE | Admit: 2023-06-13 | Discharge: 2023-06-13 | Disposition: A | Discharge status: Home / Self Care | Payer: 59 | Source: Ambulatory Visit | Attending: Orthopaedic Surgery | Admitting: Orthopaedic Surgery

## 2023-06-13 VITALS — BP 143/87 | HR 91 | Temp 98.0°F | Resp 18 | Ht 68.0 in | Wt 227.4 lb

## 2023-06-13 DIAGNOSIS — Z01818 Encounter for other preprocedural examination: Secondary | ICD-10-CM | POA: Diagnosis present

## 2023-06-13 DIAGNOSIS — E119 Type 2 diabetes mellitus without complications: Secondary | ICD-10-CM | POA: Diagnosis not present

## 2023-06-13 HISTORY — DX: Gastro-esophageal reflux disease without esophagitis: K21.9

## 2023-06-13 HISTORY — DX: Unspecified osteoarthritis, unspecified site: M19.90

## 2023-06-13 HISTORY — DX: Type 2 diabetes mellitus without complications: E11.9

## 2023-06-13 HISTORY — DX: Carpal tunnel syndrome, unspecified upper limb: G56.00

## 2023-06-13 LAB — GLUCOSE, CAPILLARY: Glucose-Capillary: 88 mg/dL (ref 70–99)

## 2023-06-13 LAB — BASIC METABOLIC PANEL
Anion gap: 7 (ref 5–15)
BUN: 10 mg/dL (ref 6–20)
CO2: 23 mmol/L (ref 22–32)
Calcium: 8.9 mg/dL (ref 8.9–10.3)
Chloride: 108 mmol/L (ref 98–111)
Creatinine, Ser: 0.78 mg/dL (ref 0.61–1.24)
GFR, Estimated: >60 mL/min (ref >60)
Glucose, Bld: 82 mg/dL (ref 70–99)
Potassium: 3.8 mmol/L (ref 3.5–5.1)
Sodium: 138 mmol/L (ref 135–145)

## 2023-06-13 LAB — CBC
HCT: 45 % (ref 39.0–52.0)
Hemoglobin: 14.7 g/dL (ref 13.0–17.0)
MCH: 30.5 pg (ref 26.0–34.0)
MCHC: 32.7 g/dL (ref 30.0–36.0)
MCV: 93.4 fL (ref 80.0–100.0)
Platelets: 148 10*3/uL — ABNORMAL LOW (ref 150–400)
RBC: 4.82 MIL/uL (ref 4.22–5.81)
RDW: 12.4 % (ref 11.5–15.5)
WBC: 7.3 10*3/uL (ref 4.0–10.5)
nRBC: 0 % (ref 0.0–0.2)

## 2023-06-13 LAB — SURGICAL PCR SCREEN
MRSA, PCR: NEGATIVE
Staphylococcus aureus: NEGATIVE

## 2023-06-13 LAB — HEMOGLOBIN A1C
Hgb A1c MFr Bld: 6.8 % — ABNORMAL HIGH (ref 4.8–5.6)
Mean Plasma Glucose: 148.46 mg/dL

## 2023-06-13 NOTE — Progress Notes (Signed)
PCP - Deberah Castle, NP Cardiologist - Dr. Manon Hilding - Last office visit 02/09/2023  PPM/ICD - Denies Device Orders - n/a Rep Notified - n/a  Chest x-ray - Denies EKG - 02/09/2023 Stress Test - Per pt, Dr. Tresa Endo did one in 2020, but unable to find in Epic ECHO - 01/12/2022 Cardiac Cath - 11/26/2020  Sleep Study - STOPBANG score 7.   Pt was not aware that he was diabetic. He was told he was pre-DM. His A1c earlier this year was 6.9. pt was placed on Metformin by PCP. He has a meter but has not used it yet, therefore does not know his fasting blood sugar. CBG at pre-op appointment 88. A1c result pending.  Last dose of GLP1 agonist- n/a GLP1 instructions: n/a  Blood Thinner Instructions: Per surgeon instructions, hold Plavix for 5 days. Last dose will be 9/14. Aspirin Instructions: n/a  ERAS Protcol - Clear liquids until 0650 morning of surgery PRE-SURGERY Ensure or G2- G2 given to pt with instructions  COVID TEST- n/a   Anesthesia review: Yes. Cardiac Clearance.   Patient denies shortness of breath, fever, cough and chest pain at PAT appointment. Pt denies any respiratory illness/infection in the last two months.   All instructions explained to the patient, with a verbal understanding of the material. Patient agrees to go over the instructions while at home for a better understanding. Patient also instructed to self quarantine after being tested for COVID-19. The opportunity to ask questions was provided.

## 2023-06-14 ENCOUNTER — Encounter (HOSPITAL_COMMUNITY): Payer: Self-pay | Admitting: Vascular Surgery

## 2023-06-14 NOTE — Anesthesia Preprocedure Evaluation (Signed)
Anesthesia Evaluation    Airway        Dental   Pulmonary Current Smoker          Cardiovascular hypertension,      Neuro/Psych    GI/Hepatic   Endo/Other  diabetes    Renal/GU      Musculoskeletal   Abdominal   Peds  Hematology   Anesthesia Other Findings   Reproductive/Obstetrics                             Anesthesia Physical Anesthesia Plan  ASA:   Anesthesia Plan:    Post-op Pain Management:    Induction:   PONV Risk Score and Plan:   Airway Management Planned:   Additional Equipment:   Intra-op Plan:   Post-operative Plan:   Informed Consent:   Plan Discussed with:   Anesthesia Plan Comments: (PAT note written 06/14/2023 by Shonna Chock, PA-C.  )       Anesthesia Quick Evaluation

## 2023-06-14 NOTE — Progress Notes (Signed)
Anesthesia Chart Review:  Case: [id number] Date/Time: 06/29/23 0940   Procedure: LEFT TOTAL KNEE ARTHROPLASTY (Left: Knee) - Needs RNFA   Anesthesia type: Spinal   Pre-op diagnosis: left knee osteoarthritis   [facility] ROOM 05 / [facility]   Surgeons: [name], MD       DISCUSSION: Patient is a 62 year old male scheduled for the above procedure.  History includes smoking, HTN, HLD, COPD, DM2, CAD (aortic and coronary calcifications on CT imaging; normal coronaries 11/2020 LHC), non-ischemic cardiomyopathy (11/2020; EF 45-50% 01/2022 echo), HFrEF, CVA (TIA 08/2021, 08/29/21 MRI: no acute abnormality, chronic small vessel ischemic disease with multiple chronic lacunar infarcts). S/p removal of left tibail nail and interlock screws on 04/05/22 for healed left tibial fracture and in preparation of future left TKA. BMI is consistent with obesity. OSA screening score elevated at 7.   He is followed by cardiologist Dr. Izora Ribas for nonischemic cardiomyopathy. LVEF 45-50% by 01/12/22 echo (up from 30-35% 11/2020). Normal coronaries by cath 11/26/2020. He is on Plavix for prior TIA. He had preoperative cardiology evaluation on 02/09/23 by Jari Favre, PA-C. Lasix helping manage volume status. He was euvolemic on exam. No chest pain or pressure. No dyspnea, palpitations, or syncope. One year follow-up planned. In regards to surgery, she wrote, "Mr. Simonich's perioperative risk of a major cardiac event is 11% according to the Revised Cardiac Risk Index (RCRI).  Therefore, he is at high risk for perioperative complications.   His functional capacity is fair at 4.73 METs according to the Duke Activity Status Index (DASI). Recommendations: According to ACC/AHA guidelines, no further cardiovascular testing needed.  The patient may proceed to surgery at acceptable risk.   Antiplatelet and/or Anticoagulation Recommendations: Clopidogrel (Plavix) can be held for 5 days prior to his surgery and resumed as soon as  possible post op. -patient also with hx of TIA, would get guidance from PCP as well." He reported instructions to hold Plavix after 06/23/23 dose. Fargixa to be held for surgery after 06/25/23.   Anesthesia team to evaluate on the day of surgery.    VS:  Wt Readings from Last 3 Encounters:  06/13/23 103.1 kg  02/09/23 102.1 kg  12/05/22 98.9 kg   BP Readings from Last 3 Encounters:  06/13/23 (!) 143/87  02/09/23 137/78  12/05/22 136/82   Pulse Readings from Last 3 Encounters:  06/13/23 91  02/09/23 95  12/05/22 76     PROVIDERS: Lars Mage, NP is PCP  Riley Lam, MD is cardiologist   LABS: Labs reviewed: Acceptable for surgery. (all labs ordered are listed, but only abnormal results are displayed) Lab Results  Component Value Date   WBC 7.3 06/13/2023   HGB 14.7 06/13/2023   HCT 45.0 06/13/2023   PLT 148 (L) 06/13/2023   GLUCOSE 82 06/13/2023   NA 138 06/13/2023   K 3.8 06/13/2023   CL 108 06/13/2023   CREATININE 0.78 06/13/2023   BUN 10 06/13/2023   CO2 23 06/13/2023   HGBA1C 6.8 (H) 06/13/2023     IMAGES: CT Chest LCS 10/31/22 (Canopy/PACS): IMPRESSION: 1. Lung-RADS 2S, benign appearance or behavior. Continue annual screening with low-dose chest CT without contrast in 12 months. 2. The "S" modifier above refers to potentially clinically significant non lung cancer related findings. Specifically, there is aortic atherosclerosis, in addition to left main and three-vessel coronary artery disease. Please note that although the presence of coronary artery calcium documents the presence of coronary artery disease, the severity of this disease  and any potential stenosis cannot be assessed on this non-gated CT examination. Assessment for potential risk factor modification, dietary therapy or pharmacologic therapy may be warranted, if clinically indicated. 3. Mild diffuse bronchial wall thickening with mild centrilobular and paraseptal emphysema;  imaging findings suggestive of underlying COPD. - Aortic Atherosclerosis (ICD10-I70.0) and Emphysema (ICD10-J43.9).  MRI Brain 08/29/21 (Canopy/PACS): IMPRESSION: 1. No acute intracranial abnormality. 2. Moderate chronic small vessel ischemic disease with multiple chronic lacunar infarcts.    EKG: 02/09/2023: Normal sinus rhythm   CV: Echo 01/12/22: IMPRESSIONS   1. Improved LVEF - was 35-40% (08/2021), now 45-50%. MR from moderate  decreased now to mild. GLS -11.9. Left ventricular ejection fraction, by  estimation, is 45 to 50%. The left ventricle has mildly decreased  function. The left ventricle has no regional   wall motion abnormalities. Left ventricular diastolic parameters are  consistent with Grade II diastolic dysfunction (pseudonormalization).   2. Right ventricular systolic function is normal. The right ventricular  size is normal. There is normal pulmonary artery systolic pressure.   3. The mitral valve is normal in structure. Mild mitral valve  regurgitation. No evidence of mitral stenosis.   4. The aortic valve is normal in structure. Aortic valve regurgitation is  not visualized. No aortic stenosis is present.   5. The inferior vena cava is normal in size with greater than 50%  respiratory variability, suggesting right atrial pressure of 3 mmHg.  - Comparison: 11/25/20: LVEF 30-35%, LV global hypokinesis, mildly to moderately dilated LV internal cavity, normal RV systolic function, trivial MR   US Carotid 10/26/21 [facility], scanned under Results Review): Impression: 1.  Right carotid artery system: Less than 50% stenosis secondary to mild multifocal atherosclerosis plaque formation. 2.  Left carotid artery system: Less than 50% stenosis secondary to mild multifocal atherosclerotic plaque formation. 3.  Vertebral artery systems: Patent with antegrade flow bilaterally.   Cardiac cath 11/26/20: Normal coronary arteries with a dominant left circumflex  system. Low/normal right heart pressures Findings are compatible with a nonischemic cardiomyopathy.   RECOMMENDATION: Guideline directed medical therapy for HFrEF.  Smoking cessation.    Past Medical History:  Diagnosis Date   Acute exacerbation of CHF (congestive heart failure) (HCC) 08/08/2020   Aortic atherosclerosis (HCC) 10/12/2020   Arthritis    Left Knee   Carpal tunnel syndrome    Bilateral   CHF (congestive heart failure) (HCC)    COPD (chronic obstructive pulmonary disease) (HCC)    Coronary artery calcification 10/12/2020   Diabetes mellitus without complication (HCC)    Type 2   Essential hypertension 08/08/2020   GERD (gastroesophageal reflux disease)    HFrEF (heart failure with reduced ejection fraction) (HCC) 11/24/2020   Hypertension    Ischemic chest pain (HCC) 08/08/2020   Mixed hyperlipidemia 10/12/2020   Stroke (HCC) 2023   TIA   Tobacco abuse 08/08/2020    Past Surgical History:  Procedure Laterality Date   HARDWARE REMOVAL Left 04/05/2022   Procedure: LEFT TIBIA NAIL AND INTERLOCK SCREWS REMOVAL;  Surgeon: [name], MD;  Location: [facility] OR;  Service: Orthopedics;  Laterality: Left;   LEG SURGERY Left    Fracture Surgery   RIGHT/LEFT HEART CATH AND CORONARY ANGIOGRAPHY N/A 11/26/2020   Procedure: RIGHT/LEFT HEART CATH AND CORONARY ANGIOGRAPHY;  Surgeon: Lennette Bihari, MD;  Location: [facility] INVASIVE CV LAB;  Service: Cardiovascular;  Laterality: N/A;    MEDICATIONS:  ondansetron (ZOFRAN) 4 mg in sodium chloride 0.9 % 50 mL IVPB  atorvastatin (LIPITOR) 80 MG tablet   carvedilol (COREG) 12.5 MG tablet   cetirizine (ZYRTEC) 10 MG tablet   Cholecalciferol (VITAMIN D3) 1000 units CAPS   clopidogrel (PLAVIX) 75 MG tablet   FARXIGA 10 MG TABS tablet   furosemide (LASIX) 80 MG tablet   gabapentin (NEURONTIN) 300 MG capsule   metFORMIN (GLUCOPHAGE) 500 MG tablet   naproxen sodium (ALEVE) 220 MG tablet   omeprazole (PRILOSEC) 20 MG capsule    potassium chloride (KLOR-CON) 10 MEQ tablet   sildenafil (VIAGRA) 50 MG tablet   spironolactone (ALDACTONE) 25 MG tablet     Shonna Chock, PA-C Surgical Short Stay/Anesthesiology [facility] Phone [phone] [facility] Phone [phone] 06/14/2023 3:28 PM

## 2023-06-15 ENCOUNTER — Telehealth: Payer: Self-pay

## 2023-06-15 NOTE — Telephone Encounter (Signed)
Receive paperwork from [employer] that pt received a temporary supply of dapagliflozin 10 mg.  This drug is not on formulary list or has restrictions.  Please send prior auth for this pt to continue on medication.

## 2023-06-18 ENCOUNTER — Telehealth: Payer: Self-pay | Admitting: Pharmacy Technician

## 2023-06-18 ENCOUNTER — Other Ambulatory Visit (HOSPITAL_COMMUNITY): Payer: Self-pay

## 2023-06-18 NOTE — Telephone Encounter (Signed)
Prior Berkley Harvey is not needed for the medication. See rejection below:

## 2023-06-18 NOTE — Telephone Encounter (Signed)
Pharmacy Patient Advocate Encounter   Received notification from Pt Calls Messages that prior authorization for farxiga is required/requested.   Insurance verification completed.   The patient is insured through [facility] .   Per test claim: PA required; PA submitted to [facility] via CoverMyMeds Key/confirmation #/EOC [id number] Status is pending

## 2023-06-18 NOTE — Telephone Encounter (Signed)
PA request has been Submitted. New Encounter created for follow up. For additional info see Pharmacy Prior Auth telephone encounter from 06/18/23.

## 2023-06-18 NOTE — Telephone Encounter (Signed)
Pharmacy Patient Advocate Encounter   Received notification from Pt Calls Messages that prior authorization for farxiga is required/requested.   Insurance verification completed.   The patient is insured through [facility] .   Per test claim: PA required; PA started via CoverMyMeds. KEY [id number] . Waiting for clinical questions to populate.

## 2023-06-18 NOTE — Telephone Encounter (Signed)
Please do PA. Patient got a temporary fill from his insurance during a grace period. He will need PA once he runs out.

## 2023-06-18 NOTE — Telephone Encounter (Signed)
Pharmacy Patient Advocate Encounter  Received notification from [facility] that Prior Authorization for farxiga has been CANCELLED due to notification from insurance refill too soon. Next available refill 06/28/23   PA #/Case ID/Reference #: [id number]

## 2023-06-27 ENCOUNTER — Encounter: Payer: Self-pay | Admitting: Physician Assistant

## 2023-06-27 ENCOUNTER — Ambulatory Visit (INDEPENDENT_AMBULATORY_CARE_PROVIDER_SITE_OTHER): Payer: 59 | Admitting: Physician Assistant

## 2023-06-27 VITALS — BP 135/88 | HR 89

## 2023-06-27 DIAGNOSIS — M1712 Unilateral primary osteoarthritis, left knee: Secondary | ICD-10-CM

## 2023-06-27 NOTE — Progress Notes (Signed)
Office Visit Note   Patient: Cody Carney           Date of Birth: January 26, 1961           MRN: [id number] Visit Date: 06/27/2023              Requested by: Lars Mage, NP [address] [place],  [place] [address] PCP: Lars Mage, NP  No chief complaint on file.     HPI: Cody Carney is a pleasant 62 year old gentleman with a history of left knee arthritis.  He has failed conservative treatment.  After an extensive discussion with Dr. Ophelia Charter he is going to go forward with a left total knee arthroplasty.  His health has been stable  Assessment & Plan: Visit Diagnoses:  1. Unilateral primary osteoarthritis, left knee     Plan: Will go forward with left total knee arthroplasty wrist procedure were again reviewed including bleeding infection anesthesia complications neurovascular damage need for future surgery full H&P dictated into the hospital system.  Patient knows he is at increased risk for wound complications anesthesia complications and blood clots because of his smoking.  He was smoking 2 packs daily now he is down to 1.  He is going to try to reduce this even further during his recovery  Follow-Up Instructions: Return in about 1 week (around 07/04/2023).   Ortho Exam  Patient is alert, oriented, no adenopathy, well-dressed, normal affect, normal respiratory effort. well-healed oral tibial nail removal incisions interlocks left tibia. Crepitus knee range of motion 10 to 15 degrees varus deformity negative logroll to hips   Imaging: No results found. No images are attached to the encounter.  Labs: Lab Results  Component Value Date   HGBA1C 6.8 (H) 06/13/2023   HGBA1C 5.8 (H) 11/26/2020     No results found for: "ALBUMIN", "PREALBUMIN", "CBC"  Lab Results  Component Value Date   MG 1.8 10/22/2020   No results found for: "VD25OH"  No results found for: "PREALBUMIN"    Latest Ref Rng & Units 06/13/2023    1:40 PM 04/05/2022   10:47 AM 10/21/2021    9:02 AM  CBC  EXTENDED  WBC 4.0 - 10.5 K/uL 7.3  7.0  8.8   RBC 4.22 - 5.81 MIL/uL 4.82  4.37  4.41   Hemoglobin 13.0 - 17.0 g/dL 72.5  36.6  44.0   HCT 39.0 - 52.0 % 45.0  40.5  38.6   Platelets 150 - 400 K/uL 148  149  197      There is no height or weight on file to calculate BMI.  Orders:  No orders of the defined types were placed in this encounter.  No orders of the defined types were placed in this encounter.    Procedures: No procedures performed  Clinical Data: No additional findings.  ROS:  All other systems negative, except as noted in the HPI. Review of Systems  Objective: Vital Signs: There were no vitals taken for this visit.  Specialty Comments:  No specialty comments available.  PMFS History: Patient Active Problem List   Diagnosis Date Noted   Bilateral carpal tunnel syndrome 12/05/2022   Painful orthopaedic hardware [facility])    Patellar dislocation, left, subsequent encounter 12/05/2021   Bennett's fracture of base of metacarpal bone of right thumb 11/03/2021   Clavicle fracture 06/28/2021   Unilateral primary osteoarthritis, left knee 05/30/2021   HFrEF (heart failure with reduced ejection fraction) (HCC) 11/24/2020   Aortic atherosclerosis (HCC) 10/12/2020  Coronary artery calcification 10/12/2020   Mixed hyperlipidemia 10/12/2020   COPD (chronic obstructive pulmonary disease) (HCC) 08/08/2020   Essential hypertension 08/08/2020   Tobacco abuse 08/08/2020   Past Medical History:  Diagnosis Date   Acute exacerbation of CHF (congestive heart failure) (HCC) 08/08/2020   Aortic atherosclerosis (HCC) 10/12/2020   Arthritis    Left Knee   Carpal tunnel syndrome    Bilateral   CHF (congestive heart failure) (HCC)    COPD (chronic obstructive pulmonary disease) (HCC)    Coronary artery calcification 10/12/2020   Diabetes mellitus without complication (HCC)    Type 2   Essential hypertension 08/08/2020   GERD (gastroesophageal reflux disease)    HFrEF  (heart failure with reduced ejection fraction) (HCC) 11/24/2020   Hypertension    Ischemic chest pain (HCC) 08/08/2020   Mixed hyperlipidemia 10/12/2020   Stroke (HCC) 2023   TIA   Tobacco abuse 08/08/2020    History reviewed. No pertinent family history.  Past Surgical History:  Procedure Laterality Date   HARDWARE REMOVAL Left 04/05/2022   Procedure: LEFT TIBIA NAIL AND INTERLOCK SCREWS REMOVAL;  Surgeon: [name], MD;  Location: [facility] OR;  Service: Orthopedics;  Laterality: Left;   LEG SURGERY Left    Fracture Surgery   RIGHT/LEFT HEART CATH AND CORONARY ANGIOGRAPHY N/A 11/26/2020   Procedure: RIGHT/LEFT HEART CATH AND CORONARY ANGIOGRAPHY;  Surgeon: Lennette Bihari, MD;  Location: [facility] INVASIVE CV LAB;  Service: Cardiovascular;  Laterality: N/A;   Social History   Occupational History   Not on file  Tobacco Use   Smoking status: Every Day    Current packs/day: 0.25    Average packs/day: 0.3 packs/day for 39.0 years (9.8 ttl pk-yrs)    Types: Cigarettes   Smokeless tobacco: Never   Tobacco comments:    encouraged smoking cessation, offered nicotine patch upon DC--relayed msg to primary team  Vaping Use   Vaping status: Never Used  Substance and Sexual Activity   Alcohol use: Not Currently    Alcohol/week: 2.0 standard drinks of alcohol    Types: 2 Cans of beer per week    Comment: per pt statement 2 cans beer on weekend with friends occassionally   Drug use: Not Currently    Types: Marijuana    Comment: Hasn't smoked in a "long time"   Sexual activity: Not Currently

## 2023-06-27 NOTE — H&P (Signed)
TOTAL KNEE ADMISSION H&P  Patient is being admitted for left total knee arthroplasty.  Subjective:  Chief Complaint:left knee pain.  HPI: Cody Carney, 62 y.o. male, has a history of pain and functional disability in the left knee due to osteoarthritis and has failed non-surgical conservative treatments for greater than 12 weeks to includeNSAID's and/or analgesics, corticosteriod injections, and activity modification.  Onset of symptoms was gradual, starting years ago with rapidlly worsening course since that time. The patient noted prior procedures on the knee to include  intramedullary rodding of left tibia fracture with removal of rod  on the left knee(s).  Patient currently rates pain in the left knee(s) at 9 out of 10 with activity. Patient has night pain, worsening of pain with activity and weight bearing, and pain that interferes with activities of daily living.  Patient has evidence of periarticular osteophytes and joint space narrowing by imaging studies. This patient has had    . There is no active infection.  Patient Active Problem List   Diagnosis Date Noted   Bilateral carpal tunnel syndrome 12/05/2022   Painful orthopaedic hardware [facility])    Patellar dislocation, left, subsequent encounter 12/05/2021   Bennett's fracture of base of metacarpal bone of right thumb 11/03/2021   Clavicle fracture 06/28/2021   Unilateral primary osteoarthritis, left knee 05/30/2021   HFrEF (heart failure with reduced ejection fraction) (HCC) 11/24/2020   Aortic atherosclerosis (HCC) 10/12/2020   Coronary artery calcification 10/12/2020   Mixed hyperlipidemia 10/12/2020   COPD (chronic obstructive pulmonary disease) (HCC) 08/08/2020   Essential hypertension 08/08/2020   Tobacco abuse 08/08/2020   Past Medical History:  Diagnosis Date   Acute exacerbation of CHF (congestive heart failure) (HCC) 08/08/2020   Aortic atherosclerosis (HCC) 10/12/2020   Arthritis    Left Knee   Carpal tunnel syndrome     Bilateral   CHF (congestive heart failure) (HCC)    COPD (chronic obstructive pulmonary disease) (HCC)    Coronary artery calcification 10/12/2020   Diabetes mellitus without complication (HCC)    Type 2   Essential hypertension 08/08/2020   GERD (gastroesophageal reflux disease)    HFrEF (heart failure with reduced ejection fraction) (HCC) 11/24/2020   Hypertension    Ischemic chest pain (HCC) 08/08/2020   Mixed hyperlipidemia 10/12/2020   Stroke (HCC) 2023   TIA   Tobacco abuse 08/08/2020    Past Surgical History:  Procedure Laterality Date   HARDWARE REMOVAL Left 04/05/2022   Procedure: LEFT TIBIA NAIL AND INTERLOCK SCREWS REMOVAL;  Surgeon: [name], MD;  Location: [facility] OR;  Service: Orthopedics;  Laterality: Left;   LEG SURGERY Left    Fracture Surgery   RIGHT/LEFT HEART CATH AND CORONARY ANGIOGRAPHY N/A 11/26/2020   Procedure: RIGHT/LEFT HEART CATH AND CORONARY ANGIOGRAPHY;  Surgeon: Lennette Bihari, MD;  Location: [facility] INV[facility] CV LAB;  Service: Cardiovascular;  Laterality: N/A;    Current Outpatient Medications  Medication Sig Dispense Refill Last Dose   atorvastatin (LIPITOR) 80 MG tablet Take 80 mg by mouth at bedtime.      carvedilol (COREG) 12.5 MG tablet Take 12.5 mg by mouth 2 (two) times daily with a meal.      cetirizine (ZYRTEC) 10 MG tablet Take 10 mg by mouth daily.      Cholecalciferol (VITAMIN D3) 1000 units CAPS Take 1,000 Units by mouth daily.      clopidogrel (PLAVIX) 75 MG tablet Take 75 mg by mouth daily.      FARXIGA 10 MG TABS  tablet TAKE ONE TABLET BY MOUTH DAILY AT 9AM BEFORE BREAKFAST 150 tablet 11    furosemide (LASIX) 80 MG tablet Take 2 tablets (160 mg total) by mouth daily. 180 tablet 3    gabapentin (NEURONTIN) 300 MG capsule Take 300 mg by mouth at bedtime.      metFORMIN (GLUCOPHAGE) 500 MG tablet Take 500 mg by mouth 2 (two) times daily with a meal.      naproxen sodium (ALEVE) 220 MG tablet Take 660 mg by mouth daily as needed (pain).       omeprazole (PRILOSEC) 20 MG capsule Take 20 mg by mouth daily.      potassium chloride (KLOR-CON) 10 MEQ tablet Take 1 tablet (10 mEq total) by mouth daily. 90 tablet 3    sildenafil (VIAGRA) 50 MG tablet Take 1 tablet (50 mg total) by mouth daily as needed for erectile dysfunction. 20 tablet 1    spironolactone (ALDACTONE) 25 MG tablet Take 12.5 mg by mouth at bedtime. 45 tablet 3    Current Facility-Administered Medications  Medication Dose Route Frequency Provider Last Rate Last Admin   ondansetron (ZOFRAN) 4 mg in sodium chloride 0.9 % 50 mL IVPB  4 mg Intravenous Q6H PRN Chandrasekhar, Mahesh A, MD       Allergies  Allergen Reactions   Codeine Nausea And Vomiting    Social History   Tobacco Use   Smoking status: Every Day    Current packs/day: 0.25    Average packs/day: 0.3 packs/day for 39.0 years (9.8 ttl pk-yrs)    Types: Cigarettes   Smokeless tobacco: Never   Tobacco comments:    encouraged smoking cessation, offered nicotine patch upon DC--relayed msg to primary team  Substance Use Topics   Alcohol use: Not Currently    Alcohol/week: 2.0 standard drinks of alcohol    Types: 2 Cans of beer per week    Comment: per pt statement 2 cans beer on weekend with friends occassionally    History reviewed. No pertinent family history.   Review of Systems  Respiratory:  Choking: 1.   All other systems reviewed and are negative.   Objective:  Physical Exam     Objective: Vital Signs: BP 136/82   Pulse 76   Ht 5\' 8"  (1.727 m)   Wt 218 lb (98.9 kg)   BMI 33.15 kg/m    Physical Exam Constitutional:      Appearance: He is well-developed.  HENT:     Head: Normocephalic and atraumatic.     Right Ear: External ear normal.     Left Ear: External ear normal.  Eyes:     Pupils: Pupils are equal, round, and reactive to light.  Neck:     Thyroid: No thyromegaly.     Trachea: No tracheal deviation.  Cardiovascular:     Rate and Rhythm: Normal rate.  Pulmonary:      Effort: Pulmonary effort is normal.     Breath sounds: No wheezing.  Abdominal:     General: Bowel sounds are normal.     Palpations: Abdomen is soft.  Musculoskeletal:     Cervical back: Neck supple.  Skin:    General: Skin is warm and dry.     Capillary Refill: Capillary refill takes less than 2 seconds.  Neurological:     Mental Status: He is alert and oriented to person, place, and time.  Psychiatric:        Behavior: Behavior normal.        Thought  Content: Thought content normal.        Judgment: Judgment normal.   Ortho Exam well-healed oral tibial nail removal incisions interlocks left tibia.  Crepitus knee range of motion 10 to 15 degrees varus deformity negative logroll to hips.  Vital signs in last 24 hours: @VSRANGES @  Labs:   Estimated body mass index is 34.58 kg/m as calculated from the following:   Height as of 06/13/23: 5\' 8"  (1.727 m).   Weight as of 06/13/23: 227 lb 6.4 oz (103.1 kg).   Imaging Review Plain radiographs demonstrate severe degenerative joint disease of the left knee(s). The overall alignment isneutral. The bone quality appears to be excellent for age and reported activity level.      Assessment/Plan:  End stage arthritis, left knee   The patient history, physical examination, clinical judgment of the provider and imaging studies are consistent with end stage degenerative joint disease of the left knee(s) and total knee arthroplasty is deemed medically necessary. The treatment options including medical management, injection therapy arthroscopy and arthroplasty were discussed at length. The risks and benefits of total knee arthroplasty were presented and reviewed. The risks due to aseptic loosening, infection, stiffness, patella tracking problems, thromboembolic complications and other imponderables were discussed. The patient acknowledged the explanation, agreed to proceed with the plan and consent was signed. Patient is being admitted for  inpatient treatment for surgery, pain control, PT, OT, prophylactic antibiotics, VTE prophylaxis, progressive ambulation and ADL's and discharge planning. The patient is planning to be discharged home with home health services     Patient's anticipated LOS is less than 2 midnights, meeting these requirements: - Younger than 20 - Lives within 1 hour of care - Has a competent adult at home to recover with post-op recover - NO history of  - Chronic pain requiring opiods  - Diabetes  - Coronary Artery Disease  - Heart failure  - Heart attack  - Stroke  - DVT/VTE  - Cardiac arrhythmia  - Respiratory Failure/COPD  - Renal failure  - Anemia  - Advanced Liver disease

## 2023-06-27 NOTE — H&P (View-Only) (Signed)
TOTAL KNEE ADMISSION H&P  Patient is being admitted for left total knee arthroplasty.  Subjective:  Chief Complaint:left knee pain.  HPI: Cody Carney, 62 y.o. male, has a history of pain and functional disability in the left knee due to osteoarthritis and has failed non-surgical conservative treatments for greater than 12 weeks to includeNSAID's and/or analgesics, corticosteriod injections, and activity modification.  Onset of symptoms was gradual, starting years ago with rapidlly worsening course since that time. The patient noted prior procedures on the knee to include  intramedullary rodding of left tibia fracture with removal of rod  on the left knee(s).  Patient currently rates pain in the left knee(s) at 9 out of 10 with activity. Patient has night pain, worsening of pain with activity and weight bearing, and pain that interferes with activities of daily living.  Patient has evidence of periarticular osteophytes and joint space narrowing by imaging studies. This patient has had    . There is no active infection.  Patient Active Problem List   Diagnosis Date Noted   Bilateral carpal tunnel syndrome 12/05/2022   Painful orthopaedic hardware [facility])    Patellar dislocation, left, subsequent encounter 12/05/2021   Bennett's fracture of base of metacarpal bone of right thumb 11/03/2021   Clavicle fracture 06/28/2021   Unilateral primary osteoarthritis, left knee 05/30/2021   HFrEF (heart failure with reduced ejection fraction) (HCC) 11/24/2020   Aortic atherosclerosis (HCC) 10/12/2020   Coronary artery calcification 10/12/2020   Mixed hyperlipidemia 10/12/2020   COPD (chronic obstructive pulmonary disease) (HCC) 08/08/2020   Essential hypertension 08/08/2020   Tobacco abuse 08/08/2020   Past Medical History:  Diagnosis Date   Acute exacerbation of CHF (congestive heart failure) (HCC) 08/08/2020   Aortic atherosclerosis (HCC) 10/12/2020   Arthritis    Left Knee   Carpal tunnel syndrome     Bilateral   CHF (congestive heart failure) (HCC)    COPD (chronic obstructive pulmonary disease) (HCC)    Coronary artery calcification 10/12/2020   Diabetes mellitus without complication (HCC)    Type 2   Essential hypertension 08/08/2020   GERD (gastroesophageal reflux disease)    HFrEF (heart failure with reduced ejection fraction) (HCC) 11/24/2020   Hypertension    Ischemic chest pain (HCC) 08/08/2020   Mixed hyperlipidemia 10/12/2020   Stroke (HCC) 2023   TIA   Tobacco abuse 08/08/2020    Past Surgical History:  Procedure Laterality Date   HARDWARE REMOVAL Left 04/05/2022   Procedure: LEFT TIBIA NAIL AND INTERLOCK SCREWS REMOVAL;  Surgeon: [name], MD;  Location: [facility] OR;  Service: Orthopedics;  Laterality: Left;   LEG SURGERY Left    Fracture Surgery   RIGHT/LEFT HEART CATH AND CORONARY ANGIOGRAPHY N/A 11/26/2020   Procedure: RIGHT/LEFT HEART CATH AND CORONARY ANGIOGRAPHY;  Surgeon: Lennette Bihari, MD;  Location: [facility] INV[facility] LAB;  Service: Cardiovascular;  Laterality: N/A;    Current Outpatient Medications  Medication Sig Dispense Refill Last Dose   atorvastatin (LIPITOR) 80 MG tablet Take 80 mg by mouth at bedtime.      carvedilol (COREG) 12.5 MG tablet Take 12.5 mg by mouth 2 (two) times daily with a meal.      cetirizine (ZYRTEC) 10 MG tablet Take 10 mg by mouth daily.      Cholecalciferol (VITAMIN D3) 1000 units CAPS Take 1,000 Units by mouth daily.      clopidogrel (PLAVIX) 75 MG tablet Take 75 mg by mouth daily.      FARXIGA 10 MG TABS  tablet TAKE ONE TABLET BY MOUTH DAILY AT 9AM BEFORE BREAKFAST 150 tablet 11    furosemide (LASIX) 80 MG tablet Take 2 tablets (160 mg total) by mouth daily. 180 tablet 3    gabapentin (NEURONTIN) 300 MG capsule Take 300 mg by mouth at bedtime.      metFORMIN (GLUCOPHAGE) 500 MG tablet Take 500 mg by mouth 2 (two) times daily with a meal.      naproxen sodium (ALEVE) 220 MG tablet Take 660 mg by mouth daily as needed (pain).       omeprazole (PRILOSEC) 20 MG capsule Take 20 mg by mouth daily.      potassium chloride (KLOR-CON) 10 MEQ tablet Take 1 tablet (10 mEq total) by mouth daily. 90 tablet 3    sildenafil (VIAGRA) 50 MG tablet Take 1 tablet (50 mg total) by mouth daily as needed for erectile dysfunction. 20 tablet 1    spironolactone (ALDACTONE) 25 MG tablet Take 12.5 mg by mouth at bedtime. 45 tablet 3    Current Facility-Administered Medications  Medication Dose Route Frequency Provider Last Rate Last Admin   ondansetron (ZOFRAN) 4 mg in sodium chloride 0.9 % 50 mL IVPB  4 mg Intravenous Q6H PRN Chandrasekhar, Mahesh A, MD       Allergies  Allergen Reactions   Codeine Nausea And Vomiting    Social History   Tobacco Use   Smoking status: Every Day    Current packs/day: 0.25    Average packs/day: 0.3 packs/day for 39.0 years (9.8 ttl pk-yrs)    Types: Cigarettes   Smokeless tobacco: Never   Tobacco comments:    encouraged smoking cessation, offered nicotine patch upon DC--relayed msg to primary team  Substance Use Topics   Alcohol use: Not Currently    Alcohol/week: 2.0 standard drinks of alcohol    Types: 2 Cans of beer per week    Comment: per pt statement 2 cans beer on weekend with friends occassionally    History reviewed. No pertinent family history.   Review of Systems  Respiratory:  Choking: 1.   All other systems reviewed and are negative.   Objective:  Physical Exam     Objective: Vital Signs: BP 136/82   Pulse 76   Ht 5\' 8"  (1.727 m)   Wt 218 lb (98.9 kg)   BMI 33.15 kg/m    Physical Exam Constitutional:      Appearance: He is well-developed.  HENT:     Head: Normocephalic and atraumatic.     Right Ear: External ear normal.     Left Ear: External ear normal.  Eyes:     Pupils: Pupils are equal, round, and reactive to light.  Neck:     Thyroid: No thyromegaly.     Trachea: No tracheal deviation.  Cardiovascular:     Rate and Rhythm: Normal rate.  Pulmonary:      Effort: Pulmonary effort is normal.     Breath sounds: No wheezing.  Abdominal:     General: Bowel sounds are normal.     Palpations: Abdomen is soft.  Musculoskeletal:     Cervical back: Neck supple.  Skin:    General: Skin is warm and dry.     Capillary Refill: Capillary refill takes less than 2 seconds.  Neurological:     Mental Status: He is alert and oriented to person, place, and time.  Psychiatric:        Behavior: Behavior normal.        Thought  Content: Thought content normal.        Judgment: Judgment normal.   Ortho Exam well-healed oral tibial nail removal incisions interlocks left tibia.  Crepitus knee range of motion 10 to 15 degrees varus deformity negative logroll to hips.  Vital signs in last 24 hours: @VSRANGES @  Labs:   Estimated body mass index is 34.58 kg/m as calculated from the following:   Height as of 06/13/23: 5\' 8"  (1.727 m).   Weight as of 06/13/23: 227 lb 6.4 oz (103.1 kg).   Imaging Review Plain radiographs demonstrate severe degenerative joint disease of the left knee(s). The overall alignment isneutral. The bone quality appears to be excellent for age and reported activity level.      Assessment/Plan:  End stage arthritis, left knee   The patient history, physical examination, clinical judgment of the provider and imaging studies are consistent with end stage degenerative joint disease of the left knee(s) and total knee arthroplasty is deemed medically necessary. The treatment options including medical management, injection therapy arthroscopy and arthroplasty were discussed at length. The risks and benefits of total knee arthroplasty were presented and reviewed. The risks due to aseptic loosening, infection, stiffness, patella tracking problems, thromboembolic complications and other imponderables were discussed. The patient acknowledged the explanation, agreed to proceed with the plan and consent was signed. Patient is being admitted for  inpatient treatment for surgery, pain control, PT, OT, prophylactic antibiotics, VTE prophylaxis, progressive ambulation and ADL's and discharge planning. The patient is planning to be discharged home with home health services     Patient's anticipated LOS is less than 2 midnights, meeting these requirements: - Younger than 20 - Lives within 1 hour of care - Has a competent adult at home to recover with post-op recover - NO history of  - Chronic pain requiring opiods  - Diabetes  - Coronary Artery Disease  - Heart failure  - Heart attack  - Stroke  - DVT/VTE  - Cardiac arrhythmia  - Respiratory Failure/COPD  - Renal failure  - Anemia  - Advanced Liver disease

## 2023-06-28 NOTE — Progress Notes (Signed)
Left voicemail for patient to notify him that surgery is scheduled for tomorrow 06/29/23 at 0730. He needs to arrive at hospital at 0530 and finish his pre op G2 by 0430. Pt confirmed that he will check in at admitting at 0530 and finish his G2 at 0430.He knows he is to stop all liquids at 0430. He received pre op instructions at his pre op visit. Marland Kitchen

## 2023-06-29 ENCOUNTER — Encounter: Payer: Medicaid Other | Admitting: Orthopaedic Surgery

## 2023-06-29 ENCOUNTER — Telehealth: Payer: Self-pay

## 2023-06-29 ENCOUNTER — Ambulatory Visit (HOSPITAL_COMMUNITY): Admission: RE | Admit: 2023-06-29 | Payer: 59 | Source: Home / Self Care | Admitting: Orthopaedic Surgery

## 2023-06-29 SURGERY — ARTHROPLASTY, KNEE, TOTAL
Anesthesia: Spinal | Site: Knee | Laterality: Left

## 2023-06-29 NOTE — Telephone Encounter (Signed)
Per Dr. Ophelia Charter, pt was unable to arrange transportation this morning and did not have surgery.  I called patient and left a voice mail for him to call me back to discuss rescheduling.

## 2023-07-05 ENCOUNTER — Encounter (HOSPITAL_COMMUNITY): Payer: Self-pay | Admitting: Vascular Surgery

## 2023-07-05 ENCOUNTER — Encounter (HOSPITAL_COMMUNITY): Payer: Self-pay | Admitting: Orthopaedic Surgery

## 2023-07-05 NOTE — Progress Notes (Signed)
SDW call  Patient was given pre-op instructions over the phone. Patient verbalized understanding of instructions provided.     PCP - Deberah Castle, NP Cardiologist - Dr. Riley Lam Pulmonary:    PPM/ICD - denies Device Orders - n/a Rep Notified - n/a   Chest x-ray - n/a EKG -  02/09/2023 Stress Test -  ECHO - 01/12/22 Cardiac Cath - 11/26/2020   Sleep Study/sleep apnea/CPAP: Denies  Type II diabetic Fasting Blood sugar range: New diagnosis of diabetes, does not check sugars How often check sugars: does not check sugars Metformin, instructed to hold DOS Farxiga, states last dose was 06/23/2023   Blood Thinner Instructions: Plavix, states last dose 06/23/2023 Aspirin Instructions: denies   ERAS Protcol - Clear fluids until 0430 PRE-SURGERY Ensure or G2- Given G2 during PAT appointment   COVID TEST- n/a    Anesthesia review: Reviewed 06/13/2023   Patient denies shortness of breath, fever, cough and chest pain over the phone call  Your procedure is scheduled on Friday July 06, 2023  Report to [facility] Main Entrance "A" at 0530 A.M., then check in with the Admitting office.  Call this number if you have problems the morning of surgery:  [phone]   If you have any questions prior to your surgery date call [phone]: Open Monday-Friday 8am-4pm If you experience any cold or flu symptoms such as cough, fever, chills, shortness of breath, etc. between now and your scheduled surgery, please notify us at the above number     Remember:  Do not eat after midnight the night before your surgery  You may drink clear liquids until  0430   the morning of your surgery.   Clear liquids allowed are: Water, Non-Citrus Juices (without pulp), Carbonated Beverages, Clear Tea, Black Coffee ONLY (NO MILK, CREAM OR POWDERED CREAMER of any kind), and Gatorade   Take these medicines the morning of surgery with A SIP OF WATER:  Carvedilol, zyrtec, prilosec  As of today, STOP  taking any Aspirin (unless otherwise instructed by your surgeon) Aleve, Naproxen, Ibuprofen, Motrin, Advil, Goody's, BC's, all herbal medications, fish oil, and all vitamins.

## 2023-07-05 NOTE — Anesthesia Preprocedure Evaluation (Addendum)
Anesthesia Evaluation  Patient identified by MRN, date of birth, ID band Patient awake    Reviewed: Allergy & Precautions, NPO status , Patient's Chart, lab work & pertinent test results  Airway Mallampati: II  TM Distance: >3 FB Neck ROM: Full    Dental  (+) Dental Advisory Given, Lower Dentures, Upper Dentures   Pulmonary COPD, Current Smoker and Patient abstained from smoking.   Pulmonary exam normal breath sounds clear to auscultation       Cardiovascular hypertension, Pt. on home beta blockers and Pt. on medications + CAD and +CHF  Normal cardiovascular exam+ Valvular Problems/Murmurs MR  Rhythm:Regular Rate:Normal  Echo 01/12/22: 1. Improved LVEF - was 35-40% (08/2021), now 45-50%. MR from moderate  decreased now to mild. GLS -11.9. Left ventricular ejection fraction, by  estimation, is 45 to 50%. The left ventricle has mildly decreased  function. The left ventricle has no regional   wall motion abnormalities. Left ventricular diastolic parameters are  consistent with Grade II diastolic dysfunction (pseudonormalization).   2. Right ventricular systolic function is normal. The right ventricular  size is normal. There is normal pulmonary artery systolic pressure.   3. The mitral valve is normal in structure. Mild mitral valve  regurgitation. No evidence of mitral stenosis.   4. The aortic valve is normal in structure. Aortic valve regurgitation is  not visualized. No aortic stenosis is present.   5. The inferior vena cava is normal in size with greater than 50%  respiratory variability, suggesting right atrial pressure of 3 mmHg.      Neuro/Psych CVA, No Residual Symptoms    GI/Hepatic ,GERD  Medicated,,  Endo/Other  diabetes, Type 2, Oral Hypoglycemic Agents  Obesity   Renal/GU      Musculoskeletal  (+) Arthritis  (left knee osteoarthritis), Osteoarthritis,    Abdominal   Peds  Hematology  (+) Blood  dyscrasia (Plavix)   Anesthesia Other Findings   Reproductive/Obstetrics                             Anesthesia Physical Anesthesia Plan  ASA: 3  Anesthesia Plan: Spinal   Post-op Pain Management: Regional block* and Tylenol PO (pre-op)*   Induction: Intravenous  PONV Risk Score and Plan: 0 and TIVA, Treatment may vary due to age or medical condition, Midazolam, Dexamethasone and Ondansetron  Airway Management Planned: Natural Airway and Simple Face Mask  Additional Equipment:   Intra-op Plan:   Post-operative Plan:   Informed Consent: I have reviewed the patients History and Physical, chart, labs and discussed the procedure including the risks, benefits and alternatives for the proposed anesthesia with the patient or authorized representative who has indicated his/her understanding and acceptance.     Dental advisory given  Plan Discussed with: CRNA  Anesthesia Plan Comments: (PAT note written by Shonna Chock, PA-C. Case rescheduled from 06/29/23 due to transportation issues. He never resumed Plavix or Fargixa, last doses 06/23/23.  )       Anesthesia Quick Evaluation

## 2023-07-06 ENCOUNTER — Other Ambulatory Visit: Payer: Self-pay

## 2023-07-06 ENCOUNTER — Encounter (HOSPITAL_COMMUNITY)
Admission: RE | Disposition: A | Discharge status: Home / Self Care | Payer: Self-pay | Source: Home / Self Care | Attending: Orthopaedic Surgery

## 2023-07-06 ENCOUNTER — Observation Stay (HOSPITAL_BASED_OUTPATIENT_CLINIC_OR_DEPARTMENT_OTHER): Payer: 59 | Admitting: Vascular Surgery

## 2023-07-06 ENCOUNTER — Encounter (HOSPITAL_COMMUNITY): Payer: Self-pay | Admitting: Orthopaedic Surgery

## 2023-07-06 ENCOUNTER — Observation Stay (HOSPITAL_COMMUNITY)
Admission: RE | Admit: 2023-07-06 | Discharge: 2023-07-07 | Disposition: A | Discharge status: Home / Self Care | Payer: 59 | Attending: Orthopaedic Surgery | Admitting: Orthopaedic Surgery

## 2023-07-06 ENCOUNTER — Encounter: Payer: Medicaid Other | Admitting: Orthopaedic Surgery

## 2023-07-06 ENCOUNTER — Observation Stay (HOSPITAL_COMMUNITY): Payer: 59 | Admitting: Vascular Surgery

## 2023-07-06 DIAGNOSIS — Z7984 Long term (current) use of oral hypoglycemic drugs: Secondary | ICD-10-CM | POA: Insufficient documentation

## 2023-07-06 DIAGNOSIS — J449 Chronic obstructive pulmonary disease, unspecified: Secondary | ICD-10-CM | POA: Insufficient documentation

## 2023-07-06 DIAGNOSIS — Z7902 Long term (current) use of antithrombotics/antiplatelets: Secondary | ICD-10-CM | POA: Diagnosis not present

## 2023-07-06 DIAGNOSIS — I110 Hypertensive heart disease with heart failure: Secondary | ICD-10-CM | POA: Insufficient documentation

## 2023-07-06 DIAGNOSIS — M1712 Unilateral primary osteoarthritis, left knee: Secondary | ICD-10-CM

## 2023-07-06 DIAGNOSIS — Z96652 Presence of left artificial knee joint: Secondary | ICD-10-CM

## 2023-07-06 DIAGNOSIS — Z8673 Personal history of transient ischemic attack (TIA), and cerebral infarction without residual deficits: Secondary | ICD-10-CM | POA: Diagnosis not present

## 2023-07-06 DIAGNOSIS — I5021 Acute systolic (congestive) heart failure: Secondary | ICD-10-CM | POA: Diagnosis not present

## 2023-07-06 DIAGNOSIS — I2510 Atherosclerotic heart disease of native coronary artery without angina pectoris: Secondary | ICD-10-CM

## 2023-07-06 DIAGNOSIS — E119 Type 2 diabetes mellitus without complications: Secondary | ICD-10-CM | POA: Insufficient documentation

## 2023-07-06 DIAGNOSIS — Z79899 Other long term (current) drug therapy: Secondary | ICD-10-CM | POA: Diagnosis not present

## 2023-07-06 DIAGNOSIS — M179 Osteoarthritis of knee, unspecified: Principal | ICD-10-CM | POA: Diagnosis present

## 2023-07-06 DIAGNOSIS — F17210 Nicotine dependence, cigarettes, uncomplicated: Secondary | ICD-10-CM | POA: Diagnosis not present

## 2023-07-06 HISTORY — PX: TOTAL KNEE ARTHROPLASTY: SHX125

## 2023-07-06 LAB — GLUCOSE, CAPILLARY
Glucose-Capillary: 109 mg/dL — ABNORMAL HIGH (ref 70–99)
Glucose-Capillary: 137 mg/dL — ABNORMAL HIGH (ref 70–99)

## 2023-07-06 SURGERY — ARTHROPLASTY, KNEE, TOTAL
Anesthesia: Spinal | Site: Knee | Laterality: Left

## 2023-07-06 MED ORDER — CLOPIDOGREL BISULFATE 75 MG PO TABS
75.0000 mg | ORAL_TABLET | Freq: Every day | ORAL | Status: DC
  Administered 2023-07-07: 75 mg via ORAL
  Filled 2023-07-06: qty 1

## 2023-07-06 MED ORDER — DOCUSATE SODIUM 100 MG PO CAPS
100.0000 mg | ORAL_CAPSULE | Freq: Two times a day (BID) | ORAL | Status: DC
  Administered 2023-07-06 – 2023-07-07 (×2): 100 mg via ORAL
  Filled 2023-07-06 (×2): qty 1

## 2023-07-06 MED ORDER — BUPIVACAINE LIPOSOME 1.3 % IJ SUSP
INTRAMUSCULAR | Status: DC | PRN
  Administered 2023-07-06: 20 mL

## 2023-07-06 MED ORDER — VITAMIN D 25 MCG (1000 UNIT) PO TABS
1000.0000 [IU] | ORAL_TABLET | Freq: Every day | ORAL | Status: DC
  Administered 2023-07-07: 1000 [IU] via ORAL
  Filled 2023-07-06: qty 1

## 2023-07-06 MED ORDER — SODIUM CHLORIDE 0.9 % IR SOLN
Status: DC | PRN
  Administered 2023-07-06: 900 mL
  Administered 2023-07-06: 1000 mL

## 2023-07-06 MED ORDER — DIPHENHYDRAMINE HCL 12.5 MG/5ML PO ELIX: 12.5000 mg | ORAL_SOLUTION | ORAL | Status: DC | PRN

## 2023-07-06 MED ORDER — FENTANYL CITRATE (PF) 100 MCG/2ML IJ SOLN: 25.0000 ug | INTRAMUSCULAR | Status: DC | PRN

## 2023-07-06 MED ORDER — ACETAMINOPHEN 325 MG PO TABS: 325.0000 mg | ORAL_TABLET | Freq: Four times a day (QID) | ORAL | Status: DC | PRN

## 2023-07-06 MED ORDER — FENTANYL CITRATE (PF) 100 MCG/2ML IJ SOLN: 50.0000 ug | Freq: Once | INTRAMUSCULAR | Status: AC

## 2023-07-06 MED ORDER — POLYETHYLENE GLYCOL 3350 17 G PO PACK: 17.0000 g | PACK | Freq: Every day | ORAL | Status: DC | PRN

## 2023-07-06 MED ORDER — PHENYLEPHRINE HCL-NACL 20-0.9 MG/250ML-% IV SOLN
INTRAVENOUS | Status: DC | PRN
  Administered 2023-07-06: 30 ug/min via INTRAVENOUS

## 2023-07-06 MED ORDER — PROPOFOL 10 MG/ML IV BOLUS
INTRAVENOUS | Status: AC
  Filled 2023-07-06: qty 20

## 2023-07-06 MED ORDER — ORAL CARE MOUTH RINSE: 15.0000 mL | Freq: Once | OROMUCOSAL | Status: AC

## 2023-07-06 MED ORDER — FUROSEMIDE 40 MG PO TABS
160.0000 mg | ORAL_TABLET | Freq: Every day | ORAL | Status: DC
  Administered 2023-07-07: 160 mg via ORAL
  Filled 2023-07-06: qty 4

## 2023-07-06 MED ORDER — HYDROMORPHONE HCL 1 MG/ML IJ SOLN
0.5000 mg | INTRAMUSCULAR | Status: DC | PRN
  Administered 2023-07-06: 1 mg via INTRAVENOUS
  Administered 2023-07-07: 0.5 mg via INTRAVENOUS
  Filled 2023-07-06 (×2): qty 1

## 2023-07-06 MED ORDER — NAPROXEN 250 MG PO TABS
250.0000 mg | ORAL_TABLET | Freq: Two times a day (BID) | ORAL | Status: DC
  Administered 2023-07-06 – 2023-07-07 (×2): 250 mg via ORAL
  Filled 2023-07-06 (×2): qty 1

## 2023-07-06 MED ORDER — LACTATED RINGERS IV SOLN: INTRAVENOUS | Status: DC

## 2023-07-06 MED ORDER — CARVEDILOL 12.5 MG PO TABS
12.5000 mg | ORAL_TABLET | Freq: Two times a day (BID) | ORAL | Status: DC
  Administered 2023-07-06 – 2023-07-07 (×2): 12.5 mg via ORAL
  Filled 2023-07-06 (×2): qty 1

## 2023-07-06 MED ORDER — OXYCODONE HCL 5 MG PO TABS
5.0000 mg | ORAL_TABLET | ORAL | Status: DC | PRN
  Filled 2023-07-06: qty 2

## 2023-07-06 MED ORDER — PROPOFOL 500 MG/50ML IV EMUL
INTRAVENOUS | Status: DC | PRN
  Administered 2023-07-06: 75 ug/kg/min via INTRAVENOUS

## 2023-07-06 MED ORDER — METOCLOPRAMIDE HCL 5 MG/ML IJ SOLN: 5.0000 mg | Freq: Three times a day (TID) | INTRAMUSCULAR | Status: DC | PRN

## 2023-07-06 MED ORDER — CHLORHEXIDINE GLUCONATE 0.12 % MT SOLN
15.0000 mL | Freq: Once | OROMUCOSAL | Status: AC
  Administered 2023-07-06: 15 mL via OROMUCOSAL
  Filled 2023-07-06: qty 15

## 2023-07-06 MED ORDER — ONDANSETRON HCL 4 MG PO TABS
4.0000 mg | ORAL_TABLET | Freq: Four times a day (QID) | ORAL | Status: DC | PRN
  Administered 2023-07-06: 4 mg via ORAL
  Filled 2023-07-06: qty 1

## 2023-07-06 MED ORDER — AMISULPRIDE (ANTIEMETIC) 5 MG/2ML IV SOLN: 10.0000 mg | Freq: Once | INTRAVENOUS | Status: DC | PRN

## 2023-07-06 MED ORDER — DAPAGLIFLOZIN PROPANEDIOL 10 MG PO TABS
10.0000 mg | ORAL_TABLET | Freq: Every day | ORAL | Status: DC
  Administered 2023-07-07: 10 mg via ORAL
  Filled 2023-07-06: qty 1

## 2023-07-06 MED ORDER — PROPOFOL 10 MG/ML IV BOLUS
INTRAVENOUS | Status: DC | PRN
  Administered 2023-07-06: 20 mg via INTRAVENOUS
  Administered 2023-07-06: 50 mg via INTRAVENOUS
  Administered 2023-07-06 (×2): 20 mg via INTRAVENOUS

## 2023-07-06 MED ORDER — ONDANSETRON HCL 4 MG/2ML IJ SOLN: 4.0000 mg | Freq: Four times a day (QID) | INTRAMUSCULAR | Status: DC | PRN

## 2023-07-06 MED ORDER — POTASSIUM CHLORIDE CRYS ER 10 MEQ PO TBCR
10.0000 meq | EXTENDED_RELEASE_TABLET | Freq: Every day | ORAL | Status: DC
  Administered 2023-07-06 – 2023-07-07 (×2): 10 meq via ORAL
  Filled 2023-07-06 (×2): qty 1

## 2023-07-06 MED ORDER — TRANEXAMIC ACID-NACL 1000-0.7 MG/100ML-% IV SOLN
1000.0000 mg | INTRAVENOUS | Status: AC
  Administered 2023-07-06: 1000 mg via INTRAVENOUS
  Filled 2023-07-06: qty 100

## 2023-07-06 MED ORDER — ASPIRIN 325 MG PO TBEC
325.0000 mg | DELAYED_RELEASE_TABLET | Freq: Every day | ORAL | Status: DC
  Administered 2023-07-07: 325 mg via ORAL
  Filled 2023-07-06: qty 1

## 2023-07-06 MED ORDER — PANTOPRAZOLE SODIUM 40 MG PO TBEC
40.0000 mg | DELAYED_RELEASE_TABLET | Freq: Every day | ORAL | Status: DC
  Administered 2023-07-06 – 2023-07-07 (×2): 40 mg via ORAL
  Filled 2023-07-06 (×2): qty 1

## 2023-07-06 MED ORDER — PHENOL 1.4 % MT LIQD: 1.0000 | OROMUCOSAL | Status: DC | PRN

## 2023-07-06 MED ORDER — ONDANSETRON HCL 4 MG/2ML IJ SOLN: 4.0000 mg | Freq: Once | INTRAMUSCULAR | Status: DC | PRN

## 2023-07-06 MED ORDER — LORATADINE 10 MG PO TABS
10.0000 mg | ORAL_TABLET | Freq: Every day | ORAL | Status: DC
  Administered 2023-07-07: 10 mg via ORAL
  Filled 2023-07-06: qty 1

## 2023-07-06 MED ORDER — MENTHOL 3 MG MT LOZG: 1.0000 | LOZENGE | OROMUCOSAL | Status: DC | PRN

## 2023-07-06 MED ORDER — MIDAZOLAM HCL 2 MG/2ML IJ SOLN: 2.0000 mg | Freq: Once | INTRAMUSCULAR | Status: AC

## 2023-07-06 MED ORDER — CARVEDILOL 12.5 MG PO TABS
12.5000 mg | ORAL_TABLET | Freq: Once | ORAL | Status: AC
  Administered 2023-07-06: 12.5 mg via ORAL
  Filled 2023-07-06: qty 1

## 2023-07-06 MED ORDER — METOCLOPRAMIDE HCL 5 MG PO TABS: 5.0000 mg | ORAL_TABLET | Freq: Three times a day (TID) | ORAL | Status: DC | PRN

## 2023-07-06 MED ORDER — OXYCODONE HCL 5 MG PO TABS
10.0000 mg | ORAL_TABLET | ORAL | Status: DC | PRN
  Administered 2023-07-06: 10 mg via ORAL

## 2023-07-06 MED ORDER — BUPIVACAINE HCL (PF) 0.25 % IJ SOLN
INTRAMUSCULAR | Status: DC | PRN
  Administered 2023-07-06: 20 mL

## 2023-07-06 MED ORDER — ONDANSETRON HCL 4 MG/2ML IJ SOLN
INTRAMUSCULAR | Status: DC | PRN
  Administered 2023-07-06: 4 mg via INTRAVENOUS

## 2023-07-06 MED ORDER — GABAPENTIN 300 MG PO CAPS
300.0000 mg | ORAL_CAPSULE | Freq: Every day | ORAL | Status: DC
  Administered 2023-07-06: 300 mg via ORAL
  Filled 2023-07-06: qty 1

## 2023-07-06 MED ORDER — SODIUM CHLORIDE 0.9 % IV SOLN: INTRAVENOUS | Status: DC

## 2023-07-06 MED ORDER — FENTANYL CITRATE (PF) 100 MCG/2ML IJ SOLN
INTRAMUSCULAR | Status: AC
  Administered 2023-07-06: 50 ug via INTRAVENOUS
  Filled 2023-07-06: qty 2

## 2023-07-06 MED ORDER — BISACODYL 5 MG PO TBEC: 5.0000 mg | DELAYED_RELEASE_TABLET | Freq: Every day | ORAL | Status: DC | PRN

## 2023-07-06 MED ORDER — SPIRONOLACTONE 12.5 MG HALF TABLET
12.5000 mg | ORAL_TABLET | Freq: Every day | ORAL | Status: DC
  Administered 2023-07-06: 12.5 mg via ORAL
  Filled 2023-07-06: qty 1

## 2023-07-06 MED ORDER — MIDAZOLAM HCL 2 MG/2ML IJ SOLN
INTRAMUSCULAR | Status: AC
  Administered 2023-07-06: 2 mg via INTRAVENOUS
  Filled 2023-07-06: qty 2

## 2023-07-06 MED ORDER — PHENYLEPHRINE 80 MCG/ML (10ML) SYRINGE FOR IV PUSH (FOR BLOOD PRESSURE SUPPORT)
PREFILLED_SYRINGE | INTRAVENOUS | Status: DC | PRN
Start: 2023-07-06 — End: 2023-07-06
  Administered 2023-07-06 (×2): 80 ug via INTRAVENOUS

## 2023-07-06 MED ORDER — ACETAMINOPHEN 500 MG PO TABS
1000.0000 mg | ORAL_TABLET | Freq: Once | ORAL | Status: AC
  Administered 2023-07-06: 1000 mg via ORAL
  Filled 2023-07-06: qty 2

## 2023-07-06 MED ORDER — METFORMIN HCL 500 MG PO TABS
500.0000 mg | ORAL_TABLET | Freq: Two times a day (BID) | ORAL | Status: DC
  Administered 2023-07-06 – 2023-07-07 (×2): 500 mg via ORAL
  Filled 2023-07-06 (×2): qty 1

## 2023-07-06 MED ORDER — CEFAZOLIN SODIUM-DEXTROSE 2-4 GM/100ML-% IV SOLN
2.0000 g | INTRAVENOUS | Status: AC
  Administered 2023-07-06: 2 g via INTRAVENOUS
  Filled 2023-07-06: qty 100

## 2023-07-06 MED ORDER — ATORVASTATIN CALCIUM 80 MG PO TABS
80.0000 mg | ORAL_TABLET | Freq: Every day | ORAL | Status: DC
  Administered 2023-07-06: 80 mg via ORAL
  Filled 2023-07-06: qty 1

## 2023-07-06 MED ORDER — BUPIVACAINE HCL (PF) 0.25 % IJ SOLN
INTRAMUSCULAR | Status: AC
  Filled 2023-07-06: qty 30

## 2023-07-06 SURGICAL SUPPLY — 83 items
ATTUNE MED DOME PAT 38 KNEE (Knees) IMPLANT
ATTUNE PS FEM LT SZ 7 CEM KNEE (Femur) IMPLANT
ATTUNE PSRP INSR SZ7 5 KNEE (Insert) IMPLANT
BAG COUNTER SPONGE SURGICOUNT (BAG) ×1 IMPLANT
BAG SPNG CNTER NS LX DISP (BAG) ×1
BANDAGE ESMARK 6X9 LF (GAUZE/BANDAGES/DRESSINGS) ×1 IMPLANT
BASE TIBIA ATTUNE KNEE SYS SZ6 (Knees) IMPLANT
BLADE SAGITTAL 25.0X1.19X90 (BLADE) ×1 IMPLANT
BLADE SAW SGTL 13X75X1.27 (BLADE) ×1 IMPLANT
BNDG CMPR 9X6 STRL LF SNTH (GAUZE/BANDAGES/DRESSINGS) ×1
BNDG CMPR MED 10X6 ELC LF (GAUZE/BANDAGES/DRESSINGS) ×1
BNDG CMPR STD VLCR NS LF 5.8X4 (GAUZE/BANDAGES/DRESSINGS) ×1
BNDG ELASTIC 4X5.8 VLCR NS LF (GAUZE/BANDAGES/DRESSINGS) ×1 IMPLANT
BNDG ELASTIC 4X5.8 VLCR STR LF (GAUZE/BANDAGES/DRESSINGS) ×1 IMPLANT
BNDG ELASTIC 6X10 VLCR STRL LF (GAUZE/BANDAGES/DRESSINGS) ×1 IMPLANT
BNDG ESMARK 6X9 LF (GAUZE/BANDAGES/DRESSINGS) ×1
BNDG GAUZE DERMACEA FLUFF 4 (GAUZE/BANDAGES/DRESSINGS) IMPLANT
BNDG GZE DERMACEA 4 6PLY (GAUZE/BANDAGES/DRESSINGS) ×1
BOWL SMART MIX CTS (DISPOSABLE) ×1 IMPLANT
BSPLAT TIB 6 CMNT ROT PLAT STR (Knees) ×1 IMPLANT
CEMENT HV SMART SET (Cement) ×2 IMPLANT
COOLER ICEMAN CLASSIC (MISCELLANEOUS) IMPLANT
COVER SURGICAL LIGHT HANDLE (MISCELLANEOUS) ×1 IMPLANT
CUFF TOURN SGL QUICK 34 (TOURNIQUET CUFF) ×1
CUFF TOURN SGL QUICK 42 (TOURNIQUET CUFF) IMPLANT
CUFF TRNQT CYL 34X4.125X (TOURNIQUET CUFF) ×1 IMPLANT
DRAPE EXTREMITY T 121X128X90 (DISPOSABLE) IMPLANT
DRAPE INCISE IOBAN 66X45 STRL (DRAPES) IMPLANT
DRAPE ORTHO SPLIT 77X108 STRL (DRAPES) ×3
DRAPE SURG ORHT 6 SPLT 77X108 (DRAPES) ×2 IMPLANT
DRAPE U-SHAPE 47X51 STRL (DRAPES) ×1 IMPLANT
DRSG AQUACEL AG ADV 3.5X14 (GAUZE/BANDAGES/DRESSINGS) IMPLANT
DURAPREP 26ML APPLICATOR (WOUND CARE) ×2 IMPLANT
ELECT REM PT RETURN 9FT ADLT (ELECTROSURGICAL) ×1
ELECTRODE REM PT RTRN 9FT ADLT (ELECTROSURGICAL) ×1 IMPLANT
FACESHIELD WRAPAROUND (MASK) ×2
FACESHIELD WRAPAROUND OR TEAM (MASK) ×2 IMPLANT
GAUZE PAD ABD 8X10 STRL (GAUZE/BANDAGES/DRESSINGS) ×1 IMPLANT
GAUZE XEROFORM 5X9 LF (GAUZE/BANDAGES/DRESSINGS) ×1 IMPLANT
GLOVE BIO SURGEON STRL SZ7.5 (GLOVE) IMPLANT
GLOVE BIOGEL PI IND STRL 6 (GLOVE) IMPLANT
GLOVE BIOGEL PI IND STRL 7.0 (GLOVE) IMPLANT
GLOVE BIOGEL PI IND STRL 7.5 (GLOVE) IMPLANT
GLOVE BIOGEL PI IND STRL 8 (GLOVE) ×2 IMPLANT
GLOVE BIOGEL PI MICRO STRL 6 (GLOVE) IMPLANT
GLOVE INDICATOR 8.0 STRL GRN (GLOVE) IMPLANT
GLOVE ORTHO TXT STRL SZ7.5 (GLOVE) ×2 IMPLANT
GOWN STRL REUS W/ TWL LRG LVL3 (GOWN DISPOSABLE) ×1 IMPLANT
GOWN STRL REUS W/ TWL XL LVL3 (GOWN DISPOSABLE) ×1 IMPLANT
GOWN STRL REUS W/TWL 2XL LVL3 (GOWN DISPOSABLE) ×1 IMPLANT
GOWN STRL REUS W/TWL LRG LVL3 (GOWN DISPOSABLE) ×2
GOWN STRL REUS W/TWL XL LVL3 (GOWN DISPOSABLE) ×1
GOWN STRL SURGICAL XL XLNG (GOWN DISPOSABLE) IMPLANT
HANDPIECE INTERPULSE COAX TIP (DISPOSABLE) ×1
IMMOBILIZER KNEE 22 UNIV (SOFTGOODS) ×1 IMPLANT
KIT BASIN OR (CUSTOM PROCEDURE TRAY) ×1 IMPLANT
KIT TURNOVER KIT B (KITS) ×1 IMPLANT
MANIFOLD NEPTUNE II (INSTRUMENTS) ×1 IMPLANT
MARKER SKIN DUAL TIP RULER LAB (MISCELLANEOUS) ×1 IMPLANT
NDL 18GX1X1/2 (RX/OR ONLY) (NEEDLE) ×1 IMPLANT
NDL HYPO 25GX1X1/2 BEV (NEEDLE) ×1 IMPLANT
NEEDLE 18GX1X1/2 (RX/OR ONLY) (NEEDLE) ×1
NEEDLE HYPO 25GX1X1/2 BEV (NEEDLE) ×1
NS IRRIG 1000ML POUR BTL (IV SOLUTION) ×1 IMPLANT
PACK TOTAL JOINT (CUSTOM PROCEDURE TRAY) ×1 IMPLANT
PAD ARMBOARD 7.5X6 YLW CONV (MISCELLANEOUS) ×2 IMPLANT
PADDING CAST COTTON 6X4 STRL (CAST SUPPLIES) ×1 IMPLANT
SET HNDPC FAN SPRY TIP SCT (DISPOSABLE) ×1 IMPLANT
STAPLER VISISTAT 35W (STAPLE) IMPLANT
SUCTION TUBE FRAZIER 10FR DISP (SUCTIONS) ×1 IMPLANT
SUT VIC AB 0 CT1 27 (SUTURE) ×1
SUT VIC AB 0 CT1 27XBRD ANBCTR (SUTURE) ×1 IMPLANT
SUT VIC AB 1 CTX 36 (SUTURE) ×2
SUT VIC AB 1 CTX36XBRD ANBCTR (SUTURE) ×2 IMPLANT
SUT VIC AB 2-0 CT1 27 (SUTURE) ×2
SUT VIC AB 2-0 CT1 TAPERPNT 27 (SUTURE) ×2 IMPLANT
SYR 50ML LL SCALE MARK (SYRINGE) ×1 IMPLANT
SYR CONTROL 10ML LL (SYRINGE) ×1 IMPLANT
TIBIA ATTUNE KNEE SYS BASE SZ6 (Knees) ×1 IMPLANT
TOWEL GREEN STERILE (TOWEL DISPOSABLE) ×1 IMPLANT
TOWEL GREEN STERILE FF (TOWEL DISPOSABLE) ×1 IMPLANT
TRAY CATH INTERMITTENT SS 16FR (CATHETERS) IMPLANT
WATER STERILE IRR 1000ML POUR (IV SOLUTION) IMPLANT

## 2023-07-06 NOTE — Progress Notes (Signed)
Patient transferred from PACU at 13 40 PM via bed. Alert and oriented. On RA. No bleeding from surgical site. Will continue to monitor.

## 2023-07-06 NOTE — Anesthesia Procedure Notes (Signed)
Procedure Name: MAC Date/Time: 07/06/2023 8:51 AM  Performed by: Randon Goldsmith, CRNAPre-anesthesia Checklist: Patient identified, Emergency Drugs available, Suction available and Patient being monitored Patient Re-evaluated:Patient Re-evaluated prior to induction Oxygen Delivery Method: Simple face mask Preoxygenation: Pre-oxygenation with 100% oxygen

## 2023-07-06 NOTE — Care Management Obs Status (Cosign Needed)
MEDICARE OBSERVATION STATUS NOTIFICATION   Patient Details  Name: Cody Carney MRN: [id number] Date of Birth: April 04, 1961   Medicare Observation Status Notification Given:  Yes    Janae Bridgeman, RN 07/06/2023, 4:37 PM

## 2023-07-06 NOTE — Progress Notes (Signed)
Incentive spirometry introduced with teach back.  SCD placed to R/lower extremity per order.  Patient is connected to continuous pulse oxy meter. No sign of bleeding from surgical site. Will continue to monitor.

## 2023-07-06 NOTE — Anesthesia Procedure Notes (Signed)
Spinal  Patient location during procedure: OR Start time: 07/06/2023 8:31 AM End time: 07/06/2023 8:34 AM Reason for block: surgical anesthesia Staffing Performed: anesthesiologist  Anesthesiologist: Collene Schlichter, MD Performed by: Collene Schlichter, MD Authorized by: Collene Schlichter, MD   Preanesthetic Checklist Completed: patient identified, IV checked, risks and benefits discussed, surgical consent, monitors and equipment checked, pre-op evaluation and timeout performed Spinal Block Patient position: sitting Prep: DuraPrep and site prepped and draped Patient monitoring: continuous pulse ox and blood pressure Approach: midline Location: L3-4 Injection technique: single-shot Needle Needle type: Pencan  Needle gauge: 24 G Assessment Events: CSF return Additional Notes Functioning IV was confirmed and monitors were applied. Sterile prep and drape, including hand hygiene, mask and sterile gloves were used. The patient was positioned and the spine was prepped. The skin was anesthetized with lidocaine.  Free flow of clear CSF was obtained prior to injecting local anesthetic into the CSF.  The spinal needle aspirated freely following injection.  The needle was carefully withdrawn.  The patient tolerated the procedure well. Consent was obtained prior to procedure with all questions answered and concerns addressed. Risks including but not limited to bleeding, infection, nerve damage, paralysis, failed block, inadequate analgesia, allergic reaction, high spinal, itching and headache were discussed and the patient wished to proceed.   Arrie Aran, MD

## 2023-07-06 NOTE — Interval H&P Note (Signed)
History and Physical Interval Note:  07/06/2023 7:21 AM  Cody Carney  has presented today for surgery, with the diagnosis of left knee osteoarthritis.  The various methods of treatment have been discussed with the patient and family. After consideration of risks, benefits and other options for treatment, the patient has consented to  Procedure(s) with comments: LEFT TOTAL KNEE ARTHROPLASTY (Left) - Needs RNFA as a surgical intervention.  The patient's history has been reviewed, patient examined, no change in status, stable for surgery.  I have reviewed the patient's chart and labs.  Questions were answered to the patient's satisfaction.     [name]

## 2023-07-06 NOTE — Evaluation (Signed)
Physical Therapy Evaluation Patient Details Name: Cody Carney MRN: [id number] DOB: July 03, 1961 Today's Date: 07/06/2023  History of Present Illness  Pt is a 62 y.o. male who presented 07/06/23 for elective L TKA. PMH: CHF, aortic atherosclerosis, arthritis, carpal tunnel syndrome, COPD, DM, HTN, GERD, HLD, CVA, tobacco abuse   Clinical Impression  Pt presents with condition above and deficits mentioned below, see PT Problem List. PTA, he was independent without DME. He has a friend staying with him however long he needs to assist him and his daughter plans to stay and assist the initial few days upon d/c. Pt resides in a 2-level house with 1 STE. He does not need to go upstairs. Currently, pt is demonstrating great L knee extension/flexion AROM considering just having L TKA today. Per visual estimation, he is able to extend his knee fully and flex his knee to ~100' degrees while supine. His primary limitation is his lack of bil lower extremity sensation (likely from meds during surgery). This appears to be impacting his balance, resulting in him needing minA to gain his balance with transfers and min-modA to maintain and regain his balance with LOB bouts when ambulating with a RW. Pt will likely progress well as his sensation returns. Will continue to follow acutely.      If plan is discharge home, recommend the following: A lot of help with walking and/or transfers;A little help with bathing/dressing/bathroom;Assistance with cooking/housework;Assist for transportation;Help with stairs or ramp for entrance   Can travel by private vehicle        Equipment Recommendations Rolling walker (2 wheels);BSC/3in1  Recommendations for Other Services       Functional Status Assessment Patient has had a recent decline in their functional status and demonstrates the ability to make significant improvements in function in a reasonable and predictable amount of time.     Precautions / Restrictions  Precautions Precautions: Fall Required Braces or Orthoses: Knee Immobilizer - Left Restrictions Weight Bearing Restrictions: Yes LLE Weight Bearing: Weight bearing as tolerated      Mobility  Bed Mobility Overal bed mobility: Needs Assistance Bed Mobility: Supine to Sit     Supine to sit: Supervision, HOB elevated     General bed mobility comments: Supervision for safety    Transfers Overall transfer level: Needs assistance Equipment used: Rolling walker (2 wheels) Transfers: Sit to/from Stand Sit to Stand: Min assist           General transfer comment: Pt needed minA for balance as pt initially would lose balance transferring to stand, reportedly due to bil leg numbness following surgery today. Cues provided for hand placement, L KI donned    Ambulation/Gait Ambulation/Gait assistance: Min assist, Mod assist Gait Distance (Feet): 75 Feet Assistive device: Rolling walker (2 wheels) Gait Pattern/deviations: Step-to pattern, Decreased stance time - left, Decreased stride length, Decreased weight shift to left, Trunk flexed, Narrow base of support, Antalgic Gait velocity: reduced Gait velocity interpretation: <1.31 ft/sec, indicative of household ambulator   General Gait Details: KI donned. Pt ambulates with a slow, antalgic gait pattern and narrow BOS. Cues provided to widen BOS, look anteriorly, improve posture, and for positioning within RW, mod success noted. Pt displays moments of LOB in which he needs min-modA to recover, reportedly due to bil leg numbness following surgery today  Stairs            Wheelchair Mobility     Tilt Bed    Modified Rankin (Stroke Patients Only)  Balance Overall balance assessment: Needs assistance Sitting-balance support: No upper extremity supported, Feet supported Sitting balance-Leahy Scale: Good Sitting balance - Comments: Reaches off BOS to donn pants and socks, no LOB   Standing balance support: Bilateral  upper extremity supported, During functional activity, Reliant on assistive device for balance Standing balance-Leahy Scale: Poor Standing balance comment: Reliant on RW and min-modA                             Pertinent Vitals/Pain Pain Assessment Pain Assessment: Faces Faces Pain Scale: Hurts a little bit Pain Location: L knee Pain Descriptors / Indicators: Discomfort, Grimacing, Operative site guarding Pain Intervention(s): Limited activity within patient's tolerance, Monitored during session, Repositioned    Home Living Family/patient expects to be discharged to:: Private residence Living Arrangements: Non-relatives/Friends (initially will stay with him until no longer needs the assistance) Available Help at Discharge: Family;Friend(s);Available 24 hours/day Type of Home: House Home Access: Stairs to enter Entrance Stairs-Rails: None Entrance Stairs-Number of Steps: 1   Home Layout: Two level;Able to live on main level with bedroom/bathroom Home Equipment: Rollator (4 wheels)      Prior Function Prior Level of Function : Independent/Modified Independent;Driving             Mobility Comments: No AD ADLs Comments: does not work, on disability     Extremity/Trunk Assessment   Upper Extremity Assessment Upper Extremity Assessment: RUE deficits/detail;LUE deficits/detail RUE Deficits / Details: hx of carpal tunnel syndrome impacting sensation in hands; MMT scores of 5 grossly bil RUE Sensation: decreased light touch LUE Deficits / Details: hx of carpal tunnel syndrome impacting sensation in hands; MMT scores of 5 grossly bil LUE Sensation: decreased light touch    Lower Extremity Assessment Lower Extremity Assessment: RLE deficits/detail;LLE deficits/detail RLE Deficits / Details: reportedly symmetrical bil lower extremity numbness, likely due to meds from surgery; WFL strength and ROM RLE Sensation: decreased light touch LLE Deficits / Details:  reportedly symmetrical bil lower extremity numbness, likely due to meds from surgery; able to lift leg against gravity, MMT scores grossly >/= 4; able to achieve ~100 degrees knee flexion AROM supine and 0' knee extension AROM supine, visually estimated both measurements LLE Sensation: decreased light touch    Cervical / Trunk Assessment Cervical / Trunk Assessment: Normal  Communication   Communication Communication: No apparent difficulties  Cognition Arousal: Alert Behavior During Therapy: WFL for tasks assessed/performed Overall Cognitive Status: Within Functional Limits for tasks assessed                                          General Comments General comments (skin integrity, edema, etc.): provided HEP handout and educated pt on frequent compliance with exercises and frequent mobility; educated pt to elevate and ice leg to manage edema; educated pt on his risk for falls and recs for assistance and RW for all standing mobility at this time; he verbalized understanding    Exercises Total Joint Exercises Ankle Circles/Pumps: AROM, Left, 5 reps, Supine Quad Sets: AROM, Left, 5 reps, Supine Heel Slides: AROM, Left, 5 reps, Supine Straight Leg Raises: AROM, Left, 10 reps, Supine   Assessment/Plan    PT Assessment Patient needs continued PT services  PT Problem List Decreased strength;Decreased activity tolerance;Decreased range of motion;Decreased balance;Decreased mobility;Decreased knowledge of use of DME;Impaired sensation;Pain  PT Treatment Interventions DME instruction;Gait training;Stair training;Functional mobility training;Therapeutic activities;Therapeutic exercise;Balance training;Neuromuscular re-education;Patient/family education    PT Goals (Current goals can be found in the Care Plan section)  Acute Rehab PT Goals Patient Stated Goal: to improve PT Goal Formulation: With patient Time For Goal Achievement: 07/13/23 Potential to Achieve  Goals: Good    Frequency 7X/week     Co-evaluation               AM-PAC PT "6 Clicks" Mobility  Outcome Measure Help needed turning from your back to your side while in a flat bed without using bedrails?: A Little Help needed moving from lying on your back to sitting on the side of a flat bed without using bedrails?: A Little Help needed moving to and from a bed to a chair (including a wheelchair)?: A Little Help needed standing up from a chair using your arms (e.g., wheelchair or bedside chair)?: A Little Help needed to walk in hospital room?: A Lot Help needed climbing 3-5 steps with a railing? : A Lot 6 Click Score: 16    End of Session Equipment Utilized During Treatment: Gait belt;Left knee immobilizer Activity Tolerance: Patient tolerated treatment well Patient left: in chair;with call bell/phone within reach;with chair alarm set Nurse Communication: Mobility status;Patient requests pain meds PT Visit Diagnosis: Unsteadiness on feet (R26.81);Other abnormalities of gait and mobility (R26.89);Muscle weakness (generalized) (M62.81);Difficulty in walking, not elsewhere classified (R26.2);Pain Pain - Right/Left: Left Pain - part of body: Knee    Time: 1445-1526 PT Time Calculation (min) (ACUTE ONLY): 41 min   Charges:   PT Evaluation $PT Eval Moderate Complexity: 1 Mod PT Treatments $Gait Training: 8-22 mins $Therapeutic Activity: 8-22 mins PT General Charges $$ ACUTE PT VISIT: 1 Visit         Virgil Benedict, PT, DPT Acute Rehabilitation Services  Office: [phone]   Bettina Gavia 07/06/2023, 3:41 PM

## 2023-07-06 NOTE — Transfer of Care (Signed)
Immediate Anesthesia Transfer of Care Note  Patient: Cody Carney  Procedure(s) Performed: LEFT TOTAL KNEE ARTHROPLASTY (Left: Knee)  Patient Location: PACU  Anesthesia Type:Spinal and MAC combined with regional for post-op pain  Level of Consciousness: awake, alert , and oriented  Airway & Oxygen Therapy: Patient Spontanous Breathing  Post-op Assessment: Report given to RN and Post -op Vital signs reviewed and stable  Post vital signs: Reviewed and stable  Last Vitals:  Vitals Value Taken Time  BP 107/50 07/06/23 1115  Temp    Pulse 72 07/06/23 1115  Resp 28 07/06/23 1115  SpO2 96 % 07/06/23 1115  Vitals shown include unfiled device data.  Last Pain:  Vitals:   07/06/23 0820  TempSrc:   PainSc: Asleep         Complications: No notable events documented.

## 2023-07-06 NOTE — Anesthesia Postprocedure Evaluation (Signed)
Anesthesia Post Note  Patient: Cody Carney  Procedure(s) Performed: LEFT TOTAL KNEE ARTHROPLASTY (Left: Knee)     Patient location during evaluation: PACU Anesthesia Type: Spinal Level of consciousness: awake, awake and alert and oriented Pain management: pain level controlled Vital Signs Assessment: post-procedure vital signs reviewed and stable Respiratory status: spontaneous breathing, nonlabored ventilation and respiratory function stable Cardiovascular status: blood pressure returned to baseline and stable Postop Assessment: no headache, no backache, spinal receding and no apparent nausea or vomiting Anesthetic complications: no   No notable events documented.  Last Vitals:  Vitals:   07/06/23 1346 07/06/23 1540  BP: 129/82 (!) 149/86  Pulse: 72 80  Resp: 12 17  Temp: (!) 36.3 C 36.7 C  SpO2: 97% 98%    Last Pain:  Vitals:   07/06/23 1540  TempSrc: Oral  PainSc:                  Collene Schlichter

## 2023-07-06 NOTE — Op Note (Signed)
Pre and postop diagnosis: Left knee primary osteoarthritis  Procedure: Right total knee arthroplasty.  Surgeon: Annell Greening, MD  Assistant: Georgann Housekeeper, RNFA  Anesthesia: Preoperative adductor block plus spinal anesthesia plus Exparel and Marcaine 20 post 20cc  Tourniquet time 1 hour 5 minutes x 300  Implants:Implants  CEMENT HV SMART SET - [id number]  Inventory Item: CEMENT HV SMART SET Serial no.: Model/Cat no.: [id number]  Implant name: CEMENT HV SMART SET - [id number] Laterality: Left Area: Knee  Manufacturer: [employer] Date of Manufacture:   Action: Implanted Number Used: 1   Device Identifier: Device Identifier Type:   CEMENT HV SMART SET - [id number]  Inventory Item: CEMENT HV SMART SET Serial no.: Model/Cat no.: [id number]  Implant name: CEMENT HV SMART SET - [id number] Laterality: Left Area: Knee  Manufacturer: [employer] Date of Manufacture:   Action: Implanted Number Used: 1   Device Identifier: Device Identifier Type:   ATTUNE MED DOME PAT 38 KNEE - [id number]  Inventory Item: ATTUNE MED DOME PAT 38 KNEE Serial no.: Model/Cat no.: [id number]  Implant name: ATTUNE MED DOME PAT 38 KNEE - [id number] Laterality: Left Area: Knee  Manufacturer: [employer] Date of Manufacture:   Action: Implanted Number Used: 1   Device Identifier: Device Identifier Type:   ATTUNE PSRP INSR SZ7 5 KNEE - [id number]  Inventory Item: ATTUNE PSRP INSR SZ7 5 KNEE Serial no.: Model/Cat no.: [id number]  Implant name: ATTUNE PSRP INSR SZ7 5 KNEE - [id number] Laterality: Left Area: Knee  Manufacturer: [employer] Date of Manufacture:   Action: Implanted Number Used: 1   Device Identifier: Device Identifier Type:   ATTUNE PS FEM LT SZ 7 CEM KNEE - [id number]  Inventory Item: ATTUNE PS FEM LT SZ 7 CEM KNEE Serial no.: Model/Cat no.: [id number]  Implant name: ATTUNE PS FEM LT SZ 7 CEM KNEE - [id number] Laterality: Left Area: Knee  Manufacturer: [employer] Date of  Manufacture:   Action: Implanted Number Used: 1   Device Identifier: Device Identifier Type:   BSPLAT TIB 6 CMNT ROT PLAT STR - [id number]   Inventory Item: BSPLAT TIB 6 CMNT ROT PLAT STR Serial no.: Model/Cat no.: [id number]  Implant name: BSPLAT TIB 6 CMNT ROT PLAT STR - [id number] Laterality: Left Area: Knee  Manufacturer: [employer] Date of Manufacture:   Action: Implanted Number Used: 1   Brief history 62 year old male with past history of tibial fracture with retained nail that was removed few months ago.  He has severe knee osteoarthritis with varus and some posterior subluxation of the femoral condyle on the posterior tibia with bone-on-bone changes and failed conservative treatment.  Procedure: After preoperative block spinal anesthesia spine position proximal thigh tourniquet heel bump lateral post send prepped with DuraPrep the usual total knee sheets draped impervious stockinette Coban sterile skin marker Betadine Steri-Drape was used sealing the skin.  Midline incision was made after timeout procedure IV TXA and Ancef was given prophylactically.  Leg was wrapped in Esmarch tourniquet inflated.  Midline incision was made medial parapatellar incision was made.  Due to patient's knee flexion contracture and varus initial 12 mm were taken off of the distal femur and 12 was taken off of the tibia.  This allowed full extension of the knee with trials once 5 mm spacer block fit.  There were some posterior loose bodies posterior spurs that were large had to be removed and deep fibers medial collateral ligament strip three-quarter curved osteotome off the tibia medially.  Size 7 on the femur  chamfer cuts box cuts were made.  Tibia size 6 keel preparation pulse lavage vacuum mixing of the cement drilling of the leg notes.  Tibia was cemented first followed by femur.  5 mm rotating poly was inserted.  Knee came out to full extension all excessive cement was removed with Therapist, nutritional.  Patella  was held with a patellar clamp.  Exparel Marcaine was infiltrated while cement was setting up.  Cement was hardened 15 minutes tourniquet deflated hemostasis obtained and then standard layered closure with skin staples Aquacel and Mepilex followed by soft dressing Ace wrap ice machine and knee immobilizer.

## 2023-07-06 NOTE — Progress Notes (Signed)
Transition of Care [facility]) - Inpatient Brief Assessment   Patient Details  Name: Cody Carney MRN: [id number] Date of Birth: 05-07-1961  Transition of Care [facility]) CM/SW Contact:    Janae Bridgeman, RN Phone Number: 07/06/2023, 4:48 PM   Clinical Narrative: CM met with the patient at the bedside after PT evaluation.  Patient is S/P Left TKA by Dr. Ophelia Charter.  Patient provided with Medicare observation notice.  Patient plans to discharge home tomorrow with daughter to assist at his home.  Patient was provided with Medicare choice regarding home health and DME and he did not have a preference.  I called [employer] and requested RW and 3:1  - to deliver to patient's room in the am.  Orders placed.  I called Tresa Endo, CM with [facility] and she accepted for PT.  Orders placed.  The patient to be discharge home with daughter by car tomorrow.   Transition of Care Asessment: Insurance and Status: (P) Insurance coverage has been reviewed Patient has primary care physician: (P) Yes Home environment has been reviewed: (P) from home - daughter to assist   Prior/Current Home Services: (P) No current home services Social Determinants of Health Reivew: (P) SDOH reviewed interventions complete Readmission risk has been reviewed: (P) Yes Transition of care needs: (P) transition of care needs identified, TOC will continue to follow

## 2023-07-07 DIAGNOSIS — M1712 Unilateral primary osteoarthritis, left knee: Secondary | ICD-10-CM | POA: Diagnosis not present

## 2023-07-07 LAB — CBC
HCT: 34.7 % — ABNORMAL LOW (ref 39.0–52.0)
Hemoglobin: 11.8 g/dL — ABNORMAL LOW (ref 13.0–17.0)
MCH: 31.8 pg (ref 26.0–34.0)
MCHC: 34 g/dL (ref 30.0–36.0)
MCV: 93.5 fL (ref 80.0–100.0)
Platelets: 128 10*3/uL — ABNORMAL LOW (ref 150–400)
RBC: 3.71 MIL/uL — ABNORMAL LOW (ref 4.22–5.81)
RDW: 12.2 % (ref 11.5–15.5)
WBC: 13.6 10*3/uL — ABNORMAL HIGH (ref 4.0–10.5)
nRBC: 0 % (ref 0.0–0.2)

## 2023-07-07 MED ORDER — OXYCODONE-ACETAMINOPHEN 5-325 MG PO TABS
1.0000 | ORAL_TABLET | ORAL | 0 refills | Status: DC | PRN
Start: 2023-07-07 — End: 2023-07-13

## 2023-07-07 MED ORDER — ASPIRIN 81 MG PO TBEC: 81.0000 mg | DELAYED_RELEASE_TABLET | Freq: Every day | ORAL | Status: AC

## 2023-07-07 NOTE — Progress Notes (Signed)
Message sent to Persons PA regarding Pt.  Pt bleeding from left total knee surgical site total knee. I reinforced the original dressing twice and Pt bleed through both . I removed reinforced dressing and place another. Pt is not symptomatic at this time and vitals are stable. Would you like me to order a STAT H&H? Please advise. Thank you   Awaiting new orders. Nursing plan of care ongoing.

## 2023-07-07 NOTE — Progress Notes (Signed)
Tried to contact [phone]. Was answered with fax tone.

## 2023-07-07 NOTE — Progress Notes (Signed)
Received Cody Skinner MD called back. Instructed to monitor bleeding and continue to reinforce. Nursing plan of care ongoing

## 2023-07-07 NOTE — Progress Notes (Signed)
Contacted answering service call back at [phone]. "Call cannot be completed as dialed. Unable to contact any one. Nursing plan of care ongoing.

## 2023-07-07 NOTE — Progress Notes (Signed)
Pt bleeding from surgical site. Dr. Ophelia Charter was contacted via epic messenger regarding the bleeding. Pt asymptomatic and vitals are WNL. Dressing has been reinforced. Awaiting new orders.

## 2023-07-07 NOTE — Progress Notes (Signed)
Callback contacted awaiting MD call back. Nursing plan of care ongoing.

## 2023-07-07 NOTE — Plan of Care (Signed)

## 2023-07-07 NOTE — Discharge Instructions (Signed)

## 2023-07-07 NOTE — Progress Notes (Signed)
OT Screen Note  Patient Details Name: Shakeem Stern MRN: [id number] DOB: April 21, 1961   Cancelled Treatment:    Reason Eval/Treat Not Completed: OT screened, no needs identified, will sign off (Discussed with PT, pt has no functional deficits as anticipated with LB ADLs or other ADLs at this time. Reconsult OT if needed)  07/07/2023  AB, OTR/L  Acute Rehabilitation Services  Office: [phone]   Tristan Schroeder 07/07/2023, 8:05 AM

## 2023-07-07 NOTE — Progress Notes (Signed)
Physical Therapy Treatment Patient Details Name: Cody Carney MRN: [id number] DOB: 09-10-1961 Today's Date: 07/07/2023   History of Present Illness Pt is a 62 y.o. male who presented 07/06/23 for elective L TKA. PMH: CHF, aortic atherosclerosis, arthritis, carpal tunnel syndrome, COPD, DM, HTN, GERD, HLD, CVA, tobacco abuse    PT Comments  Patient progressing with hallway ambulation with improved balance and intact sensation in LE's.  Reviewed all precautions, use of ice, HEP (with emphasis on only very gentle ROM next couple of days due to bleeding overnight) and plans for HHPT follow up per MD.  Patient stable for home today and questions answered.     If plan is discharge home, recommend the following: A little help with bathing/dressing/bathroom;Assistance with cooking/housework;Assist for transportation;Help with stairs or ramp for entrance   Can travel by private vehicle        Equipment Recommendations  Rolling walker (2 wheels);BSC/3in1    Recommendations for Other Services       Precautions / Restrictions Precautions Precautions: Fall;Knee Required Braces or Orthoses: Knee Immobilizer - Left Restrictions Weight Bearing Restrictions: Yes LLE Weight Bearing: Weight bearing as tolerated     Mobility  Bed Mobility Overal bed mobility: Modified Independent                  Transfers Overall transfer level: Modified independent Equipment used: Rolling walker (2 wheels) Transfers: Sit to/from Stand             General transfer comment: stood prior to set up, no assist    Ambulation/Gait Ambulation/Gait assistance: Supervision Gait Distance (Feet): 150 Feet Assistive device: Rolling walker (2 wheels) Gait Pattern/deviations: Step-to pattern, Step-through pattern, Trunk flexed, Wide base of support, Antalgic       General Gait Details: initial close S for safety, progressing to mod I and with initial step to sequence progressing to step through with some  trunk flexion   Stairs             Wheelchair Mobility     Tilt Bed    Modified Rankin (Stroke Patients Only)       Balance     Sitting balance-Leahy Scale: Good     Standing balance support: No upper extremity supported Standing balance-Leahy Scale: Fair Standing balance comment: intial standing no UE support                            Cognition Arousal: Alert Behavior During Therapy: WFL for tasks assessed/performed                                            Exercises Total Joint Exercises Ankle Circles/Pumps: AROM, 10 reps, Supine Quad Sets: PROM, Left, 5 reps, Supine Straight Leg Raises: AROM, Left, Supine, 5 reps    General Comments General comments (skin integrity, edema, etc.): Reviewed precautions with knee immobilizer and very gentle ROM next couple days till wound can heel since had significant bleeding overnight.  Reviewed exercises on handout emphasizing ankle pumps, quad sets and SLR for now with gentle knee flexion.  Discussed use of ice esp after mobility/exercise for control of inflammation and to balance rest with activity to avoid "overdoing it".  PAtient verablized understanding of all.      Pertinent Vitals/Pain Pain Assessment Pain Assessment: 0-10 Pain Score: 7  Pain Location: L knee  Pain Descriptors / Indicators: Sore, Aching Pain Intervention(s): Limited activity within patient's tolerance    Home Living                          Prior Function            PT Goals (current goals can now be found in the care plan section) Progress towards PT goals: Progressing toward goals    Frequency    7X/week      PT Plan      Co-evaluation              AM-PAC PT "6 Clicks" Mobility   Outcome Measure  Help needed turning from your back to your side while in a flat bed without using bedrails?: None Help needed moving from lying on your back to sitting on the side of a flat bed  without using bedrails?: None Help needed moving to and from a bed to a chair (including a wheelchair)?: None Help needed standing up from a chair using your arms (e.g., wheelchair or bedside chair)?: None Help needed to walk in hospital room?: A Little Help needed climbing 3-5 steps with a railing? : A Little 6 Click Score: 22    End of Session Equipment Utilized During Treatment: Gait belt;Left knee immobilizer Activity Tolerance: Patient tolerated treatment well Patient left: in chair;with call bell/phone within reach Nurse Communication: Other (comment) (communication with RNCM regarding DME) PT Visit Diagnosis: Muscle weakness (generalized) (M62.81);Difficulty in walking, not elsewhere classified (R26.2);Pain Pain - Right/Left: Left Pain - part of body: Knee     Time: 6644-0347 PT Time Calculation (min) (ACUTE ONLY): 28 min  Charges:    $Gait Training: 8-22 mins $Therapeutic Activity: 8-22 mins PT General Charges $$ ACUTE PT VISIT: 1 Visit                     Sheran Lawless, PT Acute Rehabilitation Services Office:[phone] 07/07/2023    Elray Mcgregor 07/07/2023, 11:12 AM

## 2023-07-07 NOTE — Progress Notes (Signed)
Multiple attempts made to contact [facility] and short stay surgical in order to obtain contact information to report post op bleeding. Unable to contact any one. Nursing plan of care

## 2023-07-07 NOTE — Progress Notes (Signed)
Pt has been DC. He is waiting for the DME (RW, 3-in-1 BSC). Contacted Jermaine with [employer]. He reports that the driver is here to deliver the DME. Contacted pt and he reports that they just delivered the DME.

## 2023-07-07 NOTE — Progress Notes (Addendum)
Patient ID: Cody Carney, male   DOB: 09/02/1961, 62 y.o.   MRN: [id number]   Subjective: 1 Day Post-Op Procedure(s) (LRB): LEFT TOTAL KNEE ARTHROPLASTY (Left) Patient reports pain as mild.    Objective: Vital signs in last 24 hours: Temp:  [97.4 F (36.3 C)-98.4 F (36.9 C)] 97.9 F (36.6 C) (09/28 0422) Pulse Rate:  [65-80] 65 (09/28 0422) Resp:  [10-25] 18 (09/28 0422) BP: (107-149)/(50-86) 123/67 (09/28 0422) SpO2:  [95 %-98 %] 98 % (09/28 0422)  Intake/Output from previous day: 09/27 0701 - 09/28 0700 In: 2375.1 [I.V.:2175.1; IV Piggyback:200] Out: 2375 [Urine:2325; Blood:50] Intake/Output this shift: No intake/output data recorded.  No results for input(s): "HGB" in the last 72 hours. No results for input(s): "WBC", "RBC", "HCT", "PLT" in the last 72 hours. No results for input(s): "NA", "K", "CL", "CO2", "BUN", "CREATININE", "GLUCOSE", "CALCIUM" in the last 72 hours. No results for input(s): "LABPT", "INR" in the last 72 hours.  Dressing changed. Bleeding at top of incision . New aquacell applied and 4X 4 and ABD and then ace wrap. Can already do leg lifts and knee flexion on his own.  No results found.  Assessment/Plan: 1 Day Post-Op Procedure(s) (LRB): LEFT TOTAL KNEE ARTHROPLASTY (Left) Up with therapy, discharge home. Dressing change /reinforce by RN before discharge if needed. Office one week . Meds send to his pharmacy. HHPT 3X per week times 2 wks then OPPT will be started.   [name] 07/07/2023, 9:20 AM

## 2023-07-07 NOTE — Progress Notes (Signed)
Patient got discharged with rolling walker and 3:1. Patient transported to the front door by nurse tech.

## 2023-07-08 NOTE — Discharge Summary (Signed)
Physician Discharge Summary  Patient ID: Cody Carney MRN: [id number] DOB/AGE: 1961-09-21 62 y.o.  Admit date: 07/06/2023 Discharge date: 07/08/2023  Admission Diagnoses: Left knee primary osteoarthritis  Discharge Diagnoses:  Principal Problem:   OA (osteoarthritis) of knee Active Problems:   S/P total knee arthroplasty, left   Discharged Condition: good  Hospital Course: Patient had past history of tibial fracture years ago had a tibial nail removed several months ago in preparation for his left total knee arthroplasty has bone-on-bone tricompartmental degenerative changes mild varus with bone-on-bone medial compartment and failed conservative treatment including anti-inflammatory therapy intra-articular injections and use of cane/walker.  Patient admitted underwent total knee arthroplasty with preoperative adductor block spinal anesthesia, Exparel and Marcaine.  Postop day 1 he could lift his leg prior to being seen by therapy was flexing on the CPM and can flex on his own to 80 degrees.  He made rapid progress postop day 1 and was discharged home with home health PT scheduled 3 times a week x 2 weeks.  Consults: PT and OT  Significant Diagnostic Studies: Routine admission labs negative PCR.  Treatments: Left total knee arthroplasty.  Discharge Exam: Blood pressure 115/64, pulse 79, temperature 98.1 F (36.7 C), temperature source Oral, resp. rate 18, height 5\' 8"  (1.727 m), weight 95.3 kg, SpO2 98%. Dressing was changed postop swelling was down.  Proximal portion of his wound where he been using the CPM had some drainage and this was dressed with 4 x 4's ABD after new Aquacel was applied.  Disposition: Discharge disposition: 01-Home or Self Care      Discharge home also follow-up 1 week.  Allergies as of 07/07/2023       Reactions   Codeine Nausea And Vomiting        Medication List     STOP taking these medications    naproxen sodium 220 MG tablet Commonly known  as: ALEVE       TAKE these medications    aspirin EC 81 MG tablet Take 1 tablet (81 mg total) by mouth daily. Swallow whole. Take for 4 wks then stop   atorvastatin 80 MG tablet Commonly known as: LIPITOR Take 80 mg by mouth at bedtime.   carvedilol 12.5 MG tablet Commonly known as: COREG Take 12.5 mg by mouth 2 (two) times daily with a meal.   cetirizine 10 MG tablet Commonly known as: ZYRTEC Take 10 mg by mouth daily.   clopidogrel 75 MG tablet Commonly known as: PLAVIX Take 75 mg by mouth daily.   Farxiga 10 MG Tabs tablet Generic drug: dapagliflozin propanediol TAKE ONE TABLET BY MOUTH DAILY AT 9AM BEFORE BREAKFAST   furosemide 80 MG tablet Commonly known as: LASIX Take 2 tablets (160 mg total) by mouth daily.   gabapentin 300 MG capsule Commonly known as: NEURONTIN Take 300 mg by mouth at bedtime.   metFORMIN 500 MG tablet Commonly known as: GLUCOPHAGE Take 500 mg by mouth 2 (two) times daily with a meal.   omeprazole 20 MG capsule Commonly known as: PRILOSEC Take 20 mg by mouth daily.   oxyCODONE-acetaminophen 5-325 MG tablet Commonly known as: Percocet Take 1 tablet by mouth every 4 (four) hours as needed for severe pain.   potassium chloride 10 MEQ tablet Commonly known as: KLOR-CON Take 1 tablet (10 mEq total) by mouth daily.   sildenafil 50 MG tablet Commonly known as: Viagra Take 1 tablet (50 mg total) by mouth daily as needed for erectile dysfunction.   spironolactone 25 MG  tablet Commonly known as: ALDACTONE Take 12.5 mg by mouth at bedtime.   Vitamin D3 1000 units Caps Take 1,000 Units by mouth daily.        Follow-up Information     [facility] Follow up.   Specialty: Home Health Services Why: [facility] to provide home health services.  They will call you in the next 24-48 hours to start PT services. Contact information: [address] [place] [address] [phone]         [facility]  Follow up.   Why: [employer] will provide a Rw and 3:1 to your hospital room before you are discharged. Contact information: [address] [place] [address] [phone]         [name], MD Follow up in 1 week(s).   Specialty: Orthopedic Surgery Contact information: [address] [place] [address] [phone]                 Signed: [name] 07/08/2023, 4:59 PM

## 2023-07-09 ENCOUNTER — Telehealth: Payer: Self-pay | Admitting: Orthopaedic Surgery

## 2023-07-09 NOTE — Telephone Encounter (Signed)
Cody Carney (PT) from [facility] called requesting verbal orders for 4 wk 1wk, and 2wk 1. Please call Jae Dire on secure line or leave detailed message at [phone].

## 2023-07-10 NOTE — Telephone Encounter (Signed)
IC LMVM verbal order given

## 2023-07-13 ENCOUNTER — Other Ambulatory Visit (INDEPENDENT_AMBULATORY_CARE_PROVIDER_SITE_OTHER): Payer: 59

## 2023-07-13 ENCOUNTER — Ambulatory Visit (INDEPENDENT_AMBULATORY_CARE_PROVIDER_SITE_OTHER): Payer: 59 | Admitting: Orthopaedic Surgery

## 2023-07-13 VITALS — BP 109/72 | HR 98

## 2023-07-13 DIAGNOSIS — Z96652 Presence of left artificial knee joint: Secondary | ICD-10-CM | POA: Diagnosis not present

## 2023-07-13 MED ORDER — OXYCODONE-ACETAMINOPHEN 5-325 MG PO TABS: 1.0000 | ORAL_TABLET | ORAL | 0 refills | Status: DC | PRN

## 2023-07-13 NOTE — Progress Notes (Unsigned)
Post-Op Visit Note   Patient: Cody Carney           Date of Birth: Dec 13, 1960           MRN: [id number] Visit Date: 07/13/2023 PCP: Lars Mage, NP   Assessment & Plan: Postop total knee arthroplasty left knee.  Incision looks good staples intact.  He has mild swelling not severe enough for aspiration.  Pain medication renewed he is still in home PT.  Will set up outpatient PT at deep [facility] therapy.  Return 1 week for probable staple removal and Steri-Strips.  Chief Complaint:  Chief Complaint  Patient presents with   Left Knee - Routine Post Op   Visit Diagnoses:  1. Status post left knee replacement     Plan: ROV  1wk  Follow-Up Instructions: No follow-ups on file.   Orders:  Orders Placed This Encounter  Procedures   XR Knee 1-2 Views Left   Meds ordered this encounter  Medications   oxyCODONE-acetaminophen (PERCOCET) 5-325 MG tablet    Sig: Take 1 tablet by mouth every 4 (four) hours as needed for severe pain.    Dispense:  40 tablet    Refill:  0    Post op total knee    Imaging: XR Knee 1-2 Views Left  Result Date: 07/13/2023 AP lateral left knee demonstrates good position alignment total knee arthroplasty in satisfactory position. Impression: Satisfactory left total knee arthroplasty.   PMFS History: Patient Active Problem List   Diagnosis Date Noted   OA (osteoarthritis) of knee 07/06/2023   S/P total knee arthroplasty, left 07/06/2023   Bilateral carpal tunnel syndrome 12/05/2022   Painful orthopaedic hardware [facility])    Patellar dislocation, left, subsequent encounter 12/05/2021   Bennett's fracture of base of metacarpal bone of right thumb 11/03/2021   Clavicle fracture 06/28/2021   Unilateral primary osteoarthritis, left knee 05/30/2021   HFrEF (heart failure with reduced ejection fraction) (HCC) 11/24/2020   Aortic atherosclerosis (HCC) 10/12/2020   Coronary artery calcification 10/12/2020   Mixed hyperlipidemia 10/12/2020   COPD  (chronic obstructive pulmonary disease) (HCC) 08/08/2020   Essential hypertension 08/08/2020   Tobacco abuse 08/08/2020   Past Medical History:  Diagnosis Date   Acute exacerbation of CHF (congestive heart failure) (HCC) 08/08/2020   Aortic atherosclerosis (HCC) 10/12/2020   Arthritis    Left Knee   Carpal tunnel syndrome    Bilateral   CHF (congestive heart failure) (HCC)    COPD (chronic obstructive pulmonary disease) (HCC)    Coronary artery calcification 10/12/2020   Diabetes mellitus without complication (HCC)    Type 2   Essential hypertension 08/08/2020   GERD (gastroesophageal reflux disease)    HFrEF (heart failure with reduced ejection fraction) (HCC) 11/24/2020   Hypertension    Ischemic chest pain (HCC) 08/08/2020   Mixed hyperlipidemia 10/12/2020   Stroke (HCC) 2023   TIA   Tobacco abuse 08/08/2020    No family history on file.  Past Surgical History:  Procedure Laterality Date   HARDWARE REMOVAL Left 04/05/2022   Procedure: LEFT TIBIA NAIL AND INTERLOCK SCREWS REMOVAL;  Surgeon: [name], MD;  Location: [facility] OR;  Service: Orthopedics;  Laterality: Left;   LEG SURGERY Left    Fracture Surgery   RIGHT/LEFT HEART CATH AND CORONARY ANGIOGRAPHY N/A 11/26/2020   Procedure: RIGHT/LEFT HEART CATH AND CORONARY ANGIOGRAPHY;  Surgeon: Lennette Bihari, MD;  Location: [facility] INVASIVE CV LAB;  Service: Cardiovascular;  Laterality: N/A;   TOTAL KNEE  ARTHROPLASTY Left 07/06/2023   Procedure: LEFT TOTAL KNEE ARTHROPLASTY;  Surgeon: [name], MD;  Location: [facility] OR;  Service: Orthopedics;  Laterality: Left;  Needs RNFA   Social History   Occupational History   Not on file  Tobacco Use   Smoking status: Every Day    Current packs/day: 0.25    Average packs/day: 0.3 packs/day for 39.0 years (9.8 ttl pk-yrs)    Types: Cigarettes   Smokeless tobacco: Never   Tobacco comments:    encouraged smoking cessation, offered nicotine patch upon DC--relayed msg to primary team   Vaping Use   Vaping status: Never Used  Substance and Sexual Activity   Alcohol use: Not Currently    Alcohol/week: 2.0 standard drinks of alcohol    Types: 2 Cans of beer per week    Comment: per pt statement 2 cans beer on weekend with friends occassionally   Drug use: Not Currently    Types: Marijuana    Comment: Hasn't smoked in a "long time"   Sexual activity: Not Currently

## 2023-07-18 ENCOUNTER — Ambulatory Visit (INDEPENDENT_AMBULATORY_CARE_PROVIDER_SITE_OTHER): Payer: 59 | Admitting: Orthopaedic Surgery

## 2023-07-18 ENCOUNTER — Encounter: Payer: Self-pay | Admitting: Orthopaedic Surgery

## 2023-07-18 VITALS — Ht 68.0 in | Wt 210.0 lb

## 2023-07-18 DIAGNOSIS — Z96652 Presence of left artificial knee joint: Secondary | ICD-10-CM

## 2023-07-18 MED ORDER — OXYCODONE-ACETAMINOPHEN 5-325 MG PO TABS: 1.0000 | ORAL_TABLET | ORAL | 0 refills | Status: DC | PRN

## 2023-07-18 NOTE — Progress Notes (Unsigned)
Post-Op Visit Note   Patient: Cody Carney           Date of Birth: January 07, 1961           MRN: [id number] Visit Date: 07/18/2023 PCP: Lars Mage, NP   Assessment & Plan: Post left total knee arthroplasty.  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  He has some ecchymosis where the tourniquet was applied patient had a stiff arthritic knee posttraumatic with varus.  H continue with extension and flexion he needs to lay down and elevate his leg above his heart for the swelling.  e only has a few pain tablets left and Percocet renewal is sent in.  Will check a venous Doppler rule out DVT left lower extremity.  He is transitioning to outpatient therapy next week.  Chief Complaint:  Chief Complaint  Patient presents with   Left Knee - Routine Post Op   Visit Diagnoses:  1. Status post left knee replacement   2. S/P total knee arthroplasty, left     Plan: Continue therapy venous Doppler rule out DVT recheck 4 weeks.  If he has DVT we discussed the need to be on anticoagulant.  In the meantime he will continue the aspirin.  Follow-Up Instructions: Return in about 4 weeks (around 08/15/2023).   Orders:  Orders Placed This Encounter  Procedures   VAS [place] LOWER EXTREMITY VENOUS (DVT)   Meds ordered this encounter  Medications   oxyCODONE-acetaminophen (PERCOCET) 5-325 MG tablet    Sig: Take 1 tablet by mouth every 4 (four) hours as needed for severe pain.    Dispense:  40 tablet    Refill:  0    Post op total knee    Imaging: No results found.  PMFS History: Patient Active Problem List   Diagnosis Date Noted   S/P total knee arthroplasty, left 07/06/2023   Bilateral carpal tunnel syndrome 12/05/2022   Painful orthopaedic hardware [facility])    Patellar dislocation, left, subsequent encounter 12/05/2021   Bennett's fracture of base of metacarpal bone of right  thumb 11/03/2021   Clavicle fracture 06/28/2021   HFrEF (heart failure with reduced ejection fraction) (HCC) 11/24/2020   Aortic atherosclerosis (HCC) 10/12/2020   Coronary artery calcification 10/12/2020   Mixed hyperlipidemia 10/12/2020   COPD (chronic obstructive pulmonary disease) (HCC) 08/08/2020   Essential hypertension 08/08/2020   Tobacco abuse 08/08/2020   Past Medical History:  Diagnosis Date   Acute exacerbation of CHF (congestive heart failure) (HCC) 08/08/2020   Aortic atherosclerosis (HCC) 10/12/2020   Arthritis    Left Knee   Carpal tunnel syndrome    Bilateral   CHF (congestive heart failure) (HCC)    COPD (chronic obstructive pulmonary disease) (HCC)    Coronary artery calcification 10/12/2020   Diabetes mellitus without complication (HCC)    Type 2   Essential hypertension 08/08/2020   GERD (gastroesophageal reflux disease)    HFrEF (heart failure with reduced ejection fraction) (HCC) 11/24/2020   Hypertension    Ischemic chest pain (HCC) 08/08/2020   Mixed hyperlipidemia 10/12/2020   Stroke (HCC) 2023   TIA   Tobacco abuse 08/08/2020    History reviewed. No pertinent family history.  Past Surgical History:  Procedure Laterality Date   HARDWARE REMOVAL Left 04/05/2022   Procedure: LEFT TIBIA NAIL AND INTERLOCK SCREWS REMOVAL;  Surgeon: [name],  MD;  Location: [facility] OR;  Service: Orthopedics;  Laterality: Left;   LEG SURGERY Left    Fracture Surgery   RIGHT/LEFT HEART CATH AND CORONARY ANGIOGRAPHY N/A 11/26/2020   Procedure: RIGHT/LEFT HEART CATH AND CORONARY ANGIOGRAPHY;  Surgeon: Lennette Bihari, MD;  Location[facility] INVASIVE CV LAB;  Service: Cardiovascular;  Laterality: N/A;   TOTAL KNEE ARTHROPLASTY Left 07/06/2023   Procedure: LEFT TOTAL KNEE ARTHROPLASTY;  Surgeon: [name], MD;  Location: [facility] OR;  Service: Orthopedics;  Laterality: Left;  Needs RNFA   Social History   Occupational History   Not on file  Tobacco Use   Smoking status: Every  Day    Current packs/day: 0.25    Average packs/day: 0.3 packs/day for 39.0 years (9.8 ttl pk-yrs)    Types: Cigarettes   Smokeless tobacco: Never   Tobacco comments:    encouraged smoking cessation, offered nicotine patch upon DC--relayed msg to primary team  Vaping Use   Vaping status: Never Used  Substance and Sexual Activity   Alcohol use: Not Currently    Alcohol/week: 2.0 standard drinks of alcohol    Types: 2 Cans of beer per week    Comment: per pt statement 2 cans beer on weekend with friends occassionally   Drug use: Not Currently    Types: Marijuana    Comment: Hasn't smoked in a "long time"   Sexual activity: Not Currently

## 2023-07-20 ENCOUNTER — Telehealth: Payer: Self-pay | Admitting: Orthopaedic Surgery

## 2023-07-20 ENCOUNTER — Ambulatory Visit (HOSPITAL_BASED_OUTPATIENT_CLINIC_OR_DEPARTMENT_OTHER): Payer: 59

## 2023-07-20 DIAGNOSIS — Z96652 Presence of left artificial knee joint: Secondary | ICD-10-CM

## 2023-07-20 NOTE — Telephone Encounter (Signed)
Returned call

## 2023-07-20 NOTE — Telephone Encounter (Signed)
Kate white Physical Therapist from [facility]  is returning a call. CB#[phone]

## 2023-07-20 NOTE — Telephone Encounter (Signed)
Called and lvm

## 2023-07-20 NOTE — Telephone Encounter (Signed)
Cody Carney (PT) from [facility] called for vebral orders for 1wk 1, ans 2 wk 2. Please call Jae Dire on secure line or leave detailed vm at [phone].

## 2023-07-26 ENCOUNTER — Telehealth: Payer: Self-pay | Admitting: Orthopaedic Surgery

## 2023-07-26 ENCOUNTER — Other Ambulatory Visit: Payer: Self-pay | Admitting: Orthopaedic Surgery

## 2023-07-26 MED ORDER — OXYCODONE-ACETAMINOPHEN 5-325 MG PO TABS: 1.0000 | ORAL_TABLET | ORAL | 0 refills | Status: AC | PRN

## 2023-07-26 NOTE — Telephone Encounter (Signed)
Pt called requesting refill of pain medication. Please send to pharmacy on file. Pt phone number is [phone]

## 2023-08-03 ENCOUNTER — Other Ambulatory Visit: Payer: Self-pay | Admitting: Orthopaedic Surgery

## 2023-08-03 ENCOUNTER — Telehealth: Payer: Self-pay | Admitting: Orthopaedic Surgery

## 2023-08-03 NOTE — Telephone Encounter (Signed)
Patient called needing Rx refilled Oxycodone. The number to contact patient is [phone]

## 2023-08-03 NOTE — Telephone Encounter (Signed)
noted 

## 2023-08-07 ENCOUNTER — Other Ambulatory Visit: Payer: Self-pay | Admitting: Orthopaedic Surgery

## 2023-08-07 ENCOUNTER — Telehealth: Payer: Self-pay | Admitting: Orthopaedic Surgery

## 2023-08-07 NOTE — Telephone Encounter (Signed)
noted 

## 2023-08-07 NOTE — Telephone Encounter (Signed)
Patient called. Says he would like a refill on his pain medication. WU#[phone]

## 2023-08-24 ENCOUNTER — Telehealth: Payer: Self-pay | Admitting: Internal Medicine

## 2023-08-24 NOTE — Telephone Encounter (Signed)
Patient called and LVM on my machine that he had a question about a medication. It looks like his question has been answered by Dr. Salena Saner. I did call pt back and LVM that if he still had a question he could call back.

## 2023-08-24 NOTE — Telephone Encounter (Signed)
Pt c/o medication issue:  1. Name of Medication: Cialis 50 mg tabs  2. How are you currently taking this medication (dosage and times per day)? Has not had it prescribed yet   3. Are you having a reaction (difficulty breathing--STAT)?   4. What is your medication issue?  Patient's PCP requested call back to discuss if this medication would be okay for patient to take. Please advise.

## 2023-08-24 NOTE — Telephone Encounter (Signed)
Spoke with Dr. Izora Ribas he is ok with pt taking Cialis in place of Viagra.  Pt needs to be advised to monitor for hypotension.  Pt request that I call PCP office Tetter, NP and advise of MD response. Called PCP office spoke with Alyson Reedy advised of the above information. Provided with office number if PCP has additional questions.

## 2023-11-12 ENCOUNTER — Other Ambulatory Visit: Payer: Self-pay | Admitting: Internal Medicine

## 2023-11-22 ENCOUNTER — Telehealth: Payer: Self-pay | Admitting: Internal Medicine

## 2023-11-22 MED ORDER — DAPAGLIFLOZIN PROPANEDIOL 10 MG PO TABS: 10.0000 mg | ORAL_TABLET | Freq: Every day | ORAL | 4 refills | Status: DC

## 2023-11-22 NOTE — Telephone Encounter (Signed)
*  STAT* If patient is at the pharmacy, call can be transferred to refill team.   1. Which medications need to be refilled? (please list name of each medication and dose if known) dapagliflozin propanediol (FARXIGA) 10 MG TABS tablet   2. Which pharmacy/location (including street and city if local pharmacy) is medication to be sent to?  [facility] - [address] - [place], [place] - [address]   3. Do they need a 30 day or 90 day supply? 30

## 2023-12-31 ENCOUNTER — Telehealth: Payer: Self-pay

## 2023-12-31 ENCOUNTER — Telehealth: Payer: Self-pay | Admitting: Pharmacy Technician

## 2023-12-31 ENCOUNTER — Other Ambulatory Visit (HOSPITAL_COMMUNITY): Payer: Self-pay

## 2023-12-31 MED ORDER — DAPAGLIFLOZIN PROPANEDIOL 10 MG PO TABS: 10.0000 mg | ORAL_TABLET | Freq: Every day | ORAL | 2 refills | Status: AC

## 2023-12-31 NOTE — Telephone Encounter (Signed)
 Dr. Izora Ribas received paperwork from [facility] that Dapagliflozin 10 mg is no longer on pt formulary.  Can you follow up.

## 2023-12-31 NOTE — Telephone Encounter (Signed)
 Placed an order for Farxiga 10 mg generic is automatically included added a note to pharmacy to dispense Brand name only per insurance.

## 2023-12-31 NOTE — Telephone Encounter (Signed)
   Ran test claim for FARXIGA. For a 30 day supply and the co-pay is 0.00 . PA is not needed at this time. Nothing saying this is a transition fill. This test claim was processed through [facility]- copay amounts may vary at other pharmacies due to pharmacy/plan contracts, or as the patient moves through the different stages of their insurance plan.      INSURANCE WILL PAY FOR BRAND. I called the patient's pharmacy [facility] and asked them to run for brand

## 2024-03-05 ENCOUNTER — Telehealth: Payer: Self-pay

## 2024-03-05 NOTE — Telephone Encounter (Signed)
   Pre-operative Risk Assessment    Patient Name: Cody Carney  DOB: August 21, 1961 MRN: [id number]   Date of last office visit: 02/09/23 Lovette Rud, PA-C Date of next office visit: NONE   Request for Surgical Clearance    Procedure:  COLONOSCOPY  Date of Surgery:  Clearance 04/15/24                                Surgeon:  DR Micki Alas Surgeon's Group or Practice Name:  [facility] Phone number:  [phone] Fax number:  [phone]   Type of Clearance Requested:   - Medical  - Pharmacy:  Hold Aspirin  AND PLAVIX  JULY 3RD   Type of Anesthesia:  Not Indicated   Additional requests/questions:    Signed, Collin Deal   03/05/2024, 1:12 PM

## 2024-03-05 NOTE — Telephone Encounter (Signed)
   Name: Cody Carney  DOB: [birth year]  MRN: [id number]  Primary Cardiologist: Jann Melody, MD  Chart reviewed as part of pre-operative protocol coverage. Because of [name] past medical history and time since last visit, he will require a follow-up in-office visit in order to better assess preoperative cardiovascular risk.  Pre-op covering staff: - Please schedule appointment and call patient to inform them. If patient already had an upcoming appointment within acceptable timeframe, please add "pre-op clearance" to the appointment notes so provider is aware. - Please contact requesting surgeon's office via preferred method (i.e, phone, fax) to inform them of need for appointment prior to surgery.  Per office protocol, if patient is without any new symptoms or concerns at the time of their visit, he/she may hold Plavix  for 5 days prior to procedure. Please resume Plavix  as soon as possible postprocedure, at the discretion of the surgeon. We recommend patient take Aspirin  81 mg daily throughout the perioperative period.  Please discontinue Aspirin  upon resumption of Plavix .   Leala Prince, PA-C  03/05/2024, 2:00 PM

## 2024-03-05 NOTE — Telephone Encounter (Signed)
 Called pt to scheduled office visit for preop clearance. Please reach out to preop team if pt returns call. Thank you

## 2024-03-06 NOTE — Telephone Encounter (Signed)
 I have attempted to contact this patient by phone to schedule an in office visit for pre-op clearance but voicemail is full and cannot leave a message. I will continue to try later.

## 2024-03-07 NOTE — Telephone Encounter (Signed)
 Pt has been scheduled to see Katlyn West, NP 03/18/24 @ 8:50. I will update all parties involved.

## 2024-03-13 NOTE — Telephone Encounter (Signed)
 2nd clearance request received will update surgeons office that patient has the following IN OFFICE appt to address preop clearance  Appointment: 03/18/24 with Katlyn West, NP

## 2024-03-16 NOTE — Progress Notes (Deleted)
 Cardiology Office Note    Date:  03/16/2024  ID:  Cody Carney, DOB [birth year], MRN [id number] PCP:  Teofilo Fellers, NP  Cardiologist:  Jann Melody, MD  Electrophysiologist:  None   Chief Complaint: ***  History of Present Illness: .    Cody Carney is a 63 y.o. male with visit-pertinent history of hypertension, aortic atherosclerosis with coronary artery calcifications, COPD and tobacco use change with lymph node seen on CT scan.    Patient was first evaluated 08/2020 with decompensated heart failure, he was started on GDMT at that time.  Echocardiogram indicated LVEF of 30 to 35%. Right and left cardiac catheterization indicated normal coronary arteries with dominant left circumflex system, findings consistent with nonischemic cardiomyopathy.  He has been patient was transition to Lasix  grams twice daily and started on spironolactone  as he works very send.  He was lost to follow-up due to social problems, he then had a TIA in November 2022.  He restarted GDMT in 2023, following had LVEF improved to over 45%.  Patient was last seen in clinic on 02/09/2023 for preoperative clearance.  He remains well from cardiac standpoint.  He reported shortness of breath that was chronic studies were completed.  He reported that he had another TIA back in November 2023.   Today he presents for follow-up.  He reports that he   Preoperative cardaic evaluation: Patient currently pending colonoscopy  {Click Here to Calculate RCRI      :409811914}  { Click Here to Calculate DASI      :782956213} {Select to add RCRI Risk (<1%=LOW; >/=1%=HIGH) (Optional):21036017}  {Select if HIGH (RCRI >/=1%) Risk (Optional):21036030} Recommendations: {2014 ACC/AHA Perioperative Guidelines  :21036001} Antiplatelet and/or Anticoagulation Recommendations: {Antiplatelet Recommendations                  :21036016} {Anticoagulation Recommendations           :21036019}   Heart failure with reduced ejection fraction:  Echocardiogram in 2023 indicating improvement in patient's LVEF from 30 to 35% to 45%.  Patient is Today he appears Aortic atherosclerosis/CAD:  Stable with no anginal symptoms. No indication for ischemic evaluation.   Continue  HLD: Last lipid profile  Contnue  History of TIA:  On Plavix   Tobacco use   Labwork independently reviewed:   ROS: .   *** denies chest pain, shortness of breath, lower extremity edema, fatigue, palpitations, melena, hematuria, hemoptysis, diaphoresis, weakness, presyncope, syncope, orthopnea, and PND.  All other systems are reviewed and otherwise negative.  Studies Reviewed: Aaron Aas    EKG:  EKG is ordered today, personally reviewed, demonstrating ***     CV Studies: Cardiac studies reviewed are outlined and summarized above. Otherwise please see EMR for full report. Cardiac Studies & Procedures   ______________________________________________________________________________________________ CARDIAC CATHETERIZATION  CARDIAC CATHETERIZATION 11/26/2020  Conclusion Normal coronary arteries with a dominant left circumflex system.  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  RECOMMENDATION: Guideline directed medical therapy for HFrEF.  Smoking cessation.  Findings Coronary Findings Diagnostic  Dominance: Left  No diagnostic findings have been documented. Intervention  No interventions have been documented.     ECHOCARDIOGRAM  ECHOCARDIOGRAM COMPLETE 01/12/2022  Narrative ECHOCARDIOGRAM REPORT    Patient Name:   Cody Carney Date of Exam: 01/12/2022 Medical Rec #:  [id number]  Height:       68.0 in Accession #:    [id number] Weight:       224.0 lb Date of Birth:  12-10-1960  BSA:          2.145 m Patient Age:    60 years   BP:           118/70 mmHg Patient Gender: M          HR:           72 bpm. Exam Location:  [place]  Procedure: 2D Echo, Cardiac Doppler, Color Doppler and Strain  Analysis  Indications:    HFrEF (heart failure with reduced ejection fraction) (HCC) [I50.20 (ICD-10-CM)]; Aortic atherosclerosis (HCC) [I70.0 (ICD-10-CM)]  History:        Patient has prior history of Echocardiogram examinations, most recent 08/29/2021. Coronary artery calcification, Aortic atherosclerosis; Risk Factors:Hypertension and Current Smoker.  Sonographer:    Kristen Petri RDCS Referring Phys: [id number] [facility]  IMPRESSIONS   1. Improved LVEF - was 35-40% (08/2021), now 45-50%. MR from moderate decreased now to mild. GLS -11.9. Left ventricular ejection fraction, by estimation, is 45 to 50%. The left ventricle has mildly decreased function. The left ventricle has no regional wall motion abnormalities. Left ventricular diastolic parameters are consistent with Grade II diastolic dysfunction (pseudonormalization). 2. Right ventricular systolic function is normal. The right ventricular size is normal. There is normal pulmonary artery systolic pressure. 3. The mitral valve is normal in structure. Mild mitral valve regurgitation. No evidence of mitral stenosis. 4. The aortic valve is normal in structure. Aortic valve regurgitation is not visualized. No aortic stenosis is present. 5. The inferior vena cava is normal in size with greater than 50% respiratory variability, suggesting right atrial pressure of 3 mmHg.  FINDINGS Left Ventricle: Improved LVEF - was 35-40% (08/2021), now 45-50%. MR from moderate decreased now to mild. GLS -11.9. Left ventricular ejection fraction, by estimation, is 45 to 50%. The left ventricle has mildly decreased function. The left ventricle has no regional wall motion abnormalities. The left ventricular internal cavity size was normal in size. There is borderline left ventricular hypertrophy. Left ventricular diastolic parameters are consistent with Grade II diastolic dysfunction (pseudonormalization).  Right Ventricle: The right ventricular size  is normal. No increase in right ventricular wall thickness. Right ventricular systolic function is normal. There is normal pulmonary artery systolic pressure. The tricuspid regurgitant velocity is 1.70 m/s, and with an assumed right atrial pressure of 3 mmHg, the estimated right ventricular systolic pressure is 14.6 mmHg.  Left Atrium: Left atrial size was normal in size.  Right Atrium: Right atrial size was normal in size.  Pericardium: There is no evidence of pericardial effusion.  Mitral Valve: The mitral valve is normal in structure. Mild mitral valve regurgitation. No evidence of mitral valve stenosis.  Tricuspid Valve: The tricuspid valve is normal in structure. Tricuspid valve regurgitation is not demonstrated. No evidence of tricuspid stenosis.  Aortic Valve: The aortic valve is normal in structure. Aortic valve regurgitation is not visualized. No aortic stenosis is present.  Pulmonic Valve: The pulmonic valve was normal in structure. Pulmonic valve regurgitation is not visualized. No evidence of pulmonic stenosis.  Aorta: The aortic root is normal in size and structure.  Venous: The inferior vena cava is normal in size with greater than 50% respiratory variability, suggesting right atrial pressure of 3 mmHg.  IAS/Shunts: No atrial level shunt detected by color flow Doppler.   LEFT VENTRICLE PLAX 2D LVIDd:         5.50 cm      Diastology LVIDs:         4.60 cm  LV e' medial:    5.87 cm/s LV PW:         1.10 cm      LV E/e' medial:  14.9 LV IVS:        1.10 cm      LV e' lateral:   8.05 cm/s LVOT diam:     2.10 cm      LV E/e' lateral: 10.9 LV SV:         60 LV SV Index:   28 LVOT Area:     3.46 cm  LV Volumes (MOD) LV vol d, MOD A2C: 115.0 ml LV vol d, MOD A4C: 125.0 ml LV vol s, MOD A2C: 60.1 ml LV vol s, MOD A4C: 71.3 ml LV SV MOD A2C:     54.9 ml LV SV MOD A4C:     125.0 ml LV SV MOD BP:      57.6 ml  RIGHT VENTRICLE            IVC RV S prime:     9.14  cm/s  IVC diam: 1.60 cm TAPSE (M-mode): 2.5 cm  LEFT ATRIUM             Index        RIGHT ATRIUM           Index LA diam:        3.90 cm 1.82 cm/m   RA Area:     15.40 cm LA Vol (A2C):   50.0 ml 23.31 ml/m  RA Volume:   40.10 ml  18.70 ml/m LA Vol (A4C):   47.6 ml 22.20 ml/m LA Biplane Vol: 52.6 ml 24.53 ml/m AORTIC VALVE LVOT Vmax:   80.30 cm/s LVOT Vmean:  58.300 cm/s LVOT VTI:    0.172 m  AORTA Ao Root diam: 3.10 cm Ao Asc diam:  3.35 cm Ao Desc diam: 2.00 cm  MITRAL VALVE               TRICUSPID VALVE MV Area (PHT): 3.11 cm    TR Peak grad:   11.6 mmHg MV Decel Time: 244 msec    TR Vmax:        170.00 cm/s MV E velocity: 87.40 cm/s MV A velocity: 92.10 cm/s  SHUNTS MV E/A ratio:  0.95        Systemic VTI:  0.17 m Systemic Diam: 2.10 cm  Ralene Burger MD Electronically signed by Ralene Burger MD Signature Date/Time: 01/12/2022/4:02:06 PM    Final          ______________________________________________________________________________________________       Current Reported Medications:.    No outpatient medications have been marked as taking for the 03/18/24 encounter (Appointment) with Madalyne Husk D, NP.   Current Facility-Administered Medications for the 03/18/24 encounter (Appointment) with Calle Schader D, NP  Medication   ondansetron  (ZOFRAN ) 4 mg in sodium chloride  0.9 % 50 mL IVPB    Physical Exam:    VS:  There were no vitals taken for this visit.   Wt Readings from Last 3 Encounters:  07/18/23 210 lb (95.3 kg)  07/06/23 210 lb (95.3 kg)  06/13/23 227 lb 6.4 oz (103.1 kg)    GEN: Well nourished, well developed in no acute distress NECK: No JVD; No carotid bruits CARDIAC: ***RRR, no murmurs, rubs, gallops RESPIRATORY:  Clear to auscultation without rales, wheezing or rhonchi  ABDOMEN: Soft, non-tender, non-distended EXTREMITIES:  No edema; No acute deformity     Asessement and Plan:.     ***  Disposition: F/u with  ***  Signed, Selah Klang D Symia Herdt, NP

## 2024-03-18 ENCOUNTER — Ambulatory Visit: Attending: Cardiology | Admitting: Cardiology

## 2024-03-18 DIAGNOSIS — I700 Atherosclerosis of aorta: Secondary | ICD-10-CM

## 2024-03-18 DIAGNOSIS — Z8673 Personal history of transient ischemic attack (TIA), and cerebral infarction without residual deficits: Secondary | ICD-10-CM

## 2024-03-18 DIAGNOSIS — I5020 Unspecified systolic (congestive) heart failure: Secondary | ICD-10-CM

## 2024-03-18 DIAGNOSIS — Z01810 Encounter for preprocedural cardiovascular examination: Secondary | ICD-10-CM

## 2024-03-18 DIAGNOSIS — I2510 Atherosclerotic heart disease of native coronary artery without angina pectoris: Secondary | ICD-10-CM

## 2024-03-18 DIAGNOSIS — Z720 Tobacco use: Secondary | ICD-10-CM

## 2024-04-05 NOTE — Progress Notes (Unsigned)
 Cardiology Office Note    Date:  04/07/2024  ID:  Cody Carney, DOB 1961-08-17, MRN [id number] PCP:  Nestor Elston NOVAK, NP  Cardiologist:  Stanly DELENA Leavens, MD  Electrophysiologist:  None   Chief Complaint: Follow up for HF   History of Present Illness: .    Cody Carney is a 63 y.o. male with visit-pertinent history of hypertension, HFrEF, aortic atherosclerosis with coronary artery calcifications, COPD and tobacco use.    Patient was first evaluated 08/2020 with decompensated heart failure, he was started on GDMT at that time.  Echocardiogram indicated LVEF of 30 to 35%. Right and left cardiac catheterization indicated normal coronary arteries with dominant left circumflex system, findings consistent with nonischemic cardiomyopathy.  He has been patient was transition to Lasix  grams twice daily and started on spironolactone  as he works very send.  He was lost to follow-up due to social problems, he then had a TIA in November 2022.  He restarted GDMT in 2023, following had LVEF improved to over 45%.  Patient was last seen in clinic on 02/09/2023 for preoperative clearance.  He remained well from cardiac standpoint.  He reported shortness of breath that was chronic. He reported that he had another TIA back in November 2023 and had recently been restarted on metformin  500 mg twice daily.  Patient reported he previously been smoking 2 packs a day and was down to half a pack a day.  Today he presents for follow-up.  He reports that he has been doing well overall.  He denies any chest pain, notes some mild chronic shortness of breath but slightly worsened with his allergies, notes it has since improved and is doing well now.  He endorses some occasional lower extremity edema if he forgets to take his medications, reports quickly resolves with taking.  He denies any orthopnea, PND, palpitations, presyncope or syncope.  He is able to achieve greater than 4 METS of activity with ADLs, chores, walking and  swimming.  Patient denies any cardiac concerns or complaints today.  Patient reports that he has recently had lab work completed at his PCPs office. ROS: .   Today he denies chest pain, shortness of breath, lower extremity edema, fatigue, palpitations, melena, hematuria, hemoptysis, diaphoresis, weakness, presyncope, syncope, orthopnea, and PND.  All other systems are reviewed and otherwise negative. Studies Reviewed: SABRA    EKG:  EKG is ordered today, personally reviewed, demonstrating  EKG Interpretation Date/Time:  Monday April 07 2024 08:48:05 EDT Ventricular Rate:  88 PR Interval:  180 QRS Duration:  108 QT Interval:  390 QTC Calculation: 471 R Axis:   23  Text Interpretation: Normal sinus rhythm Normal ECG When compared with ECG of 25-Nov-2020 15:42, No significant change was found Confirmed by Melessa Cowell [phone]) on 04/07/2024 8:52:12 AM   CV Studies: Cardiac studies reviewed are outlined and summarized above. Otherwise please see EMR for full report. Cardiac Studies & Procedures   ______________________________________________________________________________________________ CARDIAC CATHETERIZATION  CARDIAC CATHETERIZATION 11/26/2020  Conclusion Normal coronary arteries with a dominant left circumflex system.  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  RECOMMENDATION: Guideline directed medical therapy for HFrEF.  Smoking cessation.  Findings Coronary Findings Diagnostic  Dominance: Left  No diagnostic findings have been documented. Intervention  No interventions have been documented.     ECHOCARDIOGRAM  ECHOCARDIOGRAM COMPLETE 01/12/2022  Narrative ECHOCARDIOGRAM REPORT    Patient Name:   Cody Carney Date of Exam: 01/12/2022 Medical Rec #:  [id number]  Height:       68.0 in Accession #:    [id number] Weight:       224.0 lb Date of Birth:  1961-06-13  BSA:          2.145 m Patient Age:    60 years   BP:            118/70 mmHg Patient Gender: M          HR:           72 bpm. Exam Location:  [place]  Procedure: 2D Echo, Cardiac Doppler, Color Doppler and Strain Analysis  Indications:    HFrEF (heart failure with reduced ejection fraction) (HCC) [I50.20 (ICD-10-CM)]; Aortic atherosclerosis (HCC) [I70.0 (ICD-10-CM)]  History:        Patient has prior history of Echocardiogram examinations, most recent 08/29/2021. Coronary artery calcification, Aortic atherosclerosis; Risk Factors:Hypertension and Current Smoker.  Sonographer:    Lynwood Silvas RDCS Referring Phys: [id number] [facility] A CHANDRASEKHAR  IMPRESSIONS   1. Improved LVEF - was 35-40% (08/2021), now 45-50%. MR from moderate decreased now to mild. GLS -11.9. Left ventricular ejection fraction, by estimation, is 45 to 50%. The left ventricle has mildly decreased function. The left ventricle has no regional wall motion abnormalities. Left ventricular diastolic parameters are consistent with Grade II diastolic dysfunction (pseudonormalization). 2. Right ventricular systolic function is normal. The right ventricular size is normal. There is normal pulmonary artery systolic pressure. 3. The mitral valve is normal in structure. Mild mitral valve regurgitation. No evidence of mitral stenosis. 4. The aortic valve is normal in structure. Aortic valve regurgitation is not visualized. No aortic stenosis is present. 5. The inferior vena cava is normal in size with greater than 50% respiratory variability, suggesting right atrial pressure of 3 mmHg.  FINDINGS Left Ventricle: Improved LVEF - was 35-40% (08/2021), now 45-50%. MR from moderate decreased now to mild. GLS -11.9. Left ventricular ejection fraction, by estimation, is 45 to 50%. The left ventricle has mildly decreased function. The left ventricle has no regional wall motion abnormalities. The left ventricular internal cavity size was normal in size. There is borderline left ventricular hypertrophy. Left  ventricular diastolic parameters are consistent with Grade II diastolic dysfunction (pseudonormalization).  Right Ventricle: The right ventricular size is normal. No increase in right ventricular wall thickness. Right ventricular systolic function is normal. There is normal pulmonary artery systolic pressure. The tricuspid regurgitant velocity is 1.70 m/s, and with an assumed right atrial pressure of 3 mmHg, the estimated right ventricular systolic pressure is 14.6 mmHg.  Left Atrium: Left atrial size was normal in size.  Right Atrium: Right atrial size was normal in size.  Pericardium: There is no evidence of pericardial effusion.  Mitral Valve: The mitral valve is normal in structure. Mild mitral valve regurgitation. No evidence of mitral valve stenosis.  Tricuspid Valve: The tricuspid valve is normal in structure. Tricuspid valve regurgitation is not demonstrated. No evidence of tricuspid stenosis.  Aortic Valve: The aortic valve is normal in structure. Aortic valve regurgitation is not visualized. No aortic stenosis is present.  Pulmonic Valve: The pulmonic valve was normal in structure. Pulmonic valve regurgitation is not visualized. No evidence of pulmonic stenosis.  Aorta: The aortic root is normal in size and structure.  Venous: The inferior vena cava is normal in size with greater than 50% respiratory variability, suggesting right atrial pressure of 3 mmHg.  IAS/Shunts: No atrial level shunt detected by color flow Doppler.   LEFT VENTRICLE PLAX 2D  LVIDd:         5.50 cm      Diastology LVIDs:         4.60 cm      LV e' medial:    5.87 cm/s LV PW:         1.10 cm      LV E/e' medial:  14.9 LV IVS:        1.10 cm      LV e' lateral:   8.05 cm/s LVOT diam:     2.10 cm      LV E/e' lateral: 10.9 LV SV:         60 LV SV Index:   28 LVOT Area:     3.46 cm  LV Volumes (MOD) LV vol d, MOD A2C: 115.0 ml LV vol d, MOD A4C: 125.0 ml LV vol s, MOD A2C: 60.1 ml LV vol s, MOD  A4C: 71.3 ml LV SV MOD A2C:     54.9 ml LV SV MOD A4C:     125.0 ml LV SV MOD BP:      57.6 ml  RIGHT VENTRICLE            IVC RV S prime:     9.14 cm/s  IVC diam: 1.60 cm TAPSE (M-mode): 2.5 cm  LEFT ATRIUM             Index        RIGHT ATRIUM           Index LA diam:        3.90 cm 1.82 cm/m   RA Area:     15.40 cm LA Vol (A2C):   50.0 ml 23.31 ml/m  RA Volume:   40.10 ml  18.70 ml/m LA Vol (A4C):   47.6 ml 22.20 ml/m LA Biplane Vol: 52.6 ml 24.53 ml/m AORTIC VALVE LVOT Vmax:   80.30 cm/s LVOT Vmean:  58.300 cm/s LVOT VTI:    0.172 m  AORTA Ao Root diam: 3.10 cm Ao Asc diam:  3.35 cm Ao Desc diam: 2.00 cm  MITRAL VALVE               TRICUSPID VALVE MV Area (PHT): 3.11 cm    TR Peak grad:   11.6 mmHg MV Decel Time: 244 msec    TR Vmax:        170.00 cm/s MV E velocity: 87.40 cm/s MV A velocity: 92.10 cm/s  SHUNTS MV E/A ratio:  0.95        Systemic VTI:  0.17 m Systemic Diam: 2.10 cm  Lamar Fitch MD Electronically signed by Lamar Fitch MD Signature Date/Time: 01/12/2022/4:02:06 PM    Final          ______________________________________________________________________________________________       Current Reported Medications:.    Current Meds  Medication Sig   albuterol  (VENTOLIN  HFA) 108 (90 Base) MCG/ACT inhaler Inhale 2 puffs into the lungs as needed.   aspirin  EC 81 MG tablet Take 1 tablet (81 mg total) by mouth daily. Swallow whole. Take for 4 wks then stop   atorvastatin  (LIPITOR ) 80 MG tablet Take 80 mg by mouth at bedtime.   carvedilol  (COREG ) 12.5 MG tablet Take 12.5 mg by mouth 2 (two) times daily with a meal.   cetirizine (ZYRTEC) 10 MG tablet Take 10 mg by mouth daily.   Cholecalciferol  (VITAMIN D3) 1000 units CAPS Take 1,000 Units by mouth daily.   clopidogrel  (PLAVIX ) 75 MG tablet Take 75 mg  by mouth daily.   dapagliflozin  propanediol (FARXIGA ) 10 MG TABS tablet Take 1 tablet (10 mg total) by mouth daily.   ezetimibe (ZETIA)  10 MG tablet Take 10 mg by mouth daily.   furosemide  (LASIX ) 80 MG tablet Take 2 tablets (160 mg total) by mouth daily.   gabapentin  (NEURONTIN ) 300 MG capsule Take 300 mg by mouth at bedtime.   metFORMIN  (GLUCOPHAGE ) 500 MG tablet Take 500 mg by mouth 2 (two) times daily with a meal.   omeprazole (PRILOSEC) 20 MG capsule Take 20 mg by mouth daily.   potassium chloride  (KLOR-CON ) 10 MEQ tablet Take 1 tablet (10 mEq total) by mouth daily.   sildenafil  (VIAGRA ) 50 MG tablet Take 1 tablet (50 mg total) by mouth daily as needed for erectile dysfunction.   spironolactone  (ALDACTONE ) 25 MG tablet Take 12.5 mg by mouth at bedtime.   Testosterone 1.62 % GEL SMARTSIG:1 pump Topical Every Morning   Current Facility-Administered Medications for the 04/07/24 encounter (Office Visit) with Judaea Burgoon D, NP  Medication   ondansetron  (ZOFRAN ) 4 mg in sodium chloride  0.9 % 50 mL IVPB   Physical Exam:    VS:  BP 106/68   Pulse 88   Ht 5' 8 (1.727 m)   Wt 223 lb 12.8 oz (101.5 kg)   SpO2 95%   BMI 34.03 kg/m    Wt Readings from Last 3 Encounters:  04/07/24 223 lb 12.8 oz (101.5 kg)  07/18/23 210 lb (95.3 kg)  07/06/23 210 lb (95.3 kg)    GEN: Well nourished, well developed in no acute distress NECK: No JVD; No carotid bruits CARDIAC: RRR, no murmurs, rubs, gallops RESPIRATORY:  Clear to auscultation without rales, wheezing or rhonchi  ABDOMEN: Soft, non-tender, non-distended EXTREMITIES:  No edema; No acute deformity     Asessement and Plan:.    Preoperative cardaic evaluation: Patient currently pending colonoscopy. Mr. Cuthrell perioperative risk of a major cardiac event is 6.6% according to the Revised Cardiac Risk Index (RCRI).  Therefore, he is at moderate risk for perioperative complications.   His functional capacity is good at 7.99 METs according to the Duke Activity Status Index (DASI).Recommendations: According to ACC/AHA guidelines, no further cardiovascular testing needed.  The patient  may proceed to surgery at acceptable risk.  Antiplatelet and/or Anticoagulation Recommendations: Patient takes aspirin  and Plavix  for history of TIA, recommendations regarding holding should come from his PCP as she has been managing. From a cardiac standpoint there is no contraindication to holding these medications.  A. Echocardiogram is for routine monitoring and should not preclude colonoscopy.   Heart failure with reduced ejection fraction: Nonischemic cardiomyopathy.  LHC in 2022 with normal coronary arteries with a dominant left circumflex symptom.  Echocardiogram in 2023 indicating improvement in patient's LVEF from 30-35% to 45%.  Patient is doing well overall, noted some increased shortness of breath related to allergies, has since resolved.  He does endorse intermittent lower extremity edema if he forgets to take his medications, with taking typically will quickly resolve.  He appears euvolemic and well compensated on exam today.  Patient agreeable to echocardiogram for monitoring, this does not need to preclude his colonoscopy.  Encouraged patient to monitor his daily weights at home and notify the office of weight gain of 2 to 3 pounds overnight or 5 pounds in weight, increase lower extremity edema or shortness of breath.  Continue carvedilol  12.5 mg twice daily, Farxiga  10 mg daily, Lasix  daily, spironolactone  12.5 mg daily.  Patient was previously  on Entresto  however was discontinued in setting of hypotension.    Aortic atherosclerosis/CAD: LHC in 11/2020 with normal coronary arteries with a dominant left circumflex system. Stable with no anginal symptoms. No indication for ischemic evaluation.  Heart healthy diet and regular cardiovascular exercise encouraged. On Plavix  75 mg daily, carvedilol  12.5 mg twice daily, spironolactone  12.5 mg daily.  HLD: Unable to view patient's most recent lipid profile, will request from PCPs office.  Patient's LDL goal is less than 70.  He is currently on  atorvastatin  80 mg daily and Zetia 10 mg daily.  History of TIA: Patient denies any recent recurrence.  He is currently on Plavix , also on aspirin  per PCP.  Tobacco use: Patient reports that he currently smokes a pack every 2 days. Complete cessation encouraged.     Disposition: F/u with Dr. Santo in 6 months or sooner if needed.   Signed, Layani Foronda D Taneasha Fuqua, NP

## 2024-04-07 ENCOUNTER — Encounter: Payer: Self-pay | Admitting: Cardiology

## 2024-04-07 ENCOUNTER — Ambulatory Visit: Attending: Cardiology | Admitting: Cardiology

## 2024-04-07 VITALS — BP 106/68 | HR 88 | Ht 68.0 in | Wt 223.8 lb

## 2024-04-07 DIAGNOSIS — I2510 Atherosclerotic heart disease of native coronary artery without angina pectoris: Secondary | ICD-10-CM | POA: Diagnosis not present

## 2024-04-07 DIAGNOSIS — I700 Atherosclerosis of aorta: Secondary | ICD-10-CM

## 2024-04-07 DIAGNOSIS — Z01810 Encounter for preprocedural cardiovascular examination: Secondary | ICD-10-CM | POA: Diagnosis not present

## 2024-04-07 DIAGNOSIS — Z720 Tobacco use: Secondary | ICD-10-CM

## 2024-04-07 DIAGNOSIS — Z8673 Personal history of transient ischemic attack (TIA), and cerebral infarction without residual deficits: Secondary | ICD-10-CM

## 2024-04-07 DIAGNOSIS — R600 Localized edema: Secondary | ICD-10-CM

## 2024-04-07 DIAGNOSIS — I5020 Unspecified systolic (congestive) heart failure: Secondary | ICD-10-CM | POA: Diagnosis not present

## 2024-04-07 DIAGNOSIS — Z01818 Encounter for other preprocedural examination: Secondary | ICD-10-CM

## 2024-04-07 NOTE — Patient Instructions (Addendum)
 Medication Instructions:  No changes *If you need a refill on your cardiac medications before your next appointment, please call your pharmacy*  Lab Work: No labs  Testing/Procedures: Your physician has requested that you have an echocardiogram. Echocardiography is a painless test that uses sound waves to create images of your heart. It provides your doctor with information about the size and shape of your heart and how well your heart's chambers and valves are working. This procedure takes approximately one hour. There are no restrictions for this procedure. Please do NOT wear cologne, perfume, aftershave, or lotions (deodorant is allowed). Please arrive 15 minutes prior to your appointment time.  Please note: We ask at that you not bring children with you during ultrasound (echo/ vascular) testing. Due to room size and safety concerns, children are not allowed in the ultrasound rooms during exams. Our front office staff cannot provide observation of children in our lobby area while testing is being conducted. An adult accompanying a patient to their appointment will only be allowed in the ultrasound room at the discretion of the ultrasound technician under special circumstances. We apologize for any inconvenience.  Follow-Up: At [facility], you and your health needs are our priority.  As part of our continuing mission to provide you with exceptional heart care, our providers are all part of one team.  This team includes your primary Cardiologist (physician) and Advanced Practice Providers or APPs (Physician Assistants and Nurse Practitioners) who all work together to provide you with the care you need, when you need it.  Your next appointment:   6 month(s)  Provider:   Stanly DELENA Leavens, MD    We recommend signing up for the patient portal called MyChart.  Sign up information is provided on this After Visit Summary.  MyChart is used to connect with patients for Virtual  Visits (Telemedicine).  Patients are able to view lab/test results, encounter notes, upcoming appointments, etc.  Non-urgent messages can be sent to your provider as well.   To learn more about what you can do with MyChart, go to [email or web address].

## 2024-05-22 ENCOUNTER — Telehealth (HOSPITAL_COMMUNITY): Payer: Self-pay | Admitting: Cardiology

## 2024-05-22 NOTE — Telephone Encounter (Signed)
 Patient called and cancelled echocardiogram for reason below:   05/22/2024 9:55 AM Ab:FNNMZ, SARAH G  Cancel Rsn: Patient (pt sick - will call back to reschedule)   Order will be removed from the echo WQ. If patient calls back we will reinstate the order. Thank you.

## 2024-05-23 ENCOUNTER — Ambulatory Visit (HOSPITAL_COMMUNITY)

## 2024-09-12 ENCOUNTER — Ambulatory Visit (HOSPITAL_COMMUNITY): Attending: Cardiology

## 2024-09-12 ENCOUNTER — Telehealth (HOSPITAL_COMMUNITY): Payer: Self-pay | Admitting: Cardiology

## 2024-09-12 NOTE — Telephone Encounter (Signed)
 Patient NO SHOWED Echocardiogram on 09/12/24. We will not contact patient to reschedule due to NO SHOW RATE 17%.  Order will be removed from the active echo and if patient calls back we will reinstate the order. Thank you.

## 2024-10-06 ENCOUNTER — Encounter (HOSPITAL_BASED_OUTPATIENT_CLINIC_OR_DEPARTMENT_OTHER): Payer: Self-pay

## 2024-10-07 ENCOUNTER — Ambulatory Visit: Admitting: Internal Medicine

## 2024-10-07 NOTE — Progress Notes (Deleted)
 " Cardiology Office Note:  .    Date:  10/07/2024  ID:  Cody Carney, DOB Jul 12, 1961, MRN [id number] PCP: Cody Elston NOVAK, NP  Cody Carney Cardiologist:  Cody DELENA Leavens, MD { Click to update primary MD,subspecialty MD or APP then REFRESH:1}    CC: ***   History of Present Illness: .    Cody Carney is a 63 y.o. male a history of Non-obstructive CAD, Aortic atherosclerosis and HLD, HFrEF, COPD with former tobacco use   Discussed the use of AI scribe software for clinical note transcription with the patient, who gave verbal consent to proceed.   Relevant histories: .  Social  2021: Established with me 2022: Lost to f/u (social issues) 2023: LV recovery 2024: APP f/u 2025: APP f/u (Pre-op) NYHA I DYAD Care: Katlyn West, NP  ROS: As per HPI.   Studies Reviewed: .     Cardiac Studies & Procedures   ______________________________________________________________________________________________ CARDIAC CATHETERIZATION  CARDIAC CATHETERIZATION 11/26/2020  Conclusion Normal coronary arteries with a dominant left circumflex system.  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  RECOMMENDATION: Guideline directed medical therapy for HFrEF.  Smoking cessation.  Findings Coronary Findings Diagnostic  Dominance: Left  No diagnostic findings have been documented. Intervention  No interventions have been documented.     ECHOCARDIOGRAM  ECHOCARDIOGRAM COMPLETE 01/12/2022  Narrative ECHOCARDIOGRAM REPORT    Patient Name:   Cody Carney Date of Exam: 01/12/2022 Medical Rec #:  [id number]  Height:       68.0 in Accession #:    [id number] Weight:       224.0 lb Date of Birth:  Dec 03, 1960  BSA:          2.145 m Patient Age:    60 years   BP:           118/70 mmHg Patient Gender: M          HR:           72 bpm. Exam Location:    Procedure: 2D Echo, Cardiac Doppler, Color Doppler and Strain  Analysis  Indications:    HFrEF (heart failure with reduced ejection fraction) (HCC) [I50.20 (ICD-10-CM)]; Aortic atherosclerosis (HCC) [I70.0 (ICD-10-CM)]  History:        Patient has prior history of Echocardiogram examinations, most recent 08/29/2021. Coronary artery calcification, Aortic atherosclerosis; Risk Factors:Hypertension and Current Smoker.  Sonographer:    Lynwood Silvas RDCS Referring Phys: [id number] [facility] A Phyllicia Dudek  IMPRESSIONS   1. Improved LVEF - was 35-40% (08/2021), now 45-50%. MR from moderate decreased now to mild. GLS -11.9. Left ventricular ejection fraction, by estimation, is 45 to 50%. The left ventricle has mildly decreased function. The left ventricle has no regional wall motion abnormalities. Left ventricular diastolic parameters are consistent with Grade II diastolic dysfunction (pseudonormalization). 2. Right ventricular systolic function is normal. The right ventricular size is normal. There is normal pulmonary artery systolic pressure. 3. The mitral valve is normal in structure. Mild mitral valve regurgitation. No evidence of mitral stenosis. 4. The aortic valve is normal in structure. Aortic valve regurgitation is not visualized. No aortic stenosis is present. 5. The inferior vena cava is normal in size with greater than 50% respiratory variability, suggesting right atrial pressure of 3 mmHg.  FINDINGS Left Ventricle: Improved LVEF - was 35-40% (08/2021), now 45-50%. MR from moderate decreased now to mild. GLS -11.9. Left ventricular ejection fraction, by estimation, is 45 to 50%. The left ventricle has mildly  decreased function. The left ventricle has no regional wall motion abnormalities. The left ventricular internal cavity size was normal in size. There is borderline left ventricular hypertrophy. Left ventricular diastolic parameters are consistent with Grade II diastolic dysfunction (pseudonormalization).  Right Ventricle: The right ventricular size  is normal. No increase in right ventricular wall thickness. Right ventricular systolic function is normal. There is normal pulmonary artery systolic pressure. The tricuspid regurgitant velocity is 1.70 m/s, and with an assumed right atrial pressure of 3 mmHg, the estimated right ventricular systolic pressure is 14.6 mmHg.  Left Atrium: Left atrial size was normal in size.  Right Atrium: Right atrial size was normal in size.  Pericardium: There is no evidence of pericardial effusion.  Mitral Valve: The mitral valve is normal in structure. Mild mitral valve regurgitation. No evidence of mitral valve stenosis.  Tricuspid Valve: The tricuspid valve is normal in structure. Tricuspid valve regurgitation is not demonstrated. No evidence of tricuspid stenosis.  Aortic Valve: The aortic valve is normal in structure. Aortic valve regurgitation is not visualized. No aortic stenosis is present.  Pulmonic Valve: The pulmonic valve was normal in structure. Pulmonic valve regurgitation is not visualized. No evidence of pulmonic stenosis.  Aorta: The aortic root is normal in size and structure.  Venous: The inferior vena cava is normal in size with greater than 50% respiratory variability, suggesting right atrial pressure of 3 mmHg.  IAS/Shunts: No atrial level shunt detected by color flow Doppler.   LEFT VENTRICLE PLAX 2D LVIDd:         5.50 cm      Diastology LVIDs:         4.60 cm      LV e' medial:    5.87 cm/s LV PW:         1.10 cm      LV E/e' medial:  14.9 LV IVS:        1.10 cm      LV e' lateral:   8.05 cm/s LVOT diam:     2.10 cm      LV E/e' lateral: 10.9 LV SV:         60 LV SV Index:   28 LVOT Area:     3.46 cm  LV Volumes (MOD) LV vol d, MOD A2C: 115.0 ml LV vol d, MOD A4C: 125.0 ml LV vol s, MOD A2C: 60.1 ml LV vol s, MOD A4C: 71.3 ml LV SV MOD A2C:     54.9 ml LV SV MOD A4C:     125.0 ml LV SV MOD BP:      57.6 ml  RIGHT VENTRICLE            IVC RV S prime:     9.14  cm/s  IVC diam: 1.60 cm TAPSE (M-mode): 2.5 cm  LEFT ATRIUM             Index        RIGHT ATRIUM           Index LA diam:        3.90 cm 1.82 cm/m   RA Area:     15.40 cm LA Vol (A2C):   50.0 ml 23.31 ml/m  RA Volume:   40.10 ml  18.70 ml/m LA Vol (A4C):   47.6 ml 22.20 ml/m LA Biplane Vol: 52.6 ml 24.53 ml/m AORTIC VALVE LVOT Vmax:   80.30 cm/s LVOT Vmean:  58.300 cm/s LVOT VTI:    0.172 m  AORTA Ao  Root diam: 3.10 cm Ao Asc diam:  3.35 cm Ao Desc diam: 2.00 cm  MITRAL VALVE               TRICUSPID VALVE MV Area (PHT): 3.11 cm    TR Peak grad:   11.6 mmHg MV Decel Time: 244 msec    TR Vmax:        170.00 cm/s MV E velocity: 87.40 cm/s MV A velocity: 92.10 cm/s  SHUNTS MV E/A ratio:  0.95        Systemic VTI:  0.17 m Systemic Diam: 2.10 cm  Lamar Fitch MD Electronically signed by Lamar Fitch MD Signature Date/Time: 01/12/2022/4:02:06 PM    Final          ______________________________________________________________________________________________        Risk Assessment/Calculations:    {Does this patient have ATRIAL FIBRILLATION?[phone]  {This patient may be at risk for Amyloid. He has one or more dx on the prob list or PMH from the following list -  Abnormal EKG, HFpEF/Diastolic CHF, Aortic Stenosis, LVH, Bilateral Carpal Tunnel Syndrome, Biceps Tendon Rupture, Spinal Stenosis, Pericardial Effusion, Left Atrial Enlargement, Conduction System Disorder. See list below or review PMH.  Diagnoses From Problem List           Noted     Bilateral carpal tunnel syndrome 12/05/2022    Click HERE to open Cardiac Amyloid Screening SmartSet to order screening OR Click HERE to defer testing for 1 year or permanently :1}    Physical Exam:    VS:  There were no vitals taken for this visit.   Wt Readings from Last 3 Encounters:  04/07/24 223 lb 12.8 oz (101.5 kg)  07/18/23 210 lb (95.3 kg)  07/06/23 210 lb (95.3 kg)    Gen: *** distress, ***  obese/well nourished/malnourished   Neck: No JVD, *** carotid bruit Ears: *** Frank Sign Cardiac: No Rubs or Gallops, *** Murmur, ***cardia, *** radial pulses Respiratory: Clear to auscultation bilaterally, *** effort, ***  respiratory rate GI: Soft, nontender, non-distended *** MS: No *** edema; *** moves all extremities Integument: Skin feels *** Neuro:  At time of evaluation, alert and oriented to person/place/time/situation *** Psych: Normal affect, patient feels ***  No BP recorded.  {Refresh Note OR Click here to enter BP  :1}***    ASSESSMENT AND PLAN: .    *** An EKG was ordered for *** and shows ***  Cody Leavens, MD FASE [facility] Cardiologist [facility]  [address] [place], [address] [place], [place] [address] [phone]  9:33 AM  "

## 2024-10-10 ENCOUNTER — Encounter: Payer: Self-pay | Admitting: Internal Medicine
